# Patient Record
Sex: Female | Born: 1965 | Race: White | Hispanic: No | Smoking: Current every day smoker
Health system: Southern US, Community
[De-identification: ages and names within clinical notes are randomized; demographics above are authoritative.]

## PROBLEM LIST (undated history)

## (undated) DIAGNOSIS — T7840XA Allergy, unspecified, initial encounter: Secondary | ICD-10-CM

## (undated) DIAGNOSIS — H409 Unspecified glaucoma: Secondary | ICD-10-CM

## (undated) DIAGNOSIS — J449 Chronic obstructive pulmonary disease, unspecified: Secondary | ICD-10-CM

## (undated) DIAGNOSIS — M1990 Unspecified osteoarthritis, unspecified site: Secondary | ICD-10-CM

## (undated) DIAGNOSIS — R251 Tremor, unspecified: Secondary | ICD-10-CM

## (undated) HISTORY — DX: Unspecified glaucoma: H40.9

## (undated) HISTORY — PX: FOOT SURGERY: SHX648

## (undated) HISTORY — DX: Chronic obstructive pulmonary disease, unspecified: J44.9

## (undated) HISTORY — DX: Unspecified osteoarthritis, unspecified site: M19.90

## (undated) HISTORY — PX: ABDOMINAL HYSTERECTOMY: SHX81

## (undated) HISTORY — DX: Tremor, unspecified: R25.1

## (undated) HISTORY — PX: FOOT FUSION: SHX956

## (undated) HISTORY — PX: REFRACTIVE SURGERY: SHX103

## (undated) HISTORY — DX: Allergy, unspecified, initial encounter: T78.40XA

## (undated) MED FILL — Medication: Fill #3 | Status: CN

## (undated) MED FILL — Medication: Fill #0 | Status: CN

---

## 2004-05-06 ENCOUNTER — Ambulatory Visit: Payer: Self-pay | Admitting: Internal Medicine

## 2004-09-29 ENCOUNTER — Ambulatory Visit: Payer: Self-pay

## 2004-11-12 ENCOUNTER — Emergency Department: Payer: Self-pay | Admitting: Internal Medicine

## 2004-11-29 ENCOUNTER — Ambulatory Visit: Payer: Self-pay | Admitting: Internal Medicine

## 2006-12-27 ENCOUNTER — Emergency Department: Payer: Self-pay | Admitting: Emergency Medicine

## 2006-12-27 ENCOUNTER — Other Ambulatory Visit: Payer: Self-pay

## 2007-11-07 ENCOUNTER — Ambulatory Visit: Payer: Self-pay | Admitting: Internal Medicine

## 2014-02-03 DIAGNOSIS — R252 Cramp and spasm: Secondary | ICD-10-CM | POA: Insufficient documentation

## 2014-02-03 HISTORY — DX: Cramp and spasm: R25.2

## 2014-09-30 ENCOUNTER — Emergency Department
Admit: 2014-09-30 | Disposition: A | Discharge status: Home / Self Care | Payer: Self-pay | Admitting: Emergency Medicine

## 2014-09-30 LAB — BASIC METABOLIC PANEL WITH GFR
Anion Gap: 8
BUN: 9 mg/dL
Calcium, Total: 9.2 mg/dL
Chloride: 104 mmol/L
Co2: 26 mmol/L
Creatinine: 0.75 mg/dL
EGFR (African American): >60
EGFR (Non-African Amer.): >60
Glucose: 128 mg/dL — ABNORMAL HIGH
Potassium: 3.7 mmol/L
Sodium: 138 mmol/L

## 2014-09-30 LAB — CBC
HCT: 43.1 % (ref 35.0–47.0)
HGB: 14.8 g/dL (ref 12.0–16.0)
MCH: 32.2 pg (ref 26.0–34.0)
MCHC: 34.2 g/dL (ref 32.0–36.0)
MCV: 94 fL (ref 80–100)
PLATELETS: 328 10*3/uL (ref 150–440)
RBC: 4.59 10*6/uL (ref 3.80–5.20)
RDW: 13.4 % (ref 11.5–14.5)
WBC: 9.8 10*3/uL (ref 3.6–11.0)

## 2015-02-11 DIAGNOSIS — F17210 Nicotine dependence, cigarettes, uncomplicated: Secondary | ICD-10-CM | POA: Insufficient documentation

## 2015-02-11 DIAGNOSIS — M1710 Unilateral primary osteoarthritis, unspecified knee: Secondary | ICD-10-CM | POA: Insufficient documentation

## 2015-02-11 DIAGNOSIS — F17200 Nicotine dependence, unspecified, uncomplicated: Secondary | ICD-10-CM | POA: Insufficient documentation

## 2018-01-05 ENCOUNTER — Emergency Department: Payer: Self-pay

## 2018-01-05 ENCOUNTER — Other Ambulatory Visit: Payer: Self-pay

## 2018-01-05 ENCOUNTER — Emergency Department
Admission: EM | Admit: 2018-01-05 | Discharge: 2018-01-05 | Disposition: A | Discharge status: Home / Self Care | Payer: Self-pay | Attending: Emergency Medicine | Admitting: Emergency Medicine

## 2018-01-05 DIAGNOSIS — R630 Anorexia: Secondary | ICD-10-CM | POA: Insufficient documentation

## 2018-01-05 DIAGNOSIS — R109 Unspecified abdominal pain: Secondary | ICD-10-CM | POA: Insufficient documentation

## 2018-01-05 DIAGNOSIS — F17200 Nicotine dependence, unspecified, uncomplicated: Secondary | ICD-10-CM | POA: Insufficient documentation

## 2018-01-05 LAB — URINALYSIS, COMPLETE (UACMP) WITH MICROSCOPIC
Bacteria, UA: NONE SEEN
Glucose, UA: NEGATIVE mg/dL
Hgb urine dipstick: NEGATIVE
Ketones, ur: 5 mg/dL — AB
Nitrite: NEGATIVE
Protein, ur: 30 mg/dL — AB
SPECIFIC GRAVITY, URINE: 1.034 — AB (ref 1.005–1.030)
pH: 5 (ref 5.0–8.0)

## 2018-01-05 LAB — DIFFERENTIAL
Basophils Absolute: 0 10*3/uL (ref 0–0.1)
Basophils Relative: 0 %
Eosinophils Absolute: 0.1 10*3/uL (ref 0–0.7)
Eosinophils Relative: 1 %
LYMPHS ABS: 1.2 10*3/uL (ref 1.0–3.6)
LYMPHS PCT: 10 %
Monocytes Absolute: 0.7 10*3/uL (ref 0.2–0.9)
Monocytes Relative: 6 %
NEUTROS ABS: 10.5 10*3/uL — AB (ref 1.4–6.5)
Neutrophils Relative %: 83 %

## 2018-01-05 LAB — COMPREHENSIVE METABOLIC PANEL
ALBUMIN: 4.2 g/dL (ref 3.5–5.0)
ALT: 19 U/L (ref 0–44)
AST: 22 U/L (ref 15–41)
Alkaline Phosphatase: 68 U/L (ref 38–126)
Anion gap: 10 (ref 5–15)
BUN: 10 mg/dL (ref 6–20)
CALCIUM: 9 mg/dL (ref 8.9–10.3)
CO2: 23 mmol/L (ref 22–32)
Chloride: 107 mmol/L (ref 98–111)
Creatinine, Ser: 0.72 mg/dL (ref 0.44–1.00)
GFR calc Af Amer: >60 mL/min (ref >60)
GFR calc non Af Amer: >60 mL/min (ref >60)
GLUCOSE: 113 mg/dL — AB (ref 70–99)
POTASSIUM: 3.5 mmol/L (ref 3.5–5.1)
Sodium: 140 mmol/L (ref 135–145)
Total Bilirubin: 0.7 mg/dL (ref 0.3–1.2)
Total Protein: 7.1 g/dL (ref 6.5–8.1)

## 2018-01-05 LAB — TROPONIN I

## 2018-01-05 LAB — CBC
HCT: 43.5 % (ref 35.0–47.0)
HEMOGLOBIN: 15.2 g/dL (ref 12.0–16.0)
MCH: 33.2 pg (ref 26.0–34.0)
MCHC: 34.9 g/dL (ref 32.0–36.0)
MCV: 95 fL (ref 80.0–100.0)
Platelets: 326 10*3/uL (ref 150–440)
RBC: 4.58 MIL/uL (ref 3.80–5.20)
RDW: 13.3 % (ref 11.5–14.5)
WBC: 12.7 10*3/uL — ABNORMAL HIGH (ref 3.6–11.0)

## 2018-01-05 LAB — LIPASE, BLOOD: Lipase: 21 U/L (ref 11–51)

## 2018-01-05 MED ORDER — IOPAMIDOL (ISOVUE-300) INJECTION 61%: 30.0000 mL | Freq: Once | INTRAVENOUS | Status: DC | PRN

## 2018-01-05 NOTE — ED Notes (Signed)
FIRST NURSE NOTE:  Pt c/o dehdyration and decreased appetite.  Ambulatory in triage. No distress noted.

## 2018-01-05 NOTE — ED Provider Notes (Addendum)
[facility] Emergency Department Provider Note   ____________________________________________   First MD Initiated Contact with Patient 01/05/18 1134     (approximate)  I have reviewed the triage vital signs and the nursing notes.   HISTORY  Chief Complaint Abdominal Pain   HPI Natasha Hanson is a 52 y.o. female who saw her doctor at [facility] yesterday.  Doctor told her she had some blood in her urine was dehydrated and the patient states she was told she had a gallbladder infection.  The paperwork the patient brought with her does not seem to indicate that though.  Patient reports she is not hungry not eating losing weight feels a little nausea and her belly is diffusely tender on exam.  Tenderness is mild to moderate worse with palpation.  Is been going on for a while.   History reviewed. No pertinent past medical history.  There are no active problems to display for this patient.   Past Surgical History:  Procedure Laterality Date  . ABDOMINAL HYSTERECTOMY    . FOOT SURGERY    . REFRACTIVE SURGERY      Prior to Admission medications   Not on File    Allergies Patient has no known allergies.  History reviewed. No pertinent family history.  Social History Social History   Tobacco Use  . Smoking status: Current Every Day Smoker  Substance Use Topics  . Alcohol use: Never    Frequency: Never  . Drug use: Not on file    Review of Systems  Constitutional: No fever/chills Eyes: No visual changes. ENT: No sore throat. Cardiovascular: Denies chest pain. Respiratory: Denies shortness of breath. Gastrointestinal:  abdominal pain.   nausea, no vomiting.  No diarrhea.  No constipation. Genitourinary: Negative for dysuria. Musculoskeletal: Negative for back pain. Skin: Negative for rash. Neurological: Negative for headaches, focal weakness   ____________________________________________   PHYSICAL EXAM:  VITAL  SIGNS: ED Triage Vitals [01/05/18 1114]  Enc Vitals Group     BP 126/74     Pulse Rate 82     Resp 18     Temp 98.6 F (37 C)     Temp Source Oral     SpO2 98 %     Weight 140 lb (63.5 kg)     Height 5\' 3"  (1.6 m)     Head Circumference      Peak Flow      Pain Score 5     Pain Loc      Pain Edu?      Excl. in GC?     Constitutional: Alert and oriented. Well appearing and in no acute distress. Eyes: Conjunctivae are normal. PER. EOMI. Head: Atraumatic. Nose: No congestion/rhinnorhea. Mouth/Throat: Mucous membranes are moist.  Oropharynx non-erythematous. Neck: No stridor. Cardiovascular: Normal rate, regular rhythm. Grossly normal heart sounds.  Good peripheral circulation. Respiratory: Normal respiratory effort.  No retractions. Lungs CTAB. Gastrointestinal: Soft usually tender to palpation no distention. No abdominal bruits. No CVA tenderness. Musculoskeletal: No lower extremity tenderness nor edema.  No joint effusions. Neurologic:  Normal speech and language. No gross focal neurologic deficits are appreciated. No gait instability. Skin:  Skin is warm, dry and intact. No rash noted. Psychiatric: Mood and affect are normal. Speech and behavior are normal.  ____________________________________________   LABS (all labs ordered are listed, but only abnormal results are displayed)  Labs Reviewed  COMPREHENSIVE METABOLIC PANEL - Abnormal; Notable for the following components:  Result Value   Glucose, Bld 113 (*)    All other components within normal limits  CBC - Abnormal; Notable for the following components:   WBC 12.7 (*)    All other components within normal limits  URINALYSIS, COMPLETE (UACMP) WITH MICROSCOPIC - Abnormal; Notable for the following components:   Color, Urine AMBER (*)    APPearance CLEAR (*)    Specific Gravity, Urine 1.034 (*)    Bilirubin Urine SMALL (*)    Ketones, ur 5 (*)    Protein, ur 30 (*)    Leukocytes, UA TRACE (*)    All other  components within normal limits  DIFFERENTIAL - Abnormal; Notable for the following components:   Neutro Abs 10.5 (*)    All other components within normal limits  LIPASE, BLOOD  TROPONIN I   ____________________________________________  EKG EKG read and interpreted by me shows 5 there is some ST flattening inferiorly and in the chest leads.  Patient does not have symptoms of increasing pain or shortness of breath with exertion.  Do not have any old EKGs earlier than 2008.  EKG from 2008 is different from today's.  ____________________________________________  RADIOLOGY  ED MD interpretation: X-ray read by radiology reviewed by me shows no acute disease CT shows no obvious reason for the patient's abdominal pain.  Official radiology report(s): Ct Abdomen Pelvis Wo Contrast  Result Date: 01/05/2018 CLINICAL DATA:  52 year old female with acute abdominal and pelvic pain for several days. EXAM: CT ABDOMEN AND PELVIS WITHOUT CONTRAST TECHNIQUE: Multidetector CT imaging of the abdomen and pelvis was performed following the standard protocol without IV contrast. COMPARISON:  None. FINDINGS: Please note that parenchymal abnormalities may be missed without intravenous contrast. Lower chest: No acute abnormality. Hepatobiliary: The liver and gallbladder are unremarkable. No biliary dilatation. Pancreas: Unremarkable Spleen: Unremarkable Adrenals/Urinary Tract: The kidneys, adrenal glands and bladder are unremarkable. Stomach/Bowel: Stomach is within normal limits. Appendix appears normal. No evidence of bowel wall thickening, distention, or inflammatory changes. Vascular/Lymphatic: Aortic atherosclerosis. No enlarged abdominal or pelvic lymph nodes. Reproductive: Status post hysterectomy. No adnexal masses. Other: No ascites, focal collection or pneumoperitoneum. Musculoskeletal: No acute or significant osseous findings. IMPRESSION: 1. No acute abnormality or CT findings to suggest a cause for this  patient's abdominal pain. 2.  Aortic Atherosclerosis (ICD10-I70.0). Electronically Signed   By: Harmon PierJeffrey  Hu M.D.   On: 01/05/2018 14:20   Dg Chest 2 View  Result Date: 01/05/2018 CLINICAL DATA:  Productive cough for 2 days. EXAM: CHEST - 2 VIEW COMPARISON:  12/27/2006 radiograph FINDINGS: The cardiomediastinal silhouette is unremarkable. Mild chronic peribronchial thickening/COPD changes noted. There is no evidence of focal airspace disease, pulmonary edema, suspicious pulmonary nodule/mass, pleural effusion, or pneumothorax. No acute bony abnormalities are identified. IMPRESSION: 1. No evidence of acute cardiopulmonary disease 2. Mild chronic peribronchial thickening/COPD changes. Electronically Signed   By: Harmon PierJeffrey  Hu M.D.   On: 01/05/2018 13:27    ____________________________________________   PROCEDURES  Procedure(s) performed:   Procedures  Critical Care performed:  ____________________________________________   INITIAL IMPRESSION / ASSESSMENT AND PLAN / ED COURSE  Patient refuses further attempts at IV sticks.  She is aware that we will get a suboptimal study without the IV contrast.  She also says she cannot drink anymore contrast.  Patient has a slightly elevated white count mild diffuse abdominal pain.  After the limit of testing that the patient will allow us to do I cannot find any cause for her abdominal pain or her lack  of willingness to eat.  I will send her back to her regular doctor recommend possible GI follow-up.  Patient to return if she is worse.  I need to add that none of the patient's symptoms are worsened with exertion as it would be if they were due to any cardiac problems.  Clinical Course as of Jan 05 2033  Sat Jan 05, 2018  1448 BUN: 10 [PM]    Clinical Course User Index [PM] Arnaldo Natal, MD     ____________________________________________   FINAL CLINICAL IMPRESSION(S) / ED DIAGNOSES  Final diagnoses:  Abdominal pain, unspecified abdominal  location     ED Discharge Orders    None       Note:  This document was prepared using Dragon voice recognition software and may include unintentional dictation errors.    Arnaldo Natal, MD 01/05/18 1540    Arnaldo Natal, MD 01/05/18 2035

## 2018-01-05 NOTE — ED Notes (Signed)
Patient refused 2nd IV attempt and states she can't finish PO contrast. Patient has drank 3/4 of first bottle. CT scan tech aware.

## 2018-01-05 NOTE — ED Triage Notes (Signed)
Pt arrives to ED stating that she saw MD yesterday who stated that she has a "gallbladder infection", dehydration, and hematuria. Pt states she doesn't want to drink anything. C/o lower abd. Denies N&V&D&fever. Alert, oriented, ambulatory. Pt c/o dysuria. Started antibiotics yesterday.

## 2018-01-05 NOTE — ED Notes (Signed)
Pt given urine specimen cup and wipe. Informed to bring cup to front desk if urinates while waiting for room. Stated she did not need to urinate at this time.

## 2018-01-05 NOTE — Discharge Instructions (Addendum)
Please take all your medicines as instructed by your doctor.  Please follow-up with your doctor on Monday.  Please return here for worse pain fever vomiting sicker.  Your doctor may want you to follow-up with gastroenterology as well.

## 2018-02-06 ENCOUNTER — Encounter: Payer: Self-pay | Admitting: Emergency Medicine

## 2018-02-06 ENCOUNTER — Emergency Department
Admission: EM | Admit: 2018-02-06 | Discharge: 2018-02-06 | Disposition: A | Discharge status: Home / Self Care | Payer: Self-pay | Attending: Emergency Medicine | Admitting: Emergency Medicine

## 2018-02-06 DIAGNOSIS — F17210 Nicotine dependence, cigarettes, uncomplicated: Secondary | ICD-10-CM | POA: Insufficient documentation

## 2018-02-06 DIAGNOSIS — B070 Plantar wart: Secondary | ICD-10-CM | POA: Insufficient documentation

## 2018-02-06 MED ORDER — MELOXICAM 15 MG PO TABS: 15.0000 mg | ORAL_TABLET | Freq: Every day | ORAL | 0 refills | Status: DC

## 2018-02-06 MED ORDER — SALICYLIC ACID 27.5 % EX LIQD: 1.0000 "application " | Freq: Two times a day (BID) | CUTANEOUS | 2 refills | Status: AC

## 2018-02-06 NOTE — ED Provider Notes (Signed)
[facility] Emergency Department Provider Note  ____________________________________________  Time seen: Approximately 3:38 PM  I have reviewed the triage vital signs and the nursing notes.   HISTORY  Chief Complaint Foot Pain    HPI Natasha Hanson is a 52 y.o. female who presents the emergency department complaining of  right foot pain.  Patient reports that she has a "bone" poking out the bottom of her foot.  Patient reports that she had similar symptoms to her left foot approximately 20 years ago and had "cut it out."  Patient denies any trauma.  She denies any erythema or edema.  She reports that it is very painful when standing for long periods of time.  She works at [employer] and is concerned that she has to work at the [employer] and is afraid that standing for long periods of time would be unbearable.  No other injury or complaint.  No medications for this complaint prior to arrival.   History reviewed. No pertinent past medical history.  There are no active problems to display for this patient.   Past Surgical History:  Procedure Laterality Date  . ABDOMINAL HYSTERECTOMY    . FOOT SURGERY    . REFRACTIVE SURGERY      Prior to Admission medications   Medication Sig Start Date End Date Taking? Authorizing Provider  meloxicam (MOBIC) 15 MG tablet Take 1 tablet (15 mg total) by mouth daily. 02/06/18   Cuthriell, Delorise Royals, PA-C  Salicylic Acid 27.5 % LIQD Apply 1 application topically 2 (two) times daily. Apply for 4-6 weeks until area shows complete resolution 02/06/18 03/20/18  Cuthriell, Delorise Royals, PA-C    Allergies Patient has no known allergies.  No family history on file.  Social History Social History   Tobacco Use  . Smoking status: Current Every Day Smoker  Substance Use Topics  . Alcohol use: Never    Frequency: Never  . Drug use: Not on file     Review of Systems  Constitutional: No fever/chills Eyes: No visual  changes.  Cardiovascular: no chest pain. Respiratory: no cough. No SOB. Gastrointestinal: No abdominal pain.  No nausea, no vomiting.   Musculoskeletal: Positive for right foot pain Skin: Negative for rash, abrasions, lacerations, ecchymosis. Neurological: Negative for headaches, focal weakness or numbness. 10-point ROS otherwise negative.  ____________________________________________   PHYSICAL EXAM:  VITAL SIGNS: ED Triage Vitals  Enc Vitals Group     BP 02/06/18 1404 122/61     Pulse Rate 02/06/18 1404 72     Resp 02/06/18 1404 18     Temp 02/06/18 1404 98.7 F (37.1 C)     Temp Source 02/06/18 1404 Oral     SpO2 02/06/18 1404 99 %     Weight 02/06/18 1401 140 lb (63.5 kg)     Height 02/06/18 1401 5\' 2"  (1.575 m)     Head Circumference --      Peak Flow --      Pain Score 02/06/18 1401 9     Pain Loc --      Pain Edu? --      Excl. in GC? --      Constitutional: Alert and oriented. Well appearing and in no acute distress. Eyes: Conjunctivae are normal. PERRL. EOMI. Head: Atraumatic. Neck: No stridor.    Cardiovascular: Normal rate, regular rhythm. Normal S1 and S2.  Good peripheral circulation. Respiratory: Normal respiratory effort without tachypnea or retractions. Lungs CTAB. Good air entry to the bases  with no decreased or absent breath sounds. Musculoskeletal: Full range of motion to all extremities. No gross deformities appreciated.  Visualization of the right foot reveals no visible edema, erythema, deformity.  Visualization of the plantar aspect of the right foot reveals skin findings consistent with plantar wart.  No other visible surrounding erythema or edema concerning for infection.  Palpation reveals no osseous abnormality.  Palpation of affected area is firm consistent with plantar wart. Neurologic:  Normal speech and language. No gross focal neurologic deficits are appreciated.  Skin:  Skin is warm, dry and intact. No rash noted. Psychiatric: Mood and  affect are normal. Speech and behavior are normal. Patient exhibits appropriate insight and judgement.   ____________________________________________   LABS (all labs ordered are listed, but only abnormal results are displayed)  Labs Reviewed - No data to display ____________________________________________  EKG   ____________________________________________  RADIOLOGY   No results found.  ____________________________________________    PROCEDURES  Procedure(s) performed:    Procedures    Medications - No data to display   ____________________________________________   INITIAL IMPRESSION / ASSESSMENT AND PLAN / ED COURSE  Pertinent labs & imaging results that were available during my care of the patient were reviewed by me and considered in my medical decision making (see chart for details).  Review of the [place] CSRS was performed in accordance of the [facility] prior to dispensing any controlled drugs.      Patient's diagnosis is consistent with plantar wart.  Patient presents the emergency department with right foot pain.  Patient was concerned that she had a bone that was going to rupture through the skin.  No evidence of acute osseous abnormality.  Skin findings are consistent with plantar wart.  Patient is instructed to use moleskin for improvement while walking.. Patient will be discharged home with prescriptions for meloxicam and salicylic acid. Patient is to follow up with podiatry as needed or otherwise directed. Patient is given ED precautions to return to the ED for any worsening or new symptoms.     ____________________________________________  FINAL CLINICAL IMPRESSION(S) / ED DIAGNOSES  Final diagnoses:  Plantar wart      NEW MEDICATIONS STARTED DURING THIS VISIT:  ED Discharge Orders         Ordered    meloxicam (MOBIC) 15 MG tablet  Daily     02/06/18 1545    Salicylic Acid 27.5 % LIQD  2 times daily     02/06/18 1545               This chart was dictated using voice recognition software/Dragon. Despite best efforts to proofread, errors can occur which can change the meaning. Any change was purely unintentional.    Racheal PatchesCuthriell, Jonathan D, PA-C 02/06/18 1546    Phineas SemenGoodman, Graydon, MD 02/06/18 1600

## 2018-02-06 NOTE — ED Notes (Signed)
Pt states that her right foot has been hurting her. States that "theres a bone in my foot that is gonna puncture the skin". She says the same thing happened to her other foot about 10 years ago.

## 2018-02-06 NOTE — ED Triage Notes (Signed)
Pt reports pain to left foot for 3 days. Pt denies injuries. Pt reports did have surgery on her other foot. Pt repots has to work tomorrow and it hurts to walk on it.

## 2018-07-09 ENCOUNTER — Other Ambulatory Visit: Payer: Self-pay | Admitting: Family

## 2018-07-09 DIAGNOSIS — Z1231 Encounter for screening mammogram for malignant neoplasm of breast: Secondary | ICD-10-CM

## 2018-10-16 ENCOUNTER — Ambulatory Visit
Admission: RE | Admit: 2018-10-16 | Discharge: 2018-10-16 | Disposition: A | Discharge status: Home / Self Care | Payer: BLUE CROSS/BLUE SHIELD | Attending: Family | Admitting: Family

## 2018-10-16 ENCOUNTER — Other Ambulatory Visit: Payer: Self-pay

## 2018-10-16 ENCOUNTER — Ambulatory Visit
Admission: RE | Admit: 2018-10-16 | Discharge: 2018-10-16 | Disposition: A | Discharge status: Home / Self Care | Payer: BLUE CROSS/BLUE SHIELD | Source: Ambulatory Visit | Attending: Family | Admitting: Family

## 2018-10-16 ENCOUNTER — Other Ambulatory Visit: Payer: Self-pay | Admitting: Family

## 2018-10-16 DIAGNOSIS — J189 Pneumonia, unspecified organism: Secondary | ICD-10-CM | POA: Insufficient documentation

## 2018-10-16 DIAGNOSIS — R05 Cough: Secondary | ICD-10-CM | POA: Diagnosis present

## 2018-11-12 DIAGNOSIS — R251 Tremor, unspecified: Secondary | ICD-10-CM | POA: Insufficient documentation

## 2018-12-26 ENCOUNTER — Other Ambulatory Visit: Payer: Self-pay | Admitting: Family

## 2018-12-26 DIAGNOSIS — Z1231 Encounter for screening mammogram for malignant neoplasm of breast: Secondary | ICD-10-CM

## 2019-02-04 ENCOUNTER — Other Ambulatory Visit: Payer: Self-pay | Admitting: Neurology

## 2019-02-15 ENCOUNTER — Other Ambulatory Visit: Payer: Self-pay

## 2019-02-15 ENCOUNTER — Emergency Department
Admission: EM | Admit: 2019-02-15 | Discharge: 2019-02-15 | Disposition: A | Discharge status: Home / Self Care | Payer: BC Managed Care – PPO | Attending: Emergency Medicine | Admitting: Emergency Medicine

## 2019-02-15 DIAGNOSIS — R251 Tremor, unspecified: Secondary | ICD-10-CM

## 2019-02-15 DIAGNOSIS — F17210 Nicotine dependence, cigarettes, uncomplicated: Secondary | ICD-10-CM | POA: Insufficient documentation

## 2019-02-15 LAB — COMPREHENSIVE METABOLIC PANEL
ALT: 14 U/L (ref 0–44)
AST: 19 U/L (ref 15–41)
Albumin: 4.6 g/dL (ref 3.5–5.0)
Alkaline Phosphatase: 70 U/L (ref 38–126)
Anion gap: 10 (ref 5–15)
BUN: 14 mg/dL (ref 6–20)
CO2: 26 mmol/L (ref 22–32)
Calcium: 9.5 mg/dL (ref 8.9–10.3)
Chloride: 102 mmol/L (ref 98–111)
Creatinine, Ser: 0.71 mg/dL (ref 0.44–1.00)
GFR calc Af Amer: >60 mL/min (ref >60)
GFR calc non Af Amer: >60 mL/min (ref >60)
Glucose, Bld: 77 mg/dL (ref 70–99)
Potassium: 4.2 mmol/L (ref 3.5–5.1)
Sodium: 138 mmol/L (ref 135–145)
Total Bilirubin: 0.5 mg/dL (ref 0.3–1.2)
Total Protein: 8.1 g/dL (ref 6.5–8.1)

## 2019-02-15 LAB — CBC WITH DIFFERENTIAL/PLATELET
Abs Immature Granulocytes: 0.02 10*3/uL (ref 0.00–0.07)
Basophils Absolute: 0 10*3/uL (ref 0.0–0.1)
Basophils Relative: 0 %
Eosinophils Absolute: 0.2 10*3/uL (ref 0.0–0.5)
Eosinophils Relative: 2 %
HCT: 45.7 % (ref 36.0–46.0)
Hemoglobin: 15.5 g/dL — ABNORMAL HIGH (ref 12.0–15.0)
Immature Granulocytes: 0 %
Lymphocytes Relative: 55 %
Lymphs Abs: 5.3 10*3/uL — ABNORMAL HIGH (ref 0.7–4.0)
MCH: 31.9 pg (ref 26.0–34.0)
MCHC: 33.9 g/dL (ref 30.0–36.0)
MCV: 94 fL (ref 80.0–100.0)
Monocytes Absolute: 0.8 10*3/uL (ref 0.1–1.0)
Monocytes Relative: 8 %
Neutro Abs: 3.4 10*3/uL (ref 1.7–7.7)
Neutrophils Relative %: 35 %
Platelets: 372 10*3/uL (ref 150–400)
RBC: 4.86 MIL/uL (ref 3.87–5.11)
RDW: 13.1 % (ref 11.5–15.5)
WBC: 9.8 10*3/uL (ref 4.0–10.5)
nRBC: 0 % (ref 0.0–0.2)

## 2019-02-15 LAB — TSH: TSH: 0.967 u[IU]/mL (ref 0.350–4.500)

## 2019-02-15 NOTE — ED Provider Notes (Signed)
[facility] Emergency Department Provider Note  ____________________________________________   First MD Initiated Contact with Patient 02/15/19 1431     (approximate)  I have reviewed the triage vital signs and the nursing notes.   HISTORY  Chief Complaint Neck Pain and Shoulder Pain    HPI Natasha Hanson is a 53 y.o. female presents emergency department complaining of severe head and neck tremors.  She states is making her shake all over.  She had an appointment with [name] at [facility] he was going to order a MRI but the MRI schedule is 3 to 4 months out.  She states tremors have gotten much worse and she is unable to perform her job.  She states she has chronic neck and shoulder pain from a fall years ago.  She does take hydrocodone, gabapentin, and anti-inflammatory.  She has well water and has not had the water checked recently.  She denies any head injury.  She denies chest pain, shortness of breath, fever, chills or any other illness at this time.    No past medical history on file.  There are no active problems to display for this patient.   Past Surgical History:  Procedure Laterality Date  . ABDOMINAL HYSTERECTOMY    . FOOT SURGERY    . REFRACTIVE SURGERY      Prior to Admission medications   Medication Sig Start Date End Date Taking? Authorizing Provider  meloxicam (MOBIC) 15 MG tablet Take 1 tablet (15 mg total) by mouth daily. 02/06/18   Cuthriell, Charline Bills, PA-C    Allergies Patient has no known allergies.  No family history on file.  Social History Social History   Tobacco Use  . Smoking status: Current Every Day Smoker  Substance Use Topics  . Alcohol use: Never    Frequency: Never  . Drug use: Not on file    Review of Systems  Constitutional: No fever/chills Eyes: No visual changes. ENT: No sore throat. Respiratory: Denies cough Genitourinary: Negative for dysuria. Musculoskeletal: Negative for back  pain. Neurological: Positive for tremors Skin: Negative for rash.    ____________________________________________   PHYSICAL EXAM:  VITAL SIGNS: ED Triage Vitals  Enc Vitals Group     BP 02/15/19 1309 113/62     Pulse Rate 02/15/19 1309 73     Resp 02/15/19 1309 18     Temp 02/15/19 1309 98.1 F (36.7 C)     Temp Source 02/15/19 1309 Oral     SpO2 02/15/19 1309 98 %     Weight 02/15/19 1307 125 lb (56.7 kg)     Height 02/15/19 1307 _0  (1.6 m)     Head Circumference --      Peak Flow --      Pain Score 02/15/19 1306 6     Pain Loc --      Pain Edu? --      Excl. in [place]? --     Constitutional: Alert and oriented. Well appearing and in no acute distress. Eyes: Conjunctivae are normal.  Head: Atraumatic. Nose: No congestion/rhinnorhea. Mouth/Throat: Mucous membranes are moist.   Neck:  supple no lymphadenopathy noted Cardiovascular: Normal rate, regular rhythm. Heart sounds are normal Respiratory: Normal respiratory effort.  No retractions, lungs c t a  GU: deferred Musculoskeletal: FROM all extremities, warm and well perfused Neurologic:  Normal speech and language.  Severe tremor noted along the head neck region, grips equal bilaterally Skin:  Skin is warm, dry and intact.  No rash noted. Psychiatric: Mood and affect are normal. Speech and behavior are normal.  ____________________________________________   LABS (all labs ordered are listed, but only abnormal results are displayed)  Labs Reviewed  CBC WITH DIFFERENTIAL/PLATELET - Abnormal; Notable for the following components:      Result Value   Hemoglobin 15.5 (*)    Lymphs Abs 5.3 (*)    All other components within normal limits  COMPREHENSIVE METABOLIC PANEL  TSH  HEAVY METALS, BLOOD   ____________________________________________   ____________________________________________  RADIOLOGY  MRI of the brain with and without contrast; patient is unwilling to wait and would like to be discharged.   ____________________________________________   PROCEDURES  Procedure(s) performed: No  Procedures    ____________________________________________   INITIAL IMPRESSION / ASSESSMENT AND PLAN / ED COURSE  Pertinent labs & imaging results that were available during my care of the patient were reviewed by me and considered in my medical decision making (see chart for details).   Patient is a 53 year old female presents to the emergency department with complaints of severe and worsening tremors of the head and neck.  Patient does have well water so there could be a concern for heavy metals.  Physical exam shows patient is unable control neck and head movements.  Seems to be a severe tremor at this time.  Remainder the exam is unremarkable  CBC, met C, TSH, heavy metals, MRI of the brain with and without contrast ordered   CBC, met C, and TSH are normal.  Heavy metals test is pending.  ----------------------------------------- 5:29 PM on 02/15/2019 -----------------------------------------  Patient notified the nurse that she does not want to wait the 5 or 6 hours for her MRI and will have her doctor order 1 for her.  She was discharged in stable condition.  Natasha Hanson was evaluated in Emergency Department on 02/15/2019 for the symptoms described in the history of present illness. She was evaluated in the context of the global COVID-19 pandemic, which necessitated consideration that the patient might be at risk for infection with the SARS-CoV-2 virus that causes COVID-19. Institutional protocols and algorithms that pertain to the evaluation of patients at risk for COVID-19 are in a state of rapid change based on information released by regulatory bodies including the CDC and federal and state organizations. These policies and algorithms were followed during the patient's care in the ED.   As part of my medical decision making, I reviewed the following data within the electronic medical  record:  Nursing notes reviewed and incorporated, Labs reviewed see above, Old chart reviewed, Notes from prior ED visits and [place] Controlled Substance Database  ____________________________________________   FINAL CLINICAL IMPRESSION(S) / ED DIAGNOSES  Final diagnoses:  Tremor      NEW MEDICATIONS STARTED DURING THIS VISIT:  New Prescriptions   No medications on file     Note:  This document was prepared using Dragon voice recognition software and may include unintentional dictation errors.    Versie Starks, PA-C 02/15/19 1729    Nena Polio, MD 02/15/19 2113

## 2019-02-15 NOTE — Discharge Instructions (Addendum)
Follow-up with [name].  Please call for an appointment.  He can also look up your heavy metals test when this is resulted.  Today your thyroid is normal, your blood cell counts are normal, and your metabolic panel is normal.

## 2019-02-15 NOTE — ED Triage Notes (Addendum)
Pt arrived via POV with reports of neck and shoulder from fall years ago.  Pt states she has been seen by neurology and has MRI ordered, but states they are 3-4 months out from doing it.   Pt takes hydrocodone, last dose around 0930.  Pt states she is here to see about getting MRI done and has called out of work 2 days in a row.

## 2019-02-19 LAB — HEAVY METALS, BLOOD
Arsenic: 6 ug/L (ref 2–23)
Lead: 2 ug/dL (ref 0–4)
Mercury: <1 ug/L (ref 0.0–14.9)

## 2019-02-20 ENCOUNTER — Encounter: Payer: Self-pay | Admitting: Emergency Medicine

## 2019-02-20 ENCOUNTER — Emergency Department: Payer: BC Managed Care – PPO

## 2019-02-20 ENCOUNTER — Emergency Department
Admission: EM | Admit: 2019-02-20 | Discharge: 2019-02-20 | Disposition: A | Discharge status: Home / Self Care | Payer: BC Managed Care – PPO | Attending: Emergency Medicine | Admitting: Emergency Medicine

## 2019-02-20 ENCOUNTER — Other Ambulatory Visit: Payer: Self-pay

## 2019-02-20 DIAGNOSIS — Z79899 Other long term (current) drug therapy: Secondary | ICD-10-CM | POA: Diagnosis not present

## 2019-02-20 DIAGNOSIS — F17210 Nicotine dependence, cigarettes, uncomplicated: Secondary | ICD-10-CM | POA: Diagnosis not present

## 2019-02-20 DIAGNOSIS — R251 Tremor, unspecified: Secondary | ICD-10-CM

## 2019-02-20 DIAGNOSIS — R51 Headache: Secondary | ICD-10-CM | POA: Diagnosis present

## 2019-02-20 LAB — VITAMIN B12: Vitamin B-12: 241 pg/mL (ref 180–914)

## 2019-02-20 LAB — C-REACTIVE PROTEIN: CRP: <0.8 mg/dL (ref <1.0)

## 2019-02-20 LAB — SEDIMENTATION RATE: Sed Rate: 15 mm/hr (ref 0–30)

## 2019-02-20 MED ORDER — BACLOFEN 5 MG PO TABS: 5.0000 mg | ORAL_TABLET | Freq: Two times a day (BID) | ORAL | 1 refills | Status: DC

## 2019-02-20 NOTE — Discharge Instructions (Addendum)
Please follow-up with your doctor tomorrow as planned and with the outpatient neurologist later.  Take the baclofen 1 pill twice a day to see if that helps with the tremor.  The MRI of the brain did not show anything bad going on.  The thoracic-spine shows a herniated disc.  Please follow-up with Dr. Cari Caraway the neurosurgeon.  Please return if you have any new or worsening problems.

## 2019-02-20 NOTE — Consult Note (Signed)
Reason for Consult:Tremor Referring Physician: Cinda Quest  CC: Tremor  HPI: Natasha Hanson is an 53 y.o. female who reports that she has had a head tremor for the past year that has been progressively worsening.  She has now started to have right shoulder pain and right neck pain associated with the tremor.  Can only get the tremor to stop by lying down.  Has been followed outpatient and tried on multiple medications with no improvement.  Was recently seen in the ED and started on Propanolol.  Over the past 2 days has had the development of sharp shooting pains in her forehead on the right and in the back of her neck on the right.  These seem to be an issue when her tremor gets particularly bad.  She has had no change in vision.  No extremity numbness or weakness.   Patient had a fall with injury to her neck and back a few years ago.    History reviewed. No pertinent past medical history.  Past Surgical History:  Procedure Laterality Date  . ABDOMINAL HYSTERECTOMY    . FOOT SURGERY    . REFRACTIVE SURGERY      Family history: No known family history of tremor.  Father died of alcoholism.  Mother died of cancer.    Social History:  reports that she has been smoking. She has never used smokeless tobacco. She reports that she does not drink alcohol. No history on file for drug.  No Known Allergies  Medications: I have reviewed the patient's current medications. Prior to Admission medications   Medication Sig Start Date End Date Taking? Authorizing Provider  meloxicam (MOBIC) 15 MG tablet Take 1 tablet (15 mg total) by mouth daily. 02/06/18   Cuthriell, Charline Bills, PA-C     ROS: History obtained from the patient  General ROS: negative for - chills, fatigue, fever, night sweats, weight gain or weight loss Psychological ROS: negative for - behavioral disorder, hallucinations, memory difficulties, mood swings or suicidal ideation Ophthalmic ROS: negative for - blurry vision, double vision,  eye pain or loss of vision ENT ROS: negative for - epistaxis, nasal discharge, oral lesions, sore throat, tinnitus or vertigo Allergy and Immunology ROS: negative for - hives or itchy/watery eyes Hematological and Lymphatic ROS: negative for - bleeding problems, bruising or swollen lymph nodes Endocrine ROS: negative for - galactorrhea, hair pattern changes, polydipsia/polyuria or temperature intolerance Respiratory ROS: negative for - cough, hemoptysis, shortness of breath or wheezing Cardiovascular ROS: negative for - chest pain, dyspnea on exertion, edema or irregular heartbeat Gastrointestinal ROS: negative for - abdominal pain, diarrhea, hematemesis, nausea/vomiting or stool incontinence Genito-Urinary ROS: negative for - dysuria, hematuria, incontinence or urinary frequency/urgency Musculoskeletal ROS: as noted in HPI Neurological ROS: as noted in HPI Dermatological ROS: negative for rash and skin lesion changes  Physical Examination: Blood pressure 112/75, pulse 62, temperature 99.1 F (37.3 C), temperature source Oral, resp. rate 16, height _0  (1.6 m), weight 56.7 kg, SpO2 100 %.  HEENT-  Normocephalic, no lesions, without obvious abnormality.  Normal external eye and conjunctiva.  Normal TM's bilaterally.  Normal auditory canals and external ears. Normal external nose, mucus membranes and septum.  Normal pharynx. Cardiovascular- S1, S2 normal, pulses palpable throughout   Lungs- chest clear, no wheezing, rales, normal symmetric air entry Abdomen- soft, non-tender; bowel sounds normal; no masses,  no organomegaly Extremities- no edema Lymph-no adenopathy palpable Musculoskeletal-Pain on palpation of the right shoulder and paraspinal musculature.  Pain on palpation  of the right occipital ridge.  No temple tenderness on palpation.   Skin-warm and dry, no hyperpigmentation, vitiligo, or suspicious lesions  Neurological Examination   Mental Status: Alert, oriented, thought content  appropriate.  Speech fluent without evidence of aphasia.  Able to follow 3 step commands without difficulty. Cranial Nerves: II: Discs flat bilaterally; Visual fields grossly normal, pupils equal, round, reactive to light and accommodation III,IV, VI: ptosis not present, extra-ocular motions intact bilaterally V,VII: smile symmetric, facial light touch sensation normal bilaterally VIII: hearing normal bilaterally IX,X: gag reflex present XI: bilateral shoulder shrug XII: midline tongue extension Motor: Right : Upper extremity   5/5    Left:     Upper extremity   5/5  Lower extremity   5/5     Lower extremity   5/5 Tone and bulk:normal tone throughout; no atrophy noted Sensory: Pinprick and light touch intact throughout, bilaterally Deep Tendon Reflexes: Symmetric throughout Plantars: Right: downgoing   Left: downgoing Cerebellar: Normal finger-to-nose and normal heel-to-shin testing bilaterally.  Mixed head tremor.   Gait: normal gait and station    Laboratory Studies:   Basic Metabolic Panel: Recent Labs  Lab 02/15/19 1559  NA 138  K 4.2  CL 102  CO2 26  GLUCOSE 77  BUN 14  CREATININE 0.71  CALCIUM 9.5    Liver Function Tests: Recent Labs  Lab 02/15/19 1559  AST 19  ALT 14  ALKPHOS 70  BILITOT 0.5  PROT 8.1  ALBUMIN 4.6   No results for input(s): LIPASE, AMYLASE in the last 168 hours. No results for input(s): AMMONIA in the last 168 hours.  CBC: Recent Labs  Lab 02/15/19 1559  WBC 9.8  NEUTROABS 3.4  HGB 15.5*  HCT 45.7  MCV 94.0  PLT 372    Cardiac Enzymes: No results for input(s): CKTOTAL, CKMB, CKMBINDEX, TROPONINI in the last 168 hours.  BNP: Invalid input(s): POCBNP  CBG: No results for input(s): GLUCAP in the last 168 hours.  Microbiology: No results found for this or any previous visit.  Coagulation Studies: No results for input(s): LABPROT, INR in the last 72 hours.  Urinalysis: No results for input(s): COLORURINE, LABSPEC,  PHURINE, GLUCOSEU, HGBUR, BILIRUBINUR, KETONESUR, PROTEINUR, UROBILINOGEN, NITRITE, LEUKOCYTESUR in the last 168 hours.  Invalid input(s): APPERANCEUR  Lipid Panel:  No results found for: CHOL, TRIG, HDL, CHOLHDL, VLDL, LDLCALC  HgbA1C: No results found for: HGBA1C  Urine Drug Screen:  No results found for: LABOPIA, COCAINSCRNUR, LABBENZ, AMPHETMU, THCU, LABBARB  Alcohol Level: No results for input(s): ETH in the last 168 hours.   Imaging: Mr Brain Wo Contrast  Result Date: 02/20/2019 CLINICAL DATA:  Worsening tremor.  Headache EXAM: MRI HEAD WITHOUT CONTRAST TECHNIQUE: Multiplanar, multiecho pulse sequences of the brain and surrounding structures were obtained without intravenous contrast. COMPARISON:  None. FINDINGS: Brain: No acute infarction, hemorrhage, hydrocephalus, extra-axial collection or mass lesion. Normal white matter. Vascular: Normal arterial flow voids. Skull and upper cervical spine: Negative Sinuses/Orbits: Negative Other: None IMPRESSION: Negative MRI brain. Electronically Signed   By: Franchot Gallo M.D.   On: 02/20/2019 14:58     Assessment/Plan: 43 year olds female presenting with tremor and headache.  Has now had multiple ED visits for the same.  Has an outpatient neurology appointment scheduled.  Today reports right neck and shoulder pain as well as right sided headache related to tremor.  MRI of the brain reviewed and shows no acute changes.  With pain being so focal can not rule out  a component of dystonia with her tremor.  TSH normal.  Heavy metal screen unremarkable.    Recommendations: 1. MRI of the cervical spine 2. ESR, CRP, B12 2. If MRI shows no spinal cord etiology would start patient on Baclofen 32m BID.  Patient scheduled to see outpatient MD tomorrow.  Neurology evaluation as an outpatient is pending.     LAlexis Goodell MD Neurology [phone]/03/2019, 3:04 PM

## 2019-02-20 NOTE — ED Notes (Signed)
EDP at bedside  

## 2019-02-20 NOTE — ED Triage Notes (Signed)
Tremors x1 year, headache /lightheaded x2 days . Was to see a neurologist to have MRI , but was instructed to come here. Pt ambulatory with a slow steady gait

## 2019-02-20 NOTE — ED Provider Notes (Signed)
[facility] Emergency Department Provider Note   ____________________________________________   First MD Initiated Contact with Patient 02/20/19 1258     (approximate)  I have reviewed the triage vital signs and the nursing notes.   HISTORY  Chief Complaint Headache    HPI Natasha Hanson is a 53 y.o. female patient comes in complaining of right-sided headache starting frontal area on the right and then moved going around and over to the occiput on the right.  Feels like a start sharp stabbing pain and lasted an hour and goes away and recurs frequently.  Patient is taking gabapentin but is not helping.  Additionally patient reports tremor of the head and neck which seems to be getting worse slowly.         History reviewed. No pertinent past medical history.  There are no active problems to display for this patient.   Past Surgical History:  Procedure Laterality Date   ABDOMINAL HYSTERECTOMY     FOOT SURGERY     REFRACTIVE SURGERY      Prior to Admission medications   Medication Sig Start Date End Date Taking? Authorizing Provider  Baclofen 5 MG TABS Take 5 mg by mouth 2 (two) times daily. 02/20/19   Nena Polio, MD  meloxicam (MOBIC) 15 MG tablet Take 1 tablet (15 mg total) by mouth daily. 02/06/18   Cuthriell, Charline Bills, PA-C    Allergies Patient has no known allergies.  No family history on file.  Social History Social History   Tobacco Use   Smoking status: Current Every Day Smoker   Smokeless tobacco: Never Used  Substance Use Topics   Alcohol use: Never    Frequency: Never   Drug use: Not on file    Review of Systems  Constitutional: No fever/chills Eyes: No visual changes. ENT: No sore throat. Cardiovascular: Denies chest pain. Respiratory: Denies shortness of breath. Gastrointestinal: No abdominal pain.  No nausea, no vomiting.  No diarrhea.  No constipation. Genitourinary: Negative for  dysuria. Musculoskeletal: Negative for back pain. Skin: Negative for rash. Neurological: Negative for  focal weakness   ____________________________________________   PHYSICAL EXAM:  VITAL SIGNS: ED Triage Vitals  Enc Vitals Group     BP 02/20/19 1044 117/68     Pulse Rate 02/20/19 1044 62     Resp 02/20/19 1044 18     Temp 02/20/19 1044 99.1 F (37.3 C)     Temp Source 02/20/19 1044 Oral     SpO2 02/20/19 1044 98 %     Weight 02/20/19 1045 125 lb (56.7 kg)     Height 02/20/19 1045 5\' 3"  (1.6 m)     Head Circumference --      Peak Flow --      Pain Score 02/20/19 1044 5     Pain Loc --      Pain Edu? --      Excl. in [place]? --     Constitutional: Alert and oriented. Well appearing and in no acute distress. Eyes: Conjunctivae are normal.  Head: Atraumatic. Nose: No congestion/rhinnorhea. Mouth/Throat: Mucous membranes are moist.  Oropharynx non-erythematous. Neck: No stridor.   Cardiovascular: Normal rate, regular rhythm. Grossly normal heart sounds.  Good peripheral circulation. Respiratory: Normal respiratory effort.  No retractions. Lungs CTAB. Gastrointestinal: Soft and nontender. No distention. No abdominal bruits. No CVA tenderness. Musculoskeletal: No lower extremity tenderness nor edema.  Neurologic:  Normal speech and language. No gross focal neurologic deficits are appreciated.  Skin:  Skin is warm, dry and intact. No rash noted. .  ____________________________________________   LABS (all labs ordered are listed, but only abnormal results are displayed)  Labs Reviewed  SEDIMENTATION RATE  VITAMIN B12  C-REACTIVE PROTEIN   ____________________________________________  EKG   ____________________________________________  RADIOLOGY  ED MD interpretation:    Official radiology report(s): Mr Brain Wo Contrast  Result Date: 02/20/2019 CLINICAL DATA:  Worsening tremor.  Headache EXAM: MRI HEAD WITHOUT CONTRAST TECHNIQUE: Multiplanar, multiecho pulse  sequences of the brain and surrounding structures were obtained without intravenous contrast. COMPARISON:  None. FINDINGS: Brain: No acute infarction, hemorrhage, hydrocephalus, extra-axial collection or mass lesion. Normal white matter. Vascular: Normal arterial flow voids. Skull and upper cervical spine: Negative Sinuses/Orbits: Negative Other: None IMPRESSION: Negative MRI brain. Electronically Signed   By: Marlan Palauharles  Clark M.D.   On: 02/20/2019 14:58   Mr Cervical Spine Wo Contrast  Result Date: 02/20/2019 CLINICAL DATA:  53 year old female with headache, neck pain and lightheadedness for 2 days. EXAM: MRI CERVICAL SPINE WITHOUT CONTRAST TECHNIQUE: Multiplanar, multisequence MR imaging of the cervical spine was performed. No intravenous contrast was administered. COMPARISON:  Brain MRI today reported separately. FINDINGS: Alignment: Straightening of lower cervical lordosis. No spondylolisthesis. Vertebrae: No marrow edema or evidence of acute osseous abnormality. Visualized bone marrow signal is within normal limits. Cord: No signal abnormality in the cervical spinal cord despite some degenerative mass effect detailed below. Likewise, no signal abnormality in the visible upper thoracic cord. Posterior Fossa, vertebral arteries, paraspinal tissues: Cervicomedullary junction is within normal limits. Brain findings are reported separately today. Major vascular flow voids in the neck are preserved, the left vertebral artery appears dominant. Negative visible neck soft tissues and lung apices. Disc levels: C2-C3:  Negative. C3-C4: Mild endplate spurring and facet hypertrophy. Mild bilateral C4 foraminal stenosis. C4-C5: Mild disc bulge and endplate spurring mostly affecting the right neural foramen. Mild right C5 foraminal stenosis. C5-C6: Circumferential disc bulge with broad-based posterior component and endplate spurring. Mild to moderate facet and ligament flavum hypertrophy. Spinal stenosis with up to mild  spinal cord mass effect. Mild to moderate bilateral C6 foraminal stenosis. C6-C7: Circumferential disc bulge with broad-based posterior disc protrusion or extrusion with some cephalad migration of disc suspected (series 5, image 7). Mild to moderate facet and ligament flavum hypertrophy. Mild spinal stenosis and up to mild spinal cord mass effect. But no significant foraminal stenosis. C7-T1: Mild endplate and facet hypertrophy greater on the left. Mild to moderate left C8 foraminal stenosis. The visible upper thoracic spine is remarkable for a disc bulge with left paracentral disc herniation at T2-T3 seen on series 5, image 8. Up to mild thoracic spinal stenosis there, and multifactorial moderate to severe left T2 foraminal stenosis although related to posterior element hypertrophy to a greater degree than the disc. IMPRESSION: 1. 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 2. Generally mild degenerative cervical neural foraminal stenosis, up to moderate at the bilateral C6 and left C8 nerve levels. 3. Partially visible upper thoracic disc herniation at T2-T3, with up to mild spinal stenosis there and also multifactorial moderate to severe left T2 foraminal stenosis. Electronically Signed   By: Odessa FlemingH  Hall M.D.   On: 02/20/2019 15:08    ____________________________________________   PROCEDURES  Procedure(s) performed (including Critical Care):  Procedures   ____________________________________________   INITIAL IMPRESSION / ASSESSMENT AND PLAN / ED COURSE  Natasha Hanson was  evaluated in Emergency Department on 02/20/2019 for the symptoms described in the history of present illness. She was evaluated in the context of the global COVID-19 pandemic, which necessitated consideration that the patient might be at risk for infection with the SARS-CoV-2 virus that causes COVID-19. Institutional protocols and algorithms  that pertain to the evaluation of patients at risk for COVID-19 are in a state of rapid change based on information released by regulatory bodies including the CDC and federal and state organizations. These policies and algorithms were followed during the patient's care in the ED.     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  Dr. Marcell Barlow will follow up on the disc herniation.  Dr. Thad Ranger neurology saw the patient and is going to start her on some baclofen i since the brain MRI and cervical MRI are essentially nondiagnostic and her sed rate is negative.  Patient will follow-up with outpatient primary care and neurology and neurosurgery and will return for any worsening.         ____________________________________________   FINAL CLINICAL IMPRESSION(S) / ED DIAGNOSES  Final diagnoses:  Tremor     ED Discharge Orders         Ordered    Baclofen 5 MG TABS  2 times daily     02/20/19 1626           Note:  This document was prepared using Dragon voice recognition software and may include unintentional dictation errors.    Arnaldo Natal, MD 02/20/19 [phone]

## 2019-02-20 NOTE — ED Notes (Signed)
Lab called and stated that 2 SST tubes needed to be colelcted

## 2019-02-20 NOTE — ED Notes (Signed)
Dr. Reynolds neurologist at bedside. 

## 2019-02-20 NOTE — ED Notes (Signed)
Patient transported to MRI 

## 2019-02-20 NOTE — ED Notes (Signed)
Pt talking to MRI on the phone for screening questions.  

## 2019-02-20 NOTE — ED Notes (Signed)
Pt ambulatory to 19H. Steady gait noted.

## 2019-04-17 ENCOUNTER — Other Ambulatory Visit: Payer: Self-pay

## 2019-04-17 ENCOUNTER — Ambulatory Visit (LOCAL_COMMUNITY_HEALTH_CENTER): Payer: Self-pay

## 2019-04-17 DIAGNOSIS — Z23 Encounter for immunization: Secondary | ICD-10-CM

## 2019-06-12 ENCOUNTER — Other Ambulatory Visit: Payer: Self-pay | Admitting: Physical Medicine and Rehabilitation

## 2019-06-12 DIAGNOSIS — M5412 Radiculopathy, cervical region: Secondary | ICD-10-CM

## 2019-06-23 ENCOUNTER — Ambulatory Visit
Admission: RE | Admit: 2019-06-23 | Discharge: 2019-06-23 | Disposition: A | Discharge status: Home / Self Care | Payer: Self-pay | Source: Ambulatory Visit | Attending: Physical Medicine and Rehabilitation | Admitting: Physical Medicine and Rehabilitation

## 2019-06-23 ENCOUNTER — Other Ambulatory Visit: Payer: Self-pay

## 2019-06-23 DIAGNOSIS — M5412 Radiculopathy, cervical region: Secondary | ICD-10-CM

## 2019-06-23 MED ORDER — IOPAMIDOL (ISOVUE-M 300) INJECTION 61%
1.0000 mL | Freq: Once | INTRAMUSCULAR | Status: AC | PRN
  Administered 2019-06-23: 12:00:00 1 mL via EPIDURAL

## 2019-06-23 MED ORDER — TRIAMCINOLONE ACETONIDE 40 MG/ML IJ SUSP (RADIOLOGY)
60.0000 mg | Freq: Once | INTRAMUSCULAR | Status: AC
  Administered 2019-06-23: 12:00:00 60 mg via EPIDURAL

## 2019-09-04 NOTE — Progress Notes (Signed)
Patient: Natasha Hanson  Service Category: E/M  Provider: Gaspar Cola, MD  DOB: 1965-12-31  DOS: 09/08/2019  Location: Office  MRN: [id number]  Setting: Ambulatory outpatient  Referring Provider: Meeler, Sherren Kerns, FNP  Type: New Patient  Specialty: Interventional Pain Management  PCP: System, Pcp Not In  Location: Remote location  Delivery: TeleHealth     Virtual Encounter - Pain Management PROVIDER NOTE: Information contained herein reflects review and annotations entered in association with encounter. Interpretation of such information and data should be left to medically-trained personnel. Information provided to patient can be located elsewhere in the medical record under "Patient Instructions". Document created using STT-dictation technology, any transcriptional errors that may result from process are unintentional.    Contact & Pharmacy Preferred: [phone] Home: [phone] (home) Mobile: [phone] (mobile) E-mail: [email or web address]  [facility] [place] [facility] [place] [address] Phone: [phone] Fax: [phone]   Pre-screening note:  Our staff contacted Natasha Hanson and offered her an "in person", "face-to-face" appointment versus a telephone encounter. She indicated preferring the telephone encounter, at this time.  Primary Reason(s) for Visit: Tele-Encounter for initial evaluation of one or more chronic problems (new to examiner) potentially causing chronic pain, and posing a threat to normal musculoskeletal function. (Level of risk: High) CC: Back Pain, Neck Pain, and Knee Pain  I contacted Natasha Hanson on 09/08/2019 via telephone.      I clearly identified myself as Gaspar Cola, MD. I verified that I was speaking with the correct person using two identifiers (Name: Natasha Hanson, and date of birth: March 24, 1966).  Advanced Informed Consent I sought verbal advanced  consent from Natasha Hanson for virtual visit interactions. I informed Natasha Hanson of possible security and privacy concerns, risks, and limitations associated with providing "not-in-person" medical evaluation and management services. I also informed Natasha Hanson of the availability of "in-person" appointments. Finally, I informed her that there would be a charge for the virtual visit and that she could be  personally, fully or partially, financially responsible for it. Natasha Hanson expressed understanding and agreed to proceed.   HPI  Natasha Hanson is a 54 y.o. year old, female patient, contacted today for an initial evaluation of her chronic pain. She has Tremor; Chronic pain syndrome; Pharmacologic therapy; Disorder of skeletal system; Problems influencing health status; Cervicalgia; DDD (degenerative disc disease), cervical; Cervical facet hypertrophy; Cervical facet syndrome; Cervical central spinal stenosis; Abnormal MRI, cervical spine (02/20/2019); Cervical foraminal stenosis; Chronic low back pain (1ry area of Pain) (Bilateral) (Midline) (L>R) w/o sciatica; Chronic knee pain (2ry area of Pain) (Bilateral) (L>R); Chronic neck pain (3ry area of Pain) (Bilateral) (L>R); DDD (degenerative disc disease), lumbosacral; and Long term prescription opiate use on their problem list.   Onset and Duration: Gradual Cause of pain: Work related accident or event Severity: Getting worse, NAS-11 at its worse: 10/10, NAS-11 at its best: 8/10, NAS-11 now: 10/10 and NAS-11 on the average: 8/10 Timing: Not influenced by the time of the day Aggravating Factors: Motion and Working Alleviating Factors: Medications Associated Problems: Pain that wakes patient up and Pain that does not allow patient to sleep Quality of Pain: Aching, Constant, Sharp, Shooting and Stabbing Previous Examinations or Tests: MRI scan Previous Treatments: Narcotic medications, Physical Therapy and Trigger point injections  According to the patient her  worst pain is that of the lower back.  This pain apparently started approximately 18 years ago rather  suddenly, but the patient cannot tell us exactly what triggered it.  She denies any type of trauma associated with it.  According to the patient the primary area of pain is that of the lower back, bilaterally, with the worst pain in the midline.  She also has that the left side is worse than the right.  She denies any back surgeries, nerve blocks, recent x-rays, or physical therapy.  The patient's second worst pain is that of the knees, bilaterally, with the left being worse than the right.  She denies having seen her orthopedic surgeon for this pain.  In the case of the left knee the patient denies any surgeries, joint injections, physical therapy, or recent x-rays.  In the case of the right knee, the patient again denies any surgeries, joint injections, physical therapy, or recent x-rays.  The third worst pain is that of the neck, posteriorly, bilateral, with the left being worse than the right.  The patient does admit to some headaches associated with this neck pain which usually occur over the area of the forehead.  When she has the headaches, they are always bilateral.  The patient denies any prodrome but does admit to occasionally (rarely) seeing spots when she has the headache.  She denies any neck surgeries, but she does admit to having had 1 steroid injection done this year, 2021, by [facility].  According to the patient she was sent over there by [name].  She also admits having had physical therapy for approximately 1 to 2 months, which was stopped approximately 1 months ago.  She describes that neither they injection nor the physical therapy helped her pain.  She indicates having had some x-rays of the cervical spine done last year.  In addition, the patient indicates having occasional shoulder pains, bilaterally, with the left being worse than the right.  She also describes some  numbness into fingers, but with no pain or weakness associated with it.  She also indicates that this tends to be intermittent.  The patient specifically denies any lower extremity pain other than in the area of the knees.  She indicates being scheduled for a spine MRI around April 12 at [facility].  In terms of the patient's medications, she describes taking gabapentin 600 mg p.o. nightly for the pain as well has hydrocodone 5/325 4 to 6 tablets/day for the past 18 years.  She indicates that this was started with her low back pain.  In addition to the above the patient's record indicates that she was also on meloxicam 15 mg p.o. daily, Robaxin 500 mg p.o. 4 times daily, and baclofen 5 mg p.o. twice daily.  Apparently she has stopped the meloxicam, and the baclofen.  Today I took time to explain to the patient that our specialty is that of interventional pain management and that we do not take patients for medication management only.  This was done early in the interview given the patient the opportunity to cancel the rest of the visit should this not be what she was looking for.  However, she indicated that she wanted me to proceed with the full evaluation to see what is it that I could offer her in terms of interventional therapies.  Historic Controlled Substance Pharmacotherapy Review  Current opioid analgesics:  Hydrocodone/APAP 5/325 1 tab PO BID (10 mg/day of hydrocodone) Highest recorded MME/day: 30 mg/day MME/day: 10 mg/day   Historical Monitoring: The patient  has no history on file for drug. List of all UDS  Test(s): No results found. List of other Serum/Urine Drug Screening Test(s):  No results found. Historical Background Evaluation: [place] PMP: PDMP reviewed during this encounter. Two (2) year initial data search conducted.             PMP NARX Score Report:  Narcotic: 391 Sedative: 180 Stimulant: 000 Risk Assessment Profile: PMP NARX Overdose Risk Score: 400  Pharmacologic Plan: As per  protocol, I have not taken over any controlled substance management, pending the results of ordered tests and/or consults.            Initial impression: Pending review of available data and ordered tests.  Meds   Current Outpatient Medications:  .  albuterol (VENTOLIN HFA) 108 (90 Base) MCG/ACT inhaler, 2-4 puffs Q4 PRN SOB or tight cough, Disp: , Rfl:  .  gabapentin (NEURONTIN) 300 MG capsule, 600 mg 4 (four) times daily. , Disp: , Rfl:  .  methocarbamol (ROBAXIN) 500 MG tablet, Take 500 mg by mouth 4 (four) times daily. , Disp: , Rfl:  .  propranolol (INDERAL) 10 MG tablet, Take 20 mg by mouth 4 (four) times daily. , Disp: , Rfl:   ROS  Cardiovascular: No reported cardiovascular signs or symptoms such as High blood pressure, coronary artery disease, abnormal heart rate or rhythm, heart attack, blood thinner therapy or heart weakness and/or failure Pulmonary or Respiratory: Lung problems and Smoking Neurological: No reported neurological signs or symptoms such as seizures, abnormal skin sensations, urinary and/or fecal incontinence, being born with an abnormal open spine and/or a tethered spinal cord Psychological-Psychiatric: No reported psychological or psychiatric signs or symptoms such as difficulty sleeping, anxiety, depression, delusions or hallucinations (schizophrenial), mood swings (bipolar disorders) or suicidal ideations or attempts Gastrointestinal: No reported gastrointestinal signs or symptoms such as vomiting or evacuating blood, reflux, heartburn, alternating episodes of diarrhea and constipation, inflamed or scarred liver, or pancreas or irrregular and/or infrequent bowel movements Genitourinary: No reported renal or genitourinary signs or symptoms such as difficulty voiding or producing urine, peeing blood, non-functioning kidney, kidney stones, difficulty emptying the bladder, difficulty controlling the flow of urine, or chronic kidney disease Hematological: No reported  hematological signs or symptoms such as prolonged bleeding, low or poor functioning platelets, bruising or bleeding easily, hereditary bleeding problems, low energy levels due to low hemoglobin or being anemic Endocrine: No reported endocrine signs or symptoms such as high or low blood sugar, rapid heart rate due to high thyroid levels, obesity or weight gain due to slow thyroid or thyroid disease Rheumatologic: No reported rheumatological signs and symptoms such as fatigue, joint pain, tenderness, swelling, redness, heat, stiffness, decreased range of motion, with or without associated rash Musculoskeletal: Negative for myasthenia gravis, muscular dystrophy, multiple sclerosis or malignant hyperthermia Work History: Working full time  Allergies  Natasha Hanson has No Known Allergies.  Laboratory Chemistry Profile   Renal Lab Results  Component Value Date   BUN 14 02/15/2019   CREATININE 0.71 02/15/2019   GFRAA >60 02/15/2019   GFRNONAA >60 02/15/2019   PROTEINUR 30 (A) 01/05/2018    Electrolytes Lab Results  Component Value Date   NA 138 02/15/2019   K 4.2 02/15/2019   CL 102 02/15/2019   CALCIUM 9.5 02/15/2019    Hepatic Lab Results  Component Value Date   AST 19 02/15/2019   ALT 14 02/15/2019   ALBUMIN 4.6 02/15/2019   ALKPHOS 70 02/15/2019   LIPASE 21 01/05/2018    ID No results found.  Bone No results found.  Endocrine Lab Results  Component Value Date   GLUCOSE 77 02/15/2019   GLUCOSEU NEGATIVE 01/05/2018   TSH 0.967 02/15/2019    Neuropathy Lab Results  Component Value Date   VITAMINB12 241 02/20/2019    CNS No results found.  Inflammation (CRP: Acute  ESR: Chronic) Lab Results  Component Value Date   CRP <0.8 02/20/2019   ESRSEDRATE 15 02/20/2019    Rheumatology No results found.  Coagulation Lab Results  Component Value Date   PLT 372 02/15/2019    Cardiovascular Lab Results  Component Value Date   TROPONINI <0.03 01/05/2018   HGB 15.5 (H)  02/15/2019   HCT 45.7 02/15/2019    Screening No results found.   Cancer No results found.  Allergens No results found.     Note: Lab results reviewed.  Imaging Review  Cervical Imaging: Cervical MR wo contrast:  Results for orders placed during the hospital encounter of 02/20/19  MR Cervical Spine Wo Contrast   Narrative CLINICAL DATA:  55 year old female with headache, neck pain and lightheadedness for 2 days.  EXAM: MRI CERVICAL SPINE WITHOUT CONTRAST  TECHNIQUE: Multiplanar, multisequence MR imaging of the cervical spine was performed. No intravenous contrast was administered.  COMPARISON:  Brain MRI today reported separately.  FINDINGS: Alignment: Straightening of lower cervical lordosis. No spondylolisthesis.  Vertebrae: No marrow edema or evidence of acute osseous abnormality. Visualized bone marrow signal is within normal limits.  Cord: No signal abnormality in the cervical spinal cord despite some degenerative mass effect detailed below. Likewise, no signal abnormality in the visible upper thoracic cord.  Posterior Fossa, vertebral arteries, paraspinal tissues: Cervicomedullary junction is within normal limits. Brain findings are reported separately today. Major vascular flow voids in the neck are preserved, the left vertebral artery appears dominant. Negative visible neck soft tissues and lung apices.  Disc levels:  C2-C3:  Negative.  C3-C4: Mild endplate spurring and facet hypertrophy. Mild bilateral C4 foraminal stenosis.  C4-C5: Mild disc bulge and endplate spurring mostly affecting the right neural foramen. Mild right C5 foraminal stenosis.  C5-C6: Circumferential disc bulge with broad-based posterior component and endplate spurring. Mild to moderate facet and ligament flavum hypertrophy. Spinal stenosis with up to mild spinal cord mass effect. Mild to moderate bilateral C6 foraminal stenosis.  C6-C7: Circumferential disc bulge with  broad-based posterior disc protrusion or extrusion with some cephalad migration of disc suspected (series 5, image 7). Mild to moderate facet and ligament flavum hypertrophy. Mild spinal stenosis and up to mild spinal cord mass effect. But no significant foraminal stenosis.  C7-T1: Mild endplate and facet hypertrophy greater on the left. Mild to moderate left C8 foraminal stenosis.  The visible upper thoracic spine is remarkable for a disc bulge with left paracentral disc herniation at T2-T3 seen on series 5, image 8. Up to mild thoracic spinal stenosis there, and multifactorial moderate to severe left T2 foraminal stenosis although related to posterior element hypertrophy to a greater degree than the disc.  IMPRESSION: 1. 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 2. Generally mild degenerative cervical neural foraminal stenosis, up to moderate at the bilateral C6 and left C8 nerve levels. 3. Partially visible upper thoracic disc herniation at T2-T3, with up to mild spinal stenosis there and also multifactorial moderate to severe left T2 foraminal stenosis.   Electronically Signed   By: Genevie Ann M.D.   On: 02/20/2019 15:08  Complexity Note: Imaging results reviewed. Results shared with Natasha Hanson, using Layman's terms.                        PFSH  Drug: Natasha Hanson  has no history on file for drug. Alcohol:  reports no history of alcohol use. Tobacco:  reports that she has been smoking. She has never used smokeless tobacco. Medical:  has no past medical history on file. Family: family history is not on file.  Past Surgical History:  Procedure Laterality Date  . ABDOMINAL HYSTERECTOMY    . FOOT SURGERY    . REFRACTIVE SURGERY     Active Ambulatory Problems    Diagnosis Date Noted  . Tremor 11/12/2018  . Chronic pain syndrome 09/08/2019  . Pharmacologic therapy 09/08/2019  .  Disorder of skeletal system 09/08/2019  . Problems influencing health status 09/08/2019  . Cervicalgia 09/08/2019  . DDD (degenerative disc disease), cervical 09/08/2019  . Cervical facet hypertrophy 09/08/2019  . Cervical facet syndrome 09/08/2019  . Cervical central spinal stenosis 09/08/2019  . Abnormal MRI, cervical spine (02/20/2019) 09/08/2019  . Cervical foraminal stenosis 09/08/2019  . Chronic low back pain (1ry area of Pain) (Bilateral) (Midline) (L>R) w/o sciatica 09/08/2019  . Chronic knee pain (2ry area of Pain) (Bilateral) (L>R) 09/08/2019  . Chronic neck pain (3ry area of Pain) (Bilateral) (L>R) 09/08/2019  . DDD (degenerative disc disease), lumbosacral 09/08/2019  . Long term prescription opiate use 09/08/2019   Resolved Ambulatory Problems    Diagnosis Date Noted  . No Resolved Ambulatory Problems   No Additional Past Medical History   Assessment  Primary Diagnosis & Pertinent Problem List: The primary encounter diagnosis was Chronic low back pain (1ry area of Pain) (Bilateral) (Midline) (L>R) w/o sciatica. Diagnoses of DDD (degenerative disc disease), lumbosacral, Chronic knee pain (2ry area of Pain) (Bilateral) (L>R), Chronic neck pain (3ry area of Pain) (Bilateral) (L>R), Cervicalgia, DDD (degenerative disc disease), cervical, Cervical facet syndrome, Cervical facet hypertrophy, Cervical central spinal stenosis, Cervical foraminal stenosis, Abnormal MRI, cervical spine (02/20/2019), Chronic pain syndrome, Pharmacologic therapy, Long term prescription opiate use, Problems influencing health status, and Disorder of skeletal system were also pertinent to this visit.  Visit Diagnosis (New problems to examiner): 1. Chronic low back pain (1ry area of Pain) (Bilateral) (Midline) (L>R) w/o sciatica   2. DDD (degenerative disc disease), lumbosacral   3. Chronic knee pain (2ry area of Pain) (Bilateral) (L>R)   4. Chronic neck pain (3ry area of Pain) (Bilateral) (L>R)   5.  Cervicalgia   6. DDD (degenerative disc disease), cervical   7. Cervical facet syndrome   8. Cervical facet hypertrophy   9. Cervical central spinal stenosis   10. Cervical foraminal stenosis   11. Abnormal MRI, cervical spine (02/20/2019)   12. Chronic pain syndrome   13. Pharmacologic therapy   14. Long term prescription opiate use   15. Problems influencing health status   16. Disorder of skeletal system    Plan of Care (Initial workup plan)  Note: Natasha Hanson was reminded that as per protocol, today's visit has been an evaluation only. We have not taken over the patient's controlled substance management.  Problem-specific plan: No problem-specific Assessment & Plan notes found for this encounter.   Lab Orders     Compliance Drug Analysis, Ur     Comp. Metabolic Panel (12)     Magnesium     Vitamin B12     Sedimentation rate  25-Hydroxy vitamin D Lcms D2+D3     C-reactive protein  Imaging Orders     DG Lumbar Spine Complete W/Bend     DG Knee 1-2 Views Right     DG Knee 1-2 Views Left     DG Cervical Spine With Flex & Extend Referral Orders  No referral(s) requested today   Procedure Orders    No procedure(s) ordered today   Pharmacotherapy (current): Medications ordered:  No orders of the defined types were placed in this encounter.    Pharmacological management options:  Opioid Analgesics: The patient was informed that there is no guarantee that she would be a candidate for opioid analgesics. The decision will be made following CDC guidelines. This decision will be based on the results of diagnostic studies, as well as Natasha Hanson's risk profile.   Membrane stabilizer: To be determined at a later time  Muscle relaxant: To be determined at a later time  NSAID: To be determined at a later time  Other analgesic(s): To be determined at a later time   Interventional management options: Natasha Hanson was informed that there is no guarantee that she would be a candidate  for interventional therapies. The decision will be based on the results of diagnostic studies, as well as Natasha Hanson's risk profile.  Procedure(s) under consideration:  Diagnostic left lumbar ESI  Diagnostic bilateral lumbar facet block  Possible bilateral lumbar facet RFA  Diagnostic bilateral intra-articular knee joint injection  Possible bilateral Hyalgan knee injections  Diagnostic bilateral genicular nerve block  Possible bilateral genicular nerve RFA  Diagnostic left cervical ESI  Diagnostic bilateral cervical facet block  Possible bilateral cervical facet RFA    Provider-requested follow-up: Return for (VV), (2V), (s/p Tests).  Future Appointments  Date Time Provider [place]  09/08/2019  2:30 PM Milinda Pointer, MD [facility] None   Total duration of encounter: 35 minutes.  Primary Care Physician: System, Pcp Not In Note by: Gaspar Cola, MD Date: 09/08/2019; Time: 2:15 PM

## 2019-09-05 ENCOUNTER — Telehealth: Payer: Self-pay

## 2019-09-05 NOTE — Telephone Encounter (Signed)
LM for patient to call office for new patient questionaire.

## 2019-09-08 ENCOUNTER — Ambulatory Visit: Payer: BC Managed Care – PPO | Attending: Pain Medicine | Admitting: Pain Medicine

## 2019-09-08 ENCOUNTER — Other Ambulatory Visit: Payer: Self-pay

## 2019-09-08 ENCOUNTER — Telehealth: Payer: Self-pay | Admitting: *Deleted

## 2019-09-08 VITALS — Ht 63.0 in | Wt 130.0 lb

## 2019-09-08 DIAGNOSIS — G8929 Other chronic pain: Secondary | ICD-10-CM

## 2019-09-08 DIAGNOSIS — M5137 Other intervertebral disc degeneration, lumbosacral region: Secondary | ICD-10-CM

## 2019-09-08 DIAGNOSIS — M25561 Pain in right knee: Secondary | ICD-10-CM | POA: Diagnosis not present

## 2019-09-08 DIAGNOSIS — M545 Low back pain: Secondary | ICD-10-CM

## 2019-09-08 DIAGNOSIS — M4802 Spinal stenosis, cervical region: Secondary | ICD-10-CM

## 2019-09-08 DIAGNOSIS — M542 Cervicalgia: Secondary | ICD-10-CM | POA: Diagnosis not present

## 2019-09-08 DIAGNOSIS — M899 Disorder of bone, unspecified: Secondary | ICD-10-CM

## 2019-09-08 DIAGNOSIS — R937 Abnormal findings on diagnostic imaging of other parts of musculoskeletal system: Secondary | ICD-10-CM

## 2019-09-08 DIAGNOSIS — Z789 Other specified health status: Secondary | ICD-10-CM | POA: Insufficient documentation

## 2019-09-08 DIAGNOSIS — M47816 Spondylosis without myelopathy or radiculopathy, lumbar region: Secondary | ICD-10-CM | POA: Insufficient documentation

## 2019-09-08 DIAGNOSIS — M25562 Pain in left knee: Secondary | ICD-10-CM

## 2019-09-08 DIAGNOSIS — M47812 Spondylosis without myelopathy or radiculopathy, cervical region: Secondary | ICD-10-CM

## 2019-09-08 DIAGNOSIS — Z79899 Other long term (current) drug therapy: Secondary | ICD-10-CM | POA: Insufficient documentation

## 2019-09-08 DIAGNOSIS — M5030 Other cervical disc degeneration, unspecified cervical region: Secondary | ICD-10-CM

## 2019-09-08 DIAGNOSIS — G894 Chronic pain syndrome: Secondary | ICD-10-CM

## 2019-09-08 DIAGNOSIS — M170 Bilateral primary osteoarthritis of knee: Secondary | ICD-10-CM | POA: Insufficient documentation

## 2019-09-08 DIAGNOSIS — Z79891 Long term (current) use of opiate analgesic: Secondary | ICD-10-CM | POA: Insufficient documentation

## 2019-09-08 HISTORY — DX: Other specified health status: Z78.9

## 2019-09-08 NOTE — Patient Instructions (Signed)
____________________________________________________________________________________________  Drug Holidays (Slow)  What is a "Drug Holiday"? Drug Holiday: is the name given to the period of time during which a patient stops taking a medication(s) for the purpose of eliminating tolerance to the drug.  Benefits . Improved effectiveness of opioids. . Decreased opioid dose needed to achieve benefits. . Improved pain with lesser dose.  What is tolerance? Tolerance: is the progressive decreased in effectiveness of a drug due to its repetitive use. With repetitive use, the body gets use to the medication and as a consequence, it loses its effectiveness. This is a common problem seen with opioid pain medications. As a result, a larger dose of the drug is needed to achieve the same effect that used to be obtained with a smaller dose.  How long should a "Drug Holiday" last? You should stay off of the pain medicine for at least 14 consecutive days. (2 weeks)  Should I stop the medicine "cold turkey"? No. You should always coordinate with your Pain Specialist so that he/she can provide you with the correct medication dose to make the transition as smoothly as possible.  How do I stop the medicine? Slowly. You will be instructed to decrease the daily amount of pills that you take by one (1) pill every seven (7) days. This is called a "slow downward taper" of your dose. For example: if you normally take four (4) pills per day, you will be asked to drop this dose to three (3) pills per day for seven (7) days, then to two (2) pills per day for seven (7) days, then to one (1) per day for seven (7) days, and at the end of those last seven (7) days, this is when the "Drug Holiday" would start.   Will I have withdrawals? By doing a "slow downward taper" like this one, it is unlikely that you will experience any significant withdrawal symptoms. Typically, what triggers withdrawals is the sudden stop of a high  dose opioid therapy. Withdrawals can usually be avoided by slowly decreasing the dose over a prolonged period of time.  What are withdrawals? Withdrawals: refers to the wide range of symptoms that occur after stopping or dramatically reducing opiate drugs after heavy and prolonged use. Withdrawal symptoms do not occur to patients that use low dose opioids, or those who take the medication sporadically. Contrary to benzodiazepine (example: Valium, Xanax, etc.) or alcohol withdrawals ("Delirium Tremens"), opioid withdrawals are not lethal. Withdrawals are the physical manifestation of the body getting rid of the excess receptors.  Expected Symptoms Early symptoms of withdrawal may include: . Agitation . Anxiety . Muscle aches . Increased tearing . Insomnia . Runny nose . Sweating . Yawning  Late symptoms of withdrawal may include: . Abdominal cramping . Diarrhea . Dilated pupils . Goose bumps . Nausea . Vomiting  Will I experience withdrawals? Due to the slow nature of the taper, it is very unlikely that you will experience any.  What is a slow taper? Taper: refers to the gradual decrease in dose.  ___________________________________________________________________________________________     

## 2019-09-09 ENCOUNTER — Ambulatory Visit
Admission: RE | Admit: 2019-09-09 | Discharge: 2019-09-09 | Disposition: A | Discharge status: Home / Self Care | Payer: BC Managed Care – PPO | Source: Ambulatory Visit | Attending: Pain Medicine | Admitting: Pain Medicine

## 2019-09-09 DIAGNOSIS — M545 Low back pain, unspecified: Secondary | ICD-10-CM

## 2019-09-09 DIAGNOSIS — M25562 Pain in left knee: Secondary | ICD-10-CM | POA: Insufficient documentation

## 2019-09-09 DIAGNOSIS — M542 Cervicalgia: Secondary | ICD-10-CM | POA: Diagnosis present

## 2019-09-09 DIAGNOSIS — M25561 Pain in right knee: Secondary | ICD-10-CM | POA: Insufficient documentation

## 2019-09-09 DIAGNOSIS — M47812 Spondylosis without myelopathy or radiculopathy, cervical region: Secondary | ICD-10-CM | POA: Insufficient documentation

## 2019-09-09 DIAGNOSIS — G8929 Other chronic pain: Secondary | ICD-10-CM | POA: Insufficient documentation

## 2019-09-09 DIAGNOSIS — M5137 Other intervertebral disc degeneration, lumbosacral region: Secondary | ICD-10-CM

## 2019-09-09 DIAGNOSIS — M4802 Spinal stenosis, cervical region: Secondary | ICD-10-CM | POA: Insufficient documentation

## 2019-09-09 DIAGNOSIS — M5030 Other cervical disc degeneration, unspecified cervical region: Secondary | ICD-10-CM

## 2019-09-12 LAB — COMPLIANCE DRUG ANALYSIS, UR

## 2019-09-12 NOTE — Progress Notes (Signed)
Dr Laban Emperor, did patient intake information. She has [facility] listed as pharmacy however, if you prescribe pain medication that will need to go to [facility]. I will add that into the queu but l will leave [facility] as primary.

## 2019-09-14 NOTE — Progress Notes (Signed)
Unsuccessful attempt to contact patient for Virtual Visit (Pain Management Telehealth)   Patient provided contact information:  [phone] (home); [phone] (mobile); (Preferred) [phone] [email or web address]   Pre-screening:  Our staff was successful in contacting Ms. Helzer using the above provided information.   I unsuccessfully attempted to make contact with Andreas Newport on 09/15/2019 via telephone. I was unable to complete the virtual encounter due to call going directly to voicemail. I was able to leave a message where I clearly identify myself as Oswaldo Done, MD and I left a message to call us back to reschedule the call.  Pharmacotherapy Assessment  Analgesic: Hydrocodone/APAP 5/325 1 tab PO BID (10 mg/day of hydrocodone) Highest recorded MME/day: 30 mg/day MME/day: 10 mg/day   Follow-up plan:   Reschedule Visit.      Interventional treatment options: Planned, scheduled, and/or pending:      Under consideration:   Diagnostic left lumbar ESI  Diagnostic bilateral lumbar facet block  Possible bilateral lumbar facet RFA  Diagnostic bilateral intra-articular knee joint injection  Possible bilateral Hyalgan knee injections  Diagnostic bilateral genicular nerve block  Possible bilateral genicular nerve RFA  Diagnostic left cervical ESI  Diagnostic bilateral cervical facet block  Possible bilateral cervical facet RFA    Therapeutic/palliative (PRN):   None at this time    Recent Visits Date Type Provider Dept  09/08/19 Office Visit Delano Metz, MD [facility]  Showing recent visits within past 90 days and meeting all other requirements   Today's Visits Date Type Provider Dept  09/15/19 Telemedicine Delano Metz, MD [facility]  Showing today's visits and meeting all other requirements   Future Appointments No visits were found meeting these conditions.  Showing future appointments within next 90 days and meeting  all other requirements    Note by: Oswaldo Done, MD Date: 09/15/2019; Time: 2:26 PM

## 2019-09-15 ENCOUNTER — Other Ambulatory Visit: Payer: Self-pay

## 2019-09-15 ENCOUNTER — Ambulatory Visit: Payer: BC Managed Care – PPO | Attending: Pain Medicine | Admitting: Pain Medicine

## 2019-09-15 DIAGNOSIS — M47816 Spondylosis without myelopathy or radiculopathy, lumbar region: Secondary | ICD-10-CM | POA: Insufficient documentation

## 2019-09-15 DIAGNOSIS — G894 Chronic pain syndrome: Secondary | ICD-10-CM

## 2019-09-15 DIAGNOSIS — Z5329 Procedure and treatment not carried out because of patient's decision for other reasons: Secondary | ICD-10-CM

## 2019-09-15 DIAGNOSIS — M1712 Unilateral primary osteoarthritis, left knee: Secondary | ICD-10-CM | POA: Insufficient documentation

## 2019-09-15 DIAGNOSIS — M170 Bilateral primary osteoarthritis of knee: Secondary | ICD-10-CM | POA: Insufficient documentation

## 2019-09-15 DIAGNOSIS — M1711 Unilateral primary osteoarthritis, right knee: Secondary | ICD-10-CM | POA: Insufficient documentation

## 2019-09-16 ENCOUNTER — Telehealth: Payer: Self-pay | Admitting: *Deleted

## 2019-09-16 ENCOUNTER — Encounter: Payer: Self-pay | Admitting: Pain Medicine

## 2019-09-16 LAB — COMP. METABOLIC PANEL (12)
AST: 16 IU/L (ref 0–40)
Albumin/Globulin Ratio: 1.7 (ref 1.2–2.2)
Albumin: 4.3 g/dL (ref 3.8–4.9)
Alkaline Phosphatase: 70 IU/L (ref 39–117)
BUN/Creatinine Ratio: 12 (ref 9–23)
BUN: 9 mg/dL (ref 6–24)
Bilirubin Total: 0.2 mg/dL (ref 0.0–1.2)
Calcium: 9.5 mg/dL (ref 8.7–10.2)
Chloride: 101 mmol/L (ref 96–106)
Creatinine, Ser: 0.73 mg/dL (ref 0.57–1.00)
GFR calc Af Amer: 109 mL/min/{1.73_m2} (ref >59)
GFR calc non Af Amer: 94 mL/min/{1.73_m2} (ref >59)
Globulin, Total: 2.6 g/dL (ref 1.5–4.5)
Glucose: 125 mg/dL — ABNORMAL HIGH (ref 65–99)
Potassium: 4.7 mmol/L (ref 3.5–5.2)
Sodium: 139 mmol/L (ref 134–144)
Total Protein: 6.9 g/dL (ref 6.0–8.5)

## 2019-09-16 LAB — 25-HYDROXY VITAMIN D LCMS D2+D3
25-Hydroxy, Vitamin D-2: <1 ng/mL
25-Hydroxy, Vitamin D-3: 33 ng/mL
25-Hydroxy, Vitamin D: 33 ng/mL

## 2019-09-16 LAB — SEDIMENTATION RATE: Sed Rate: 7 mm/hr (ref 0–40)

## 2019-09-16 LAB — VITAMIN B12: Vitamin B-12: 299 pg/mL (ref 232–1245)

## 2019-09-16 LAB — MAGNESIUM: Magnesium: 1.8 mg/dL (ref 1.6–2.3)

## 2019-09-16 LAB — C-REACTIVE PROTEIN: CRP: 2 mg/L (ref 0–10)

## 2019-09-17 ENCOUNTER — Ambulatory Visit: Payer: BC Managed Care – PPO | Attending: Pain Medicine | Admitting: Pain Medicine

## 2019-09-17 ENCOUNTER — Telehealth: Payer: Self-pay | Admitting: *Deleted

## 2019-09-17 ENCOUNTER — Other Ambulatory Visit: Payer: Self-pay

## 2019-09-17 DIAGNOSIS — M47898 Other spondylosis, sacral and sacrococcygeal region: Secondary | ICD-10-CM

## 2019-09-17 DIAGNOSIS — M25562 Pain in left knee: Secondary | ICD-10-CM

## 2019-09-17 DIAGNOSIS — M25552 Pain in left hip: Secondary | ICD-10-CM

## 2019-09-17 DIAGNOSIS — M47816 Spondylosis without myelopathy or radiculopathy, lumbar region: Secondary | ICD-10-CM | POA: Diagnosis not present

## 2019-09-17 DIAGNOSIS — M545 Low back pain, unspecified: Secondary | ICD-10-CM

## 2019-09-17 DIAGNOSIS — M5137 Other intervertebral disc degeneration, lumbosacral region: Secondary | ICD-10-CM

## 2019-09-17 DIAGNOSIS — M25561 Pain in right knee: Secondary | ICD-10-CM

## 2019-09-17 DIAGNOSIS — M9904 Segmental and somatic dysfunction of sacral region: Secondary | ICD-10-CM

## 2019-09-17 DIAGNOSIS — G894 Chronic pain syndrome: Secondary | ICD-10-CM

## 2019-09-17 DIAGNOSIS — M47817 Spondylosis without myelopathy or radiculopathy, lumbosacral region: Secondary | ICD-10-CM | POA: Insufficient documentation

## 2019-09-17 DIAGNOSIS — M25551 Pain in right hip: Secondary | ICD-10-CM

## 2019-09-17 DIAGNOSIS — G8929 Other chronic pain: Secondary | ICD-10-CM | POA: Insufficient documentation

## 2019-09-17 DIAGNOSIS — M533 Sacrococcygeal disorders, not elsewhere classified: Secondary | ICD-10-CM

## 2019-09-17 DIAGNOSIS — M542 Cervicalgia: Secondary | ICD-10-CM

## 2019-09-17 NOTE — Progress Notes (Signed)
Patient: Natasha Hanson  Service Category: E/M  Provider: Gaspar Cola, MD  DOB: 11-14-1965  DOS: 09/17/2019  Location: Office  MRN: [id number]  Setting: Ambulatory outpatient  Referring Provider: No ref. provider found  Type: Established Patient  Specialty: Interventional Pain Management  PCP: System, Pcp Not In  Location: Remote location  Delivery: TeleHealth     Virtual Encounter - Pain Management PROVIDER NOTE: Information contained herein reflects review and annotations entered in association with encounter. Interpretation of such information and data should be left to medically-trained personnel. Information provided to patient can be located elsewhere in the medical record under "Patient Instructions". Document created using STT-dictation technology, any transcriptional errors that may result from process are unintentional.    Contact & Pharmacy Preferred: [phone] Home: [phone] (home) Mobile: [phone] (mobile) E-mail: [email or web address]  [facility] [address]), [place]) [place] [address] Phone: [phone] Fax: [place], [place] [address] Phone: [phone] Fax: [phone]   Pre-screening note:  Our staff contacted [name] and offered her an "in person", "face-to-face" appointment versus a telephone encounter. She indicated preferring the telephone encounter, at this time.   Primary Reason(s) for Virtual Visit: Encounter for evaluation before starting new chronic pain management plan of care (Level of risk: moderate) COVID-19*  Social distancing based on CDC ans AMA recommendations.    I contacted [name] on 09/17/2019 via telephone.      I clearly identified myself as Gaspar Cola, MD. I verified that I was speaking with the correct person using two  identifiers (Name: Natasha Hanson, and date of birth: 09/29/1965).  Advanced Informed Consent I sought verbal advanced consent from [name] for virtual visit interactions. I informed [name] of possible security and privacy concerns, risks, and limitations associated with providing "not-in-person" medical evaluation and management services. I also informed [name] of the availability of "in-person" appointments. Finally, I informed her that there would be a charge for the virtual visit and that she could be  personally, fully or partially, financially responsible for it. [name] expressed understanding and agreed to proceed.   Historic Elements   Ms. DASIAH Hanson is a 54 y.o. year old, female patient evaluated today after her last encounter by our practice on 09/16/2019. [name]  has no past medical history on file. She also  has a past surgical history that includes Refractive surgery; Abdominal hysterectomy; and Foot surgery. [name] has a current medication list which includes the following prescription(s): albuterol, gabapentin, methocarbamol, and propranolol. She  reports that she has been smoking. She has never used smokeless tobacco. She reports that she does not drink alcohol. No history on file for drug. [name] has No Known Allergies.   HPI  She is being evaluated for review of studies ordered on initial visit and to consider treatment plan options. Today I went over the results of her tests. These were explained in "Layman's terms". During today's appointment I went over my diagnostic impression, as well as the proposed treatment plan.  According to the patient her worst pain is that of the lower back.  This pain apparently started approximately 18 years ago rather suddenly, but the patient cannot tell us exactly what triggered it.  She denies any type of trauma associated with it.  According to the patient the primary  area of pain is that of the lower back, bilaterally, with  the worst pain in the midline.  She also has that the left side is worse than the right.  She denies any back surgeries, nerve blocks, recent x-rays, or physical therapy.  The patient's second worst pain is that of the knees, bilaterally, with the left being worse than the right.  She denies having seen her orthopedic surgeon for this pain.  In the case of the left knee the patient denies any surgeries, joint injections, physical therapy, or recent x-rays.  In the case of the right knee, the patient again denies any surgeries, joint injections, physical therapy, or recent x-rays.  The third worst pain is that of the neck, posteriorly, bilateral, with the left being worse than the right.  The patient does admit to some headaches associated with this neck pain which usually occur over the area of the forehead.  When she has the headaches, they are always bilateral.  The patient denies any prodrome but does admit to occasionally (rarely) seeing spots when she has the headache.  She denies any neck surgeries, but she does admit to having had 1 steroid injection done this year, 2021, by [facility] imaging.  According to the patient she was sent over there by [name].  She also admits having had physical therapy for approximately 1 to 2 months, which was stopped approximately 1 months ago.  She describes that neither they injection nor the physical therapy helped her pain.  She indicates having had some x-rays of the cervical spine done last year.  In addition, the patient indicates having occasional shoulder pains, bilaterally, with the left being worse than the right.  She also describes some numbness into fingers, but with no pain or weakness associated with it.  She also indicates that this tends to be intermittent.  The patient specifically denies any lower extremity pain other than in the area of the knees.  She indicated being scheduled for a spine MRI around April 12 at [facility].  In terms of the  patient's medications, she describes taking gabapentin 600 mg p.o. nightly for the pain as well has hydrocodone 5/325 4 to 6 tablets/day for the past 18 years.  She indicates that this was started with her low back pain.  In addition to the above the patient's record indicates that she was also on meloxicam 15 mg p.o. daily, Robaxin 500 mg p.o. 4 times daily, and baclofen 5 mg p.o. twice daily.  Apparently she has stopped the meloxicam, and the baclofen.  Today I took the time to go over the patient's lab results as well as all of her x-rays.  I explained everything to the patient in great detail using layman's terms.   Today I had the patient perform several provocative tests.  The patient was asked to hyperextend and rotate at the level of the lumbar spine.  She indicated that this exactly reproduced her low back pain.  In addition, I had the patient hyperextend and rotate her lumbar spine trying to keep her pelvis and hips in a neutral position.  Towards the right side, it reproduces her right-sided low back pain.  When she reproduce the maneuver towards the left side it also reproduced her pain on the left side.  This confirms that the patient has involvement of bilateral lumbar facet arthropathy.  Following this, we had the patient lay flat on her back and we had her perform a Saralyn Pilar maneuver which was also  positive for bilateral sacroiliac joint pain and bilateral hip arthralgia.  According to the patient's diagnostic x-rays of the lumbar spine with flexion and extension, she does not have any instability but does have worsening of her lower lumbar facet arthropathy when compared to the 2006 x-rays.  At this point, we went over the possible course of action and we have recommended that she come in for a diagnostic bilateral lumbar facet block under fluoroscopic guidance and IV sedation.  I have reminded the patient to remain n.p.o. x8 hours.  I also informed the patient that she will need a driver and  I told her that this type of procedure we normally do with under very mild IV sedation.  She was reminded to take her heart medicine with a sip of water on the morning of the procedure.  In considering the treatment plan options, Ms. Lunsford was reminded that I no longer take patients for medication management only. I asked her to let me know if she had no intention of taking advantage of the interventional therapies, so that we could make arrangements to provide this space to someone interested. I also made it clear that undergoing interventional therapies for the purpose of getting pain medications is very inappropriate on the part of a patient, and it will not be tolerated in this practice. This type of behavior would suggest true addiction and therefore it requires referral to an addiction specialist.   I discussed the assessment and treatment plan with the patient. The patient was provided an opportunity to ask questions and all were answered. The patient agreed with the plan and demonstrated an understanding of the instructions.  Patient advised to call back or seek an in-person evaluation if the symptoms or condition worsens.  Controlled Substance Pharmacotherapy Assessment REMS (Risk Evaluation and Mitigation Strategy)  Analgesic: Hydrocodone/APAP 5/325 1 tab PO BID (10 mg/day of hydrocodone) (last filled on 09/14/2019) Highest recorded MME/day: 30 mg/day MME/day: 10 mg/day   Monitoring: [place] PMP: PDMP not reviewed this encounter.       Not applicable at this point since we have not taken over the patient's medication management yet. List of other Serum/Urine Drug Screening Test(s):  No results found.  List of all UDS test(s) done:  Lab Results  Component Value Date   SUMMARY Note 09/09/2019   Last UDS on record: Summary  Date Value Ref Range Status  09/09/2019 Note  Final    Comment:    ==================================================================== Compliance Drug Analysis,  Ur ==================================================================== Test                             Result       Flag       Units Drug Present   Gabapentin                     PRESENT   Methocarbamol                  PRESENT   Naproxen                       PRESENT   Propranolol                    PRESENT ==================================================================== Test                      Result    Flag  Units      Ref Range   Creatinine              104              mg/dL      >=20 ==================================================================== Declared Medications:  Medication list was not provided. ==================================================================== For clinical consultation, please call [phone]. ====================================================================    UDS interpretation: No unexpected findings.          Medication Assessment Form: Patient introduced to form today Treatment compliance: Treatment may start today if patient agrees with proposed plan. Evaluation of compliance is not applicable at this point Risk Assessment Profile: Aberrant behavior: See initial evaluations. None observed or detected today Comorbid factors increasing risk of overdose: See initial evaluation. No additional risks detected today Opioid risk tool (ORT):  Opioid Risk  09/08/2019  Alcohol 0  Illegal Drugs 0  Rx Drugs 0  Alcohol 0  Illegal Drugs 0  Rx Drugs 0  Age between 16-45 years  0  History of Preadolescent Sexual Abuse 0  Psychological Disease 0  Depression 0  Opioid Risk Tool Scoring 0  Opioid Risk Interpretation Low Risk    ORT Scoring interpretation table:  Score <3 = Low Risk for SUD  Score between 4-7 = Moderate Risk for SUD  Score >8 = High Risk for Opioid Abuse   Risk of substance use disorder (SUD): Low  Risk Mitigation Strategies:  Patient opioid safety counseling: Completed today. Counseling provided to patient as per  "Patient Counseling Document". Document signed by patient, attesting to counseling and understanding Patient-Prescriber Agreement (PPA): Obtained today.  Controlled substance notification to other providers: Written and sent today.  Pharmacologic Plan: Today we may be taking over the patient's pharmacological regimen. See below.             Meds   Current Outpatient Medications:  .  albuterol (VENTOLIN HFA) 108 (90 Base) MCG/ACT inhaler, 2-4 puffs Q4 PRN SOB or tight cough, Disp: , Rfl:  .  gabapentin (NEURONTIN) 300 MG capsule, 600 mg 4 (four) times daily. , Disp: , Rfl:  .  methocarbamol (ROBAXIN) 500 MG tablet, Take 500 mg by mouth 4 (four) times daily. , Disp: , Rfl:  .  propranolol (INDERAL) 10 MG tablet, Take 20 mg by mouth 4 (four) times daily. , Disp: , Rfl:   Laboratory Chemistry Profile   Renal Lab Results  Component Value Date   BUN 9 09/09/2019   CREATININE 0.73 09/09/2019   BCR 12 09/09/2019   GFRAA 109 09/09/2019   GFRNONAA 94 09/09/2019   PROTEINUR 30 (A) 01/05/2018     Electrolytes Lab Results  Component Value Date   NA 139 09/09/2019   K 4.7 09/09/2019   CL 101 09/09/2019   CALCIUM 9.5 09/09/2019   MG 1.8 09/09/2019     Hepatic Lab Results  Component Value Date   AST 16 09/09/2019   ALT 14 02/15/2019   ALBUMIN 4.3 09/09/2019   ALKPHOS 70 09/09/2019   LIPASE 21 01/05/2018     ID No results found.   Bone Lab Results  Component Value Date   25OHVITD1 33 09/09/2019   25OHVITD2 <1.0 09/09/2019   25OHVITD3 33 09/09/2019     Endocrine Lab Results  Component Value Date   GLUCOSE 125 (H) 09/09/2019   GLUCOSEU NEGATIVE 01/05/2018   TSH 0.967 02/15/2019     Neuropathy Lab Results  Component Value Date   VITAMINB12 299 09/09/2019     CNS  No results found.   Inflammation (CRP: Acute  ESR: Chronic) Lab Results  Component Value Date   CRP 2 09/09/2019   ESRSEDRATE 7 09/09/2019     Rheumatology No results found.   Coagulation Lab  Results  Component Value Date   PLT 372 02/15/2019     Cardiovascular Lab Results  Component Value Date   TROPONINI <0.03 01/05/2018   HGB 15.5 (H) 02/15/2019   HCT 45.7 02/15/2019     Screening No results found.   Cancer No results found.   Allergens No results found.     Note: Lab results reviewed.  Recent Diagnostic Imaging Review  Cervical Imaging: Cervical MR wo contrast:  Results for orders placed during the hospital encounter of 02/20/19  MR Cervical Spine Wo Contrast   Narrative CLINICAL DATA:  54 year old female with headache, neck pain and lightheadedness for 2 days.  EXAM: MRI CERVICAL SPINE WITHOUT CONTRAST  TECHNIQUE: Multiplanar, multisequence MR imaging of the cervical spine was performed. No intravenous contrast was administered.  COMPARISON:  Brain MRI today reported separately.  FINDINGS: Alignment: Straightening of lower cervical lordosis. No spondylolisthesis.  Vertebrae: No marrow edema or evidence of acute osseous abnormality. Visualized bone marrow signal is within normal limits.  Cord: No signal abnormality in the cervical spinal cord despite some degenerative mass effect detailed below. Likewise, no signal abnormality in the visible upper thoracic cord.  Posterior Fossa, vertebral arteries, paraspinal tissues: Cervicomedullary junction is within normal limits. Brain findings are reported separately today. Major vascular flow voids in the neck are preserved, the left vertebral artery appears dominant. Negative visible neck soft tissues and lung apices.  Disc levels:  C2-C3:  Negative.  C3-C4: Mild endplate spurring and facet hypertrophy. Mild bilateral C4 foraminal stenosis.  C4-C5: Mild disc bulge and endplate spurring mostly affecting the right neural foramen. Mild right C5 foraminal stenosis.  C5-C6: Circumferential disc bulge with broad-based posterior component and endplate spurring. Mild to moderate facet and  ligament flavum hypertrophy. Spinal stenosis with up to mild spinal cord mass effect. Mild to moderate bilateral C6 foraminal stenosis.  C6-C7: Circumferential disc bulge with broad-based posterior disc protrusion or extrusion with some cephalad migration of disc suspected (series 5, image 7). Mild to moderate facet and ligament flavum hypertrophy. Mild spinal stenosis and up to mild spinal cord mass effect. But no significant foraminal stenosis.  C7-T1: Mild endplate and facet hypertrophy greater on the left. Mild to moderate left C8 foraminal stenosis.  The visible upper thoracic spine is remarkable for a disc bulge with left paracentral disc herniation at T2-T3 seen on series 5, image 8. Up to mild thoracic spinal stenosis there, and multifactorial moderate to severe left T2 foraminal stenosis although related to posterior element hypertrophy to a greater degree than the disc.  IMPRESSION: 1. 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 2. Generally mild degenerative cervical neural foraminal stenosis, up to moderate at the bilateral C6 and left C8 nerve levels. 3. Partially visible upper thoracic disc herniation at T2-T3, with up to mild spinal stenosis there and also multifactorial moderate to severe left T2 foraminal stenosis.   Electronically Signed   By: Genevie Ann M.D.   On: 02/20/2019 15:08    Cervical DG Bending/F/E views:  Results for orders placed during the hospital encounter of 09/09/19  DG Cervical Spine With Flex & Extend   Narrative CLINICAL DATA:  Neck pain. The patient  reports history of a cervical spine fracture in 2020.  EXAM: CERVICAL SPINE COMPLETE WITH FLEXION AND EXTENSION VIEWS  COMPARISON:  MRI cervical spine 02/20/2019.  FINDINGS: Vertebral body height and alignment are normal. Mild anterior endplate spurring is seen at C5-6 and C6-7. There is some  facet degenerative disease at C6-7 and C7-T1. No pathologic motion with flexion or extension. Prevertebral soft tissues appear normal. Lung apices are clear.  IMPRESSION: Mild appearing cervical degenerative disease.  Otherwise negative.   Electronically Signed   By: Inge Rise M.D.   On: 09/09/2019 16:36    Lumbosacral Imaging: Lumbar DG Bending views:  Results for orders placed during the hospital encounter of 09/09/19  DG Lumbar Spine Complete W/Bend   Narrative CLINICAL DATA:  Chronic low back and bilateral knee pain.  EXAM: LUMBAR SPINE - COMPLETE WITH BENDING VIEWS  COMPARISON:  Plain films lumbar spine 03/01/2005.  FINDINGS: Vertebral body height and alignment are maintained. Intervertebral disc space height is normal. Facet degenerative disease at L4-5 and L5-S1 has progressed since the prior exam. Paraspinous structures are unremarkable.  IMPRESSION: Facet arthropathy at L4-5 and L5-S1 shows some progression since 2006. The exam is otherwise negative.   Electronically Signed   By: Inge Rise M.D.   On: 09/09/2019 16:28          Knee Imaging: Knee-R DG 1-2 views:  Results for orders placed during the hospital encounter of 09/09/19  DG Knee 1-2 Views Right   Narrative CLINICAL DATA:  Chronic bilateral knee pain.  No known injury.  EXAM: RIGHT KNEE - 1-2 VIEW; LEFT KNEE - 1-2 VIEW  COMPARISON:  None.  FINDINGS: There is no acute bony or joint abnormality. Joint spaces are preserved. Mild spurring is seen off the superior poles of the right and left patella at the quadriceps tendon insertions. There is minimal peaking of the tibial spines bilaterally. No chondrocalcinosis or joint effusion.  IMPRESSION: No acute abnormality.  Very mild degenerative disease.   Electronically Signed   By: Inge Rise M.D.   On: 09/09/2019 16:31    Knee-L DG 1-2 views:  Results for orders placed during the hospital encounter of 09/09/19  DG  Knee 1-2 Views Left   Narrative CLINICAL DATA:  Chronic bilateral knee pain.  No known injury.  EXAM: RIGHT KNEE - 1-2 VIEW; LEFT KNEE - 1-2 VIEW  COMPARISON:  None.  FINDINGS: There is no acute bony or joint abnormality. Joint spaces are preserved. Mild spurring is seen off the superior poles of the right and left patella at the quadriceps tendon insertions. There is minimal peaking of the tibial spines bilaterally. No chondrocalcinosis or joint effusion.  IMPRESSION: No acute abnormality.  Very mild degenerative disease.   Electronically Signed   By: Inge Rise M.D.   On: 09/09/2019 16:31    Complexity Note: Imaging results reviewed. Results shared with Ms. Kerwin, using Layman's terms.                        Assessment  The primary encounter diagnosis was Chronic low back pain (1ry area of Pain) (Bilateral) (Midline) (L>R) w/o sciatica. Diagnoses of Lumbar facet syndrome (Bilateral), Lumbar facet hypertrophy (Multilevel) (Bilateral), DDD (degenerative disc disease), lumbosacral, Chronic knee pain (2ry area of Pain) (Bilateral) (L>R), Chronic neck pain (3ry area of Pain) (Bilateral) (L>R), Chronic pain syndrome, Chronic sacroiliac joint pain (Bilateral), Somatic dysfunction of sacroiliac joints (Bilateral), Other spondylosis, sacral and sacrococcygeal region, Spondylosis without myelopathy or  radiculopathy, lumbosacral region, and Chronic hip pain (Bilateral) were also pertinent to this visit.  Plan of Care  I am having [name] maintain her albuterol, gabapentin, propranolol, and methocarbamol. Pharmacotherapy (Medications Ordered): No orders of the defined types were placed in this encounter.   Procedure Orders     LUMBAR FACET(MEDIAL BRANCH NERVE BLOCK) MBNB Lab Orders  No laboratory test(s) ordered today    Imaging Orders     DG HIP UNILAT W OR W/O PELVIS 2-3 VIEWS RIGHT     DG HIP UNILAT W OR W/O PELVIS 2-3 VIEWS LEFT     DG Si Joints Referral Orders   No referral(s) requested today   Orders:  Orders Placed This Encounter  Procedures  . LUMBAR FACET(MEDIAL BRANCH NERVE BLOCK) MBNB    Standing Status:   Future    Standing Expiration Date:   10/17/2019    Scheduling Instructions:     Procedure: Lumbar facet block (AKA.: Lumbosacral medial branch nerve block)     Side: Bilateral     Level: L3-4, L4-5, & L5-S1 Facets (L2, L3, L4, L5, & S1 Medial Branch Nerves)     Sedation: Patient's choice.     Timeframe: ASAA    Order Specific Question:   Where will this procedure be performed?    Answer:   [facility]  . DG HIP UNILAT W OR W/O PELVIS 2-3 VIEWS RIGHT    Standing Status:   Future    Standing Expiration Date:   09/16/2020    Scheduling Instructions:     Please describe any evidence of DJD, such as joint narrowing, asymmetry, cysts, or any anomalies in bone density, production, or erosion.    Order Specific Question:   Reason for Exam (SYMPTOM  OR DIAGNOSIS REQUIRED)    Answer:   Right hip pain/arthralgia    Order Specific Question:   Is the patient pregnant?    Answer:   No    Order Specific Question:   Preferred imaging location?    Answer:   [facility]    Order Specific Question:   Call Results- Best Contact Number?    Answer:   [phone] (Pain Clinic facility) (Dr. Dossie Arbour)  . DG HIP UNILAT W OR W/O PELVIS 2-3 VIEWS LEFT    Standing Status:   Future    Standing Expiration Date:   09/16/2020    Scheduling Instructions:     Please describe any evidence of DJD, such as joint narrowing, asymmetry, cysts, or any anomalies in bone density, production, or erosion.    Order Specific Question:   Reason for Exam (SYMPTOM  OR DIAGNOSIS REQUIRED)    Answer:   Right hip pain/arthralgia    Order Specific Question:   Is the patient pregnant?    Answer:   No    Order Specific Question:   Preferred imaging location?    Answer:   [facility]    Order Specific Question:   Call Results- Best Contact Number?    Answer:    [phone] (Pain Clinic facility) (Dr. Dossie Arbour)  . DG Si Joints    Standing Status:   Future    Standing Expiration Date:   12/17/2019    Order Specific Question:   Reason for Exam (SYMPTOM  OR DIAGNOSIS REQUIRED)    Answer:   Right hip pain/arthralgia    Order Specific Question:   Is the patient pregnant?    Answer:   No    Order Specific Question:  Preferred imaging location?    Answer:   [facility]    Order Specific Question:   Call Results- Best Contact Number?    Answer:   [phone] [facility])   Pharmacological management options:  Opioid Analgesics: We'll take over management today. See above orders Membrane stabilizer: Options discussed, including a trial. Muscle relaxant: We have discussed the possibility of a trial NSAID: Trial discussed. Other analgesic(s): To be determined at a later time    Interventional management options: Planned, scheduled, and/or pending:    Diagnostic bilateral lumbar facet block #1    Considering:   Diagnostic left lumbar ESI  Diagnostic bilateral lumbar facet block  Possible bilateral lumbar facet RFA  Diagnostic bilateral intra-articular knee joint injection  Possible bilateral Hyalgan knee injections  Diagnostic bilateral genicular nerve block  Possible bilateral genicular nerve RFA  Diagnostic left cervical ESI  Diagnostic bilateral cervical facet block  Possible bilateral cervical facet RFA    PRN Procedures:   None at this time    Total duration of non-face-to-face encounter: 25 minutes.  Follow-up plan:   Return for Procedure (w/ sedation): (B) L-FCT Blk #1.    Recent Visits Date Type Provider Dept  09/15/19 Telemedicine Milinda Pointer, MD [facility]  09/08/19 Office Visit Milinda Pointer, MD [facility]  Showing recent visits within past 90 days and meeting all other requirements   Today's Visits Date Type Provider Dept  09/17/19 Telemedicine Milinda Pointer, MD  [facility]  Showing today's visits and meeting all other requirements   Future Appointments No visits were found meeting these conditions.  Showing future appointments within next 90 days and meeting all other requirements   Primary Care Physician: System, Pcp Not In Location: Telephone Virtual Visit Note by: Gaspar Cola, MD Date: 09/17/2019; Time: 10:17 AM  Note: This dictation was prepared with Dragon dictation. Any transcriptional errors that may result from this process are unintentional.  Disclaimer:  * Given the special circumstances of the COVID-19 pandemic, the federal government has announced that the Office for Civil Rights (OCR) will exercise its enforcement discretion and will not impose penalties on physicians using telehealth in the event of noncompliance with regulatory requirements under the [place] (HIPAA) in connection with the good faith provision of telehealth during the JEHUD-14 national public health emergency. ([place])

## 2019-09-17 NOTE — Patient Instructions (Signed)
____________________________________________________________________________________________  Preparing for Procedure with Sedation  Procedure appointments are limited to planned procedures: . No Prescription Refills. . No disability issues will be discussed. . No medication changes will be discussed.  Instructions: . Oral Intake: Do not eat or drink anything for at least 3 hours prior to your procedure. (Exception: Blood Pressure Medication. See below.) . Transportation: Unless otherwise stated by your physician, you may drive yourself after the procedure. . Blood Pressure Medicine: Do not forget to take your blood pressure medicine with a sip of water the morning of the procedure. If your Diastolic (lower reading)is above 100 mmHg, elective cases will be cancelled/rescheduled. . Blood thinners: These will need to be stopped for procedures. Notify our staff if you are taking any blood thinners. Depending on which one you take, there will be specific instructions on how and when to stop it. . Diabetics on insulin: Notify the staff so that you can be scheduled 1st case in the morning. If your diabetes requires high dose insulin, take only  of your normal insulin dose the morning of the procedure and notify the staff that you have done so. . Preventing infections: Shower with an antibacterial soap the morning of your procedure. . Build-up your immune system: Take 1000 mg of Vitamin C with every meal (3 times a day) the day prior to your procedure. . Antibiotics: Inform the staff if you have a condition or reason that requires you to take antibiotics before dental procedures. . Pregnancy: If you are pregnant, call and cancel the procedure. . Sickness: If you have a cold, fever, or any active infections, call and cancel the procedure. . Arrival: You must be in the facility at least 30 minutes prior to your scheduled procedure. . Children: Do not bring children with you. . Dress appropriately:  Bring dark clothing that you would not mind if they get stained. . Valuables: Do not bring any jewelry or valuables.  Reasons to call and reschedule or cancel your procedure: (Following these recommendations will minimize the risk of a serious complication.) . Surgeries: Avoid having procedures within 2 weeks of any surgery. (Avoid for 2 weeks before or after any surgery). . Flu Shots: Avoid having procedures within 2 weeks of a flu shots or . (Avoid for 2 weeks before or after immunizations). . Barium: Avoid having a procedure within 7-10 days after having had a radiological study involving the use of radiological contrast. (Myelograms, Barium swallow or enema study). . Heart attacks: Avoid any elective procedures or surgeries for the initial 6 months after a "Myocardial Infarction" (Heart Attack). . Blood thinners: It is imperative that you stop these medications before procedures. Let us know if you if you take any blood thinner.  . Infection: Avoid procedures during or within two weeks of an infection (including chest colds or gastrointestinal problems). Symptoms associated with infections include: Localized redness, fever, chills, night sweats or profuse sweating, burning sensation when voiding, cough, congestion, stuffiness, runny nose, sore throat, diarrhea, nausea, vomiting, cold or Flu symptoms, recent or current infections. It is specially important if the infection is over the area that we intend to treat. . Heart and lung problems: Symptoms that may suggest an active cardiopulmonary problem include: cough, chest pain, breathing difficulties or shortness of breath, dizziness, ankle swelling, uncontrolled high or unusually low blood pressure, and/or palpitations. If you are experiencing any of these symptoms, cancel your procedure and contact your primary care physician for an evaluation.  Remember:  Regular Business hours are:    Monday to Thursday 8:00 AM to 4:00 PM  Provider's  Schedule: Leslie Langille, MD:  Procedure days: Tuesday and Thursday 7:30 AM to 4:00 PM  Bilal Lateef, MD:  Procedure days: Monday and Wednesday 7:30 AM to 4:00 PM ____________________________________________________________________________________________   ____________________________________________________________________________________________  General Risks and Possible Complications  Patient Responsibilities: It is important that you read this as it is part of your informed consent. It is our duty to inform you of the risks and possible complications associated with treatments offered to you. It is your responsibility as a patient to read this and to ask questions about anything that is not clear or that you believe was not covered in this document.  Patient's Rights: You have the right to refuse treatment. You also have the right to change your mind, even after initially having agreed to have the treatment done. However, under this last option, if you wait until the last second to change your mind, you may be charged for the materials used up to that point.  Introduction: Medicine is not an exact science. Everything in Medicine, including the lack of treatment(s), carries the potential for danger, harm, or loss (which is by definition: Risk). In Medicine, a complication is a secondary problem, condition, or disease that can aggravate an already existing one. All treatments carry the risk of possible complications. The fact that a side effects or complications occurs, does not imply that the treatment was conducted incorrectly. It must be clearly understood that these can happen even when everything is done following the highest safety standards.  No treatment: You can choose not to proceed with the proposed treatment alternative. The "PRO(s)" would include: avoiding the risk of complications associated with the therapy. The "CON(s)" would include: not getting any of the treatment  benefits. These benefits fall under one of three categories: diagnostic; therapeutic; and/or palliative. Diagnostic benefits include: getting information which can ultimately lead to improvement of the disease or symptom(s). Therapeutic benefits are those associated with the successful treatment of the disease. Finally, palliative benefits are those related to the decrease of the primary symptoms, without necessarily curing the condition (example: decreasing the pain from a flare-up of a chronic condition, such as incurable terminal cancer).  General Risks and Complications: These are associated to most interventional treatments. They can occur alone, or in combination. They fall under one of the following six (6) categories: no benefit or worsening of symptoms; bleeding; infection; nerve damage; allergic reactions; and/or death. 1. No benefits or worsening of symptoms: In Medicine there are no guarantees, only probabilities. No healthcare provider can ever guarantee that a medical treatment will work, they can only state the probability that it may. Furthermore, there is always the possibility that the condition may worsen, either directly, or indirectly, as a consequence of the treatment. 2. Bleeding: This is more common if the patient is taking a blood thinner, either prescription or over the counter (example: Goody Powders, Fish oil, Aspirin, Garlic, etc.), or if suffering a condition associated with impaired coagulation (example: Hemophilia, cirrhosis of the liver, low platelet counts, etc.). However, even if you do not have one on these, it can still happen. If you have any of these conditions, or take one of these drugs, make sure to notify your treating physician. 3. Infection: This is more common in patients with a compromised immune system, either due to disease (example: diabetes, cancer, human immunodeficiency virus [HIV], etc.), or due to medications or treatments (example: therapies used to treat  cancer and   rheumatological diseases). However, even if you do not have one on these, it can still happen. If you have any of these conditions, or take one of these drugs, make sure to notify your treating physician. 4. Nerve Damage: This is more common when the treatment is an invasive one, but it can also happen with the use of medications, such as those used in the treatment of cancer. The damage can occur to small secondary nerves, or to large primary ones, such as those in the spinal cord and brain. This damage may be temporary or permanent and it may lead to impairments that can range from temporary numbness to permanent paralysis and/or brain death. 5. Allergic Reactions: Any time a substance or material comes in contact with our body, there is the possibility of an allergic reaction. These can range from a mild skin rash (contact dermatitis) to a severe systemic reaction (anaphylactic reaction), which can result in death. 6. Death: In general, any medical intervention can result in death, most of the time due to an unforeseen complication. ____________________________________________________________________________________________   

## 2019-09-19 ENCOUNTER — Ambulatory Visit
Admission: RE | Admit: 2019-09-19 | Discharge: 2019-09-19 | Disposition: A | Discharge status: Home / Self Care | Payer: BC Managed Care – PPO | Attending: Pain Medicine | Admitting: Pain Medicine

## 2019-09-19 ENCOUNTER — Ambulatory Visit
Admission: RE | Admit: 2019-09-19 | Discharge: 2019-09-19 | Disposition: A | Discharge status: Home / Self Care | Payer: BC Managed Care – PPO | Source: Ambulatory Visit | Attending: Pain Medicine | Admitting: Pain Medicine

## 2019-09-19 DIAGNOSIS — G8929 Other chronic pain: Secondary | ICD-10-CM | POA: Diagnosis present

## 2019-09-19 DIAGNOSIS — M9904 Segmental and somatic dysfunction of sacral region: Secondary | ICD-10-CM | POA: Insufficient documentation

## 2019-09-19 DIAGNOSIS — M533 Sacrococcygeal disorders, not elsewhere classified: Secondary | ICD-10-CM | POA: Diagnosis present

## 2019-09-19 DIAGNOSIS — M25552 Pain in left hip: Secondary | ICD-10-CM

## 2019-09-19 DIAGNOSIS — M545 Low back pain, unspecified: Secondary | ICD-10-CM

## 2019-09-19 DIAGNOSIS — M25551 Pain in right hip: Secondary | ICD-10-CM | POA: Insufficient documentation

## 2019-09-30 ENCOUNTER — Telehealth: Payer: Self-pay | Admitting: *Deleted

## 2019-09-30 NOTE — Telephone Encounter (Signed)
Pharmacy called to check on last refill. Last filled for #[id number] to be taken BID on 04/04. Should have enough to last until 05/04. Told patient that she will need an appointment to discuss refill with Dr. Laban Emperor. NO PHONE REFILLS was explained.

## 2019-10-06 ENCOUNTER — Other Ambulatory Visit: Payer: Self-pay | Admitting: Pain Medicine

## 2019-10-06 ENCOUNTER — Encounter: Payer: Self-pay | Admitting: Pain Medicine

## 2019-10-06 ENCOUNTER — Telehealth: Payer: Self-pay | Admitting: *Deleted

## 2019-10-06 NOTE — Progress Notes (Signed)
Patient: Natasha Hanson  Service Category: E/M  Provider: Gaspar Cola, MD  DOB: 1966/02/20  DOS: 10/07/2019  Location: Office  MRN: [id number]  Setting: Ambulatory outpatient  Referring Provider: No ref. provider found  Type: Established Patient  Specialty: Interventional Pain Management  PCP: System, Pcp Not In  Location: Remote location  Delivery: TeleHealth     Virtual Encounter - Pain Management PROVIDER NOTE: Information contained herein reflects review and annotations entered in association with encounter. Interpretation of such information and data should be left to medically-trained personnel. Information provided to patient can be located elsewhere in the medical record under "Patient Instructions". Document created using STT-dictation technology, any transcriptional errors that may result from process are unintentional.    Contact & Pharmacy Preferred: [phone] Home: [phone] (home) Mobile: [phone] (mobile) E-mail: [email or web address], [place] [address] Phone: [phone] Fax: [phone]   Pre-screening  Natasha Hanson offered "in-person" vs "virtual" encounter. She indicated preferring virtual for this encounter.   Reason COVID-19*  Social distancing based on CDC and AMA recommendations.   I contacted Natasha Hanson on 10/07/2019 via telephone.      I clearly identified myself as Gaspar Cola, MD. I verified that I was speaking with the correct person using two identifiers (Name: Natasha Hanson, and date of birth: 12-Sep-1965).  Consent I sought verbal advanced consent from Natasha Hanson for virtual visit interactions. I informed Natasha Hanson of possible security and privacy concerns, risks, and limitations associated with providing "not-in-person" medical evaluation and management services. I also informed Natasha Hanson of the availability of "in-person"  appointments. Finally, I informed her that there would be a charge for the virtual visit and that she could be  personally, fully or partially, financially responsible for it. Ms. Carie expressed understanding and agreed to proceed.   Historic Elements   Natasha Hanson is a 54 y.o. year old, female patient evaluated today after her last contact with our practice on 10/06/2019. Natasha Hanson  has no past medical history on file. She also  has a past surgical history that includes Refractive surgery; Abdominal hysterectomy; and Foot surgery. Natasha Hanson has a current medication list which includes the following prescription(s): albuterol, gabapentin, methocarbamol, oxycodone, propranolol, gabapentin, magnesium oxide, meloxicam, and turmeric. She  reports that she has been smoking. She has never used smokeless tobacco. She reports that she does not drink alcohol. No history on file for drug. Natasha Hanson has No Known Allergies.   HPI  Today, she is being contacted for follow-up evaluation.  This appointment was arranged as a request from the patient since she indicates having ran out of medication.  In reviewing her PMP, she should have had some of her hydrocodone/APAP 5/325 left since the instructions on the prescription set take 1 tablet p.o. twice daily.  She now tells me that the reason why she ran out is because the way that she takes it is 1 tablet p.o. 4 times daily and occasionally she will take as many as 6/day.  I took this opportunity to give the patient a warning about our prescriptions and how she is supposed to take them.  She was told that she is not allowed to take more medication than prescribed, under no circumstances.  She was also informed that doing so could mean that we will no longer prescribe the medicine.  Today we will make some changes  on her medication management based on her condition.  The patient's pain is a mixture of somatic pain, noted allopathic/neurogenic pain, as well as arthralgia  secondary to chronic inflammatory processes.  Because of this, we will not attempt to control this pain with one single type of medication since this would make no sense and be very ineffective.  For the myofascial component of her pain we will be managing it with the Robaxin that we know she can tolerate.  However, I will be changing her dose from the 500 mg p.o. 4 times daily to 750 mg p.o. 3 times daily.  In addition, I will be adding over-the-counter magnesium 500 mg to be taken 1 tablet p.o. at bedtime and 1 daily in a.m. to assist with the myofascial component of her pain.  This medication in addition should help along with calcium and vitamin D to strengthen her bones and prevent osteoporosis.  For the neurogenic/neuropathic component to her pain we will keep the patient on the gabapentin 600 mg, which we will be taking over today.  Later on we may consider making some changes to the dose, regimen, or the medication depending on its effectiveness.  For the arthropathic component of her pain we will be adding an NSAID in the form of meloxicam 15 mg p.o. daily and we will supplement this with over-the-counter turmeric 500 mg p.o. daily.  As to the somatic component of the pain, we will manage this with oxycodone IR instead of hydrocodone/APAP, so asked to eliminate the acetaminophen and then mixture.  Today I have provided the patient with the appropriate information regarding our medication rules and regulations through "MY CHART" system and the patient information section.  The patient is scheduled to return on 10/21/2019 for a diagnostic bilateral lumbar facet block under fluoroscopic guidance and IV sedation.  Based on the patient's imaging studies, and the patient's history and symptoms of her pain, it is very likely that this is secondary to a facet syndrome.  If the diagnostic blocks were to confirm this, then we will consider the patient for possible radiofrequency ablation.  Once we begin to  decrease her sources of pain, then we will modify her medications accordingly so as to minimize her pharmacotherapy.  Today I had the opportunity to review the results of her hip and SI joint x-rays, all of which were negative, again reinforcing the possibility that the pain that she is experiencing in those areas is secondary to the facet joints.  Pharmacotherapy Assessment  Analgesic: Oxycodone IR 5 mg every 8 hours (15 mg/day of oxycodone) Highest recorded MME/day: 30 mg/day MME/day: 22.5 mg/day   Monitoring: [place] PMP: PDMP reviewed during this encounter.       Pharmacotherapy: No side-effects or adverse reactions reported. Compliance: No problems identified. Effectiveness: Clinically acceptable. Plan: Refer to "POC".  UDS:  Summary  Date Value Ref Range Status  09/09/2019 Note  Final    Comment:    ==================================================================== Compliance Drug Analysis, Ur ==================================================================== Test                             Result       Flag       Units Drug Present   Gabapentin                     PRESENT   Methocarbamol  PRESENT   Naproxen                       PRESENT   Propranolol                    PRESENT ==================================================================== Test                      Result    Flag   Units      Ref Range   Creatinine              104              mg/dL      >=20 ==================================================================== Declared Medications:  Medication list was not provided. ==================================================================== For clinical consultation, please call [phone]. ====================================================================    Laboratory Chemistry Profile   Renal Lab Results  Component Value Date   BUN 9 09/09/2019   CREATININE 0.73 09/09/2019   BCR 12 09/09/2019   GFRAA 109 09/09/2019    GFRNONAA 94 09/09/2019     Hepatic Lab Results  Component Value Date   AST 16 09/09/2019   ALT 14 02/15/2019   ALBUMIN 4.3 09/09/2019   ALKPHOS 70 09/09/2019   LIPASE 21 01/05/2018     Electrolytes Lab Results  Component Value Date   NA 139 09/09/2019   K 4.7 09/09/2019   CL 101 09/09/2019   CALCIUM 9.5 09/09/2019   MG 1.8 09/09/2019     Bone Lab Results  Component Value Date   25OHVITD1 33 09/09/2019   25OHVITD2 <1.0 09/09/2019   25OHVITD3 33 09/09/2019     Inflammation (CRP: Acute Phase) (ESR: Chronic Phase) Lab Results  Component Value Date   CRP 2 09/09/2019   ESRSEDRATE 7 09/09/2019       Note: Above Lab results reviewed.  Imaging  DG Si Joints CLINICAL DATA:  Chronic bilateral hip pain.  No known injury.  EXAM: BILATERAL SACROILIAC JOINTS - 3+ VIEW  COMPARISON:  None.  FINDINGS: The sacroiliac joint spaces are maintained and there is no evidence of arthropathy. No other bone abnormalities are seen.  IMPRESSION: Negative.  Electronically Signed   By: Dorise Bullion III M.D   On: 09/20/2019 17:52 DG HIP UNILAT W OR W/O PELVIS 2-3 VIEWS LEFT CLINICAL DATA:  Pain.  No known injury.  EXAM: DG HIP (WITH OR WITHOUT PELVIS) 2-3V LEFT  COMPARISON:  None.  FINDINGS: There is no evidence of hip fracture or dislocation. There is no evidence of arthropathy or other focal bone abnormality.  IMPRESSION: Negative.  Electronically Signed   By: Dorise Bullion III M.D   On: 09/20/2019 17:49 DG HIP UNILAT W OR W/O PELVIS 2-3 VIEWS RIGHT CLINICAL DATA:  Right hip pain.  No injury.  EXAM: DG HIP (WITH OR WITHOUT PELVIS) 2-3V RIGHT  COMPARISON:  None.  FINDINGS: There is no evidence of hip fracture or dislocation. There is no evidence of arthropathy or other focal bone abnormality.  IMPRESSION: Negative.  Electronically Signed   By: Dorise Bullion III M.D   On: 09/20/2019 17:48  Assessment  The primary encounter diagnosis was Chronic  pain syndrome. Diagnoses of Chronic low back pain (1ry area of Pain) (Bilateral) (Midline) (L>R) w/o sciatica, Chronic knee pain (2ry area of Pain) (Bilateral) (L>R), Chronic neck pain (3ry area of Pain) (Bilateral) (L>R), Long term prescription opiate use, Neurogenic pain, Chronic musculoskeletal pain, and Osteoarthritis of the knees (Bilateral) were also pertinent  to this visit.  Plan of Care  Problem-specific:  No problem-specific Assessment & Plan notes found for this encounter.  Natasha Hanson has a current medication list which includes the following long-term medication(s): albuterol, gabapentin, methocarbamol, oxycodone, propranolol, gabapentin, and meloxicam.  Pharmacotherapy (Medications Ordered): Meds ordered this encounter  Medications  . oxyCODONE (OXY IR/ROXICODONE) 5 MG immediate release tablet    Sig: Take 1 tablet (5 mg total) by mouth every 8 (eight) hours as needed for severe pain. Must last 30 days.    Dispense:  90 tablet    Refill:  0    Chronic Pain. (STOP Act - Not applicable). Fill one day early if closed on scheduled refill date. Do not fill until: 10/07/2019. To last until: 11/06/2019.  . methocarbamol (ROBAXIN) 750 MG tablet    Sig: Take 1 tablet (750 mg total) by mouth 3 (three) times daily.    Dispense:  90 tablet    Refill:  0    Fill one day early if pharmacy is closed on scheduled refill date. May substitute for generic, or similar, if available.  . gabapentin (NEURONTIN) 600 MG tablet    Sig: Take 1 tablet (600 mg total) by mouth in the morning, at noon, in the evening, and at bedtime.    Dispense:  120 tablet    Refill:  0    Fill one day early if pharmacy is closed on scheduled refill date. May substitute for generic if available.  . meloxicam (MOBIC) 15 MG tablet    Sig: Take 1 tablet (15 mg total) by mouth daily.    Dispense:  30 tablet    Refill:  0    Do not add this medication to the electronic "Automatic Refill" notification system. Patient  may have prescription filled one day early if pharmacy is closed on scheduled refill date.  . Turmeric 500 MG CAPS    Sig: Take 500 mg by mouth daily.    Dispense:  90 capsule    Refill:  0    OTC Recommendation. Please provide information on: where to find; how to take; side-effects; adverse reactions; drug-to-drug interactions; and contraindications. If unavailable, recommend similar substitute.  . Magnesium Oxide 500 MG CAPS    Sig: Take 1 capsule (500 mg total) by mouth 2 (two) times daily at 8 am and 10 pm.    Dispense:  180 capsule    Refill:  0    Fill one day early if pharmacy is closed on scheduled refill date. May substitute for generic if available.   Orders:  Orders Placed This Encounter  Procedures  . Nursing Instructions:    1. Medication Agreement: Please go over agreement with the patient. Have the patient read and sign the agreement. Provide patient with a copy of the signed agreement. 2. Make sure that the patient has completed the ORT (Opioid Risk Tool). 3. Provide the patient with a copy of our "Medicatiion Policy", "Medication Recommendations and Reminders", and "CBD information". 4. Remind the patient to always bring their medications and medication bottles (even if empty) to all appointments except for procedure appointments.    Scheduling Instructions:     Sign "Medication Agreement", complete "Opioid Risk Tool", inform patient of our practice "Medication Policies" (Pill counts, always bring bottles, except on procedure days).  . Nursing communication    Scheduling Instructions:     Complete/update the opioid risk tool (ORT) questionnaire.   Follow-up plan:   Return in about 1 month (around 11/06/2019)  for (F2F), (MM), in addition, Procedure (w/ sedation): (B) L-FCT Blk #1.      Interventional management options: Planned, scheduled, and/or pending:    Diagnostic bilateral lumbar facet block #1    Considering:   Diagnostic left lumbar ESI  Diagnostic bilateral  lumbar facet block  Possible bilateral lumbar facet RFA  Diagnostic bilateral intra-articular knee joint injection  Possible bilateral Hyalgan knee injections  Diagnostic bilateral genicular nerve block  Possible bilateral genicular nerve RFA  Diagnostic left cervical ESI  Diagnostic bilateral cervical facet block  Possible bilateral cervical facet RFA    PRN Procedures:   None at this time    Recent Visits Date Type Provider Dept  09/17/19 Telemedicine Milinda Pointer, MD [facility]  09/15/19 Telemedicine Milinda Pointer, MD [facility]  09/08/19 Office Visit Milinda Pointer, MD [facility]  Showing recent visits within past 90 days and meeting all other requirements   Today's Visits Date Type Provider Dept  10/07/19 Telemedicine Milinda Pointer, MD [facility]  Showing today's visits and meeting all other requirements   Future Appointments Date Type Provider Dept  10/21/19 Appointment Milinda Pointer, MD [facility]  Showing future appointments within next 90 days and meeting all other requirements   I discussed the assessment and treatment plan with the patient. The patient was provided an opportunity to ask questions and all were answered. The patient agreed with the plan and demonstrated an understanding of the instructions.  Patient advised to call back or seek an in-person evaluation if the symptoms or condition worsens.  Duration of encounter: 18 minutes.  Note by: Gaspar Cola, MD Date: 10/07/2019; Time: 5:23 PM

## 2019-10-06 NOTE — Telephone Encounter (Signed)
Called patient back to let her know what Dr Laban Emperor said.  Let her know that he had found on the PMP that she had been written hydrocodone on 09/14/19 to last for 30 days 1 tab every 12 hours prn pain.  She stated that she told the Dr at [facility] center.  She states that she has to take the medicine at least 4 - 6 times per day.  Explained to the patient that she must take her medication as prescribed or she will run out just like has happened.  I did tell her that Dr Laban Emperor had approved to add her on to the schedule tomorrow, 10/07/19 at the very end of the day and she could discuss this with him.

## 2019-10-06 NOTE — Telephone Encounter (Signed)
Returned patient's call.  She states she is out of medication and that Dr Laban Emperor said he would prescribe for her.  I do see where he states he will take over medications from appt on 09/17/19,  Patient does not have an appt until 10/20/19 for VV.  States Dr Alphonzo Lemmings will no longer prescribed.  I told patient I would send Dr Laban Emperor a message and let her know his response.

## 2019-10-06 NOTE — Telephone Encounter (Signed)
Please return the pts call and explain to her about the refills she's requesting. Pt stated that the returned call she received on 09/30/19 she did not understand.                                                        Thanks

## 2019-10-07 ENCOUNTER — Ambulatory Visit: Payer: Self-pay | Attending: Pain Medicine | Admitting: Pain Medicine

## 2019-10-07 ENCOUNTER — Other Ambulatory Visit: Payer: Self-pay

## 2019-10-07 DIAGNOSIS — M25562 Pain in left knee: Secondary | ICD-10-CM

## 2019-10-07 DIAGNOSIS — M170 Bilateral primary osteoarthritis of knee: Secondary | ICD-10-CM

## 2019-10-07 DIAGNOSIS — M25561 Pain in right knee: Secondary | ICD-10-CM

## 2019-10-07 DIAGNOSIS — M792 Neuralgia and neuritis, unspecified: Secondary | ICD-10-CM | POA: Insufficient documentation

## 2019-10-07 DIAGNOSIS — Z79891 Long term (current) use of opiate analgesic: Secondary | ICD-10-CM

## 2019-10-07 DIAGNOSIS — G894 Chronic pain syndrome: Secondary | ICD-10-CM

## 2019-10-07 DIAGNOSIS — M542 Cervicalgia: Secondary | ICD-10-CM

## 2019-10-07 DIAGNOSIS — M545 Low back pain: Secondary | ICD-10-CM

## 2019-10-07 DIAGNOSIS — G8929 Other chronic pain: Secondary | ICD-10-CM

## 2019-10-07 DIAGNOSIS — M7918 Myalgia, other site: Secondary | ICD-10-CM | POA: Insufficient documentation

## 2019-10-07 MED ORDER — TURMERIC 500 MG PO CAPS: 500.0000 mg | ORAL_CAPSULE | Freq: Every day | ORAL | 0 refills | Status: DC

## 2019-10-07 MED ORDER — OXYCODONE HCL 5 MG PO TABS: 5.0000 mg | ORAL_TABLET | Freq: Three times a day (TID) | ORAL | 0 refills | Status: DC | PRN

## 2019-10-07 MED ORDER — GABAPENTIN 600 MG PO TABS: 600.0000 mg | ORAL_TABLET | Freq: Four times a day (QID) | ORAL | 0 refills | Status: DC

## 2019-10-07 MED ORDER — MELOXICAM 15 MG PO TABS: 15.0000 mg | ORAL_TABLET | Freq: Every day | ORAL | 0 refills | Status: DC

## 2019-10-07 MED ORDER — MAGNESIUM OXIDE -MG SUPPLEMENT 500 MG PO CAPS: 1.0000 | ORAL_CAPSULE | Freq: Two times a day (BID) | ORAL | 0 refills | Status: DC

## 2019-10-07 MED ORDER — METHOCARBAMOL 750 MG PO TABS: 750.0000 mg | ORAL_TABLET | Freq: Three times a day (TID) | ORAL | 0 refills | Status: DC

## 2019-10-07 NOTE — Patient Instructions (Addendum)
____________________________________________________________________________________________  Preparing for Procedure with Sedation  Procedure appointments are limited to planned procedures: . No Prescription Refills. . No disability issues will be discussed. . No medication changes will be discussed.  Instructions: . Oral Intake: Do not eat or drink anything for at least 3 hours prior to your procedure. (Exception: Blood Pressure Medication. See below.) . Transportation: Unless otherwise stated by your physician, you may drive yourself after the procedure. . Blood Pressure Medicine: Do not forget to take your blood pressure medicine with a sip of water the morning of the procedure. If your Diastolic (lower reading)is above 100 mmHg, elective cases will be cancelled/rescheduled. . Blood thinners: These will need to be stopped for procedures. Notify our staff if you are taking any blood thinners. Depending on which one you take, there will be specific instructions on how and when to stop it. . Diabetics on insulin: Notify the staff so that you can be scheduled 1st case in the morning. If your diabetes requires high dose insulin, take only  of your normal insulin dose the morning of the procedure and notify the staff that you have done so. . Preventing infections: Shower with an antibacterial soap the morning of your procedure. . Build-up your immune system: Take 1000 mg of Vitamin C with every meal (3 times a day) the day prior to your procedure. . Antibiotics: Inform the staff if you have a condition or reason that requires you to take antibiotics before dental procedures. . Pregnancy: If you are pregnant, call and cancel the procedure. . Sickness: If you have a cold, fever, or any active infections, call and cancel the procedure. . Arrival: You must be in the facility at least 30 minutes prior to your scheduled procedure. . Children: Do not bring children with you. . Dress appropriately:  Bring dark clothing that you would not mind if they get stained. . Valuables: Do not bring any jewelry or valuables.  Reasons to call and reschedule or cancel your procedure: (Following these recommendations will minimize the risk of a serious complication.) . Surgeries: Avoid having procedures within 2 weeks of any surgery. (Avoid for 2 weeks before or after any surgery). . Flu Shots: Avoid having procedures within 2 weeks of a flu shots or . (Avoid for 2 weeks before or after immunizations). . Barium: Avoid having a procedure within 7-10 days after having had a radiological study involving the use of radiological contrast. (Myelograms, Barium swallow or enema study). . Heart attacks: Avoid any elective procedures or surgeries for the initial 6 months after a "Myocardial Infarction" (Heart Attack). . Blood thinners: It is imperative that you stop these medications before procedures. Let us know if you if you take any blood thinner.  . Infection: Avoid procedures during or within two weeks of an infection (including chest colds or gastrointestinal problems). Symptoms associated with infections include: Localized redness, fever, chills, night sweats or profuse sweating, burning sensation when voiding, cough, congestion, stuffiness, runny nose, sore throat, diarrhea, nausea, vomiting, cold or Flu symptoms, recent or current infections. It is specially important if the infection is over the area that we intend to treat. . Heart and lung problems: Symptoms that may suggest an active cardiopulmonary problem include: cough, chest pain, breathing difficulties or shortness of breath, dizziness, ankle swelling, uncontrolled high or unusually low blood pressure, and/or palpitations. If you are experiencing any of these symptoms, cancel your procedure and contact your primary care physician for an evaluation.  Remember:  Regular Business hours are:    Monday to Thursday 8:00 AM to 4:00 PM  Provider's  Schedule: Delano MetzFrancisco Azaela Caracci, MD:  Procedure days: Tuesday and Thursday 7:30 AM to 4:00 PM  Edward JollyBilal Lateef, MD:  Procedure days: Monday and Wednesday 7:30 AM to 4:00 PM ____________________________________________________________________________________________   ____________________________________________________________________________________________  Medication Rules  Purpose: To inform patients, and their family members, of our rules and regulations.  Applies to: All patients receiving prescriptions (written or electronic).  Pharmacy of record: Pharmacy where electronic prescriptions will be sent. If written prescriptions are taken to a different pharmacy, please inform the nursing staff. The pharmacy listed in the electronic medical record should be the one where you would like electronic prescriptions to be sent.  Electronic prescriptions: In compliance with the [facility] Strengthen Opioid Misuse Prevention (STOP) Act of 2017 (Session [place] ElliotLaw [phone]), effective June 12, 2018, all controlled substances must be electronically prescribed. Calling prescriptions to the pharmacy will cease to exist.  Prescription refills: Only during scheduled appointments. Applies to all prescriptions.  NOTE: The following applies primarily to controlled substances (Opioid* Pain Medications).   Patient's responsibilities: 1. Pain Pills: Bring all pain pills to every appointment (except for procedure appointments). 2. Pill Bottles: Bring pills in original pharmacy bottle. Always bring the newest bottle. Bring bottle, even if empty. 3. Medication refills: You are responsible for knowing and keeping track of what medications you take and those you need refilled. The day before your appointment: write a list of all prescriptions that need to be refilled. The day of the appointment: give the list to the admitting nurse. Prescriptions will be written only during appointments. No prescriptions will be  written on procedure days. If you forget a medication: it will not be "Called in", "Faxed", or "electronically sent". You will need to get another appointment to get these prescribed. No early refills. Do not call asking to have your prescription filled early. 4. Prescription Accuracy: You are responsible for carefully inspecting your prescriptions before leaving our office. Have the discharge nurse carefully go over each prescription with you, before taking them home. Make sure that your name is accurately spelled, that your address is correct. Check the name and dose of your medication to make sure it is accurate. Check the number of pills, and the written instructions to make sure they are clear and accurate. Make sure that you are given enough medication to last until your next medication refill appointment. 5. Taking Medication: Take medication as prescribed. When it comes to controlled substances, taking less pills or less frequently than prescribed is permitted and encouraged. Never take more pills than instructed. Never take medication more frequently than prescribed.  6. Inform other Doctors: Always inform, all of your healthcare providers, of all the medications you take. 7. Pain Medication from other Providers: You are not allowed to accept any additional pain medication from any other Doctor or Healthcare provider. There are two exceptions to this rule. (see below) In the event that you require additional pain medication, you are responsible for notifying us, as stated below. 8. Medication Agreement: You are responsible for carefully reading and following our Medication Agreement. This must be signed before receiving any prescriptions from our practice. Safely store a copy of your signed Agreement. Violations to the Agreement will result in no further prescriptions. (Additional copies of our Medication Agreement are available upon request.) 9. Laws, Rules, & Regulations: All patients are  expected to follow all [address] and [place] Disney[place] Laws, [employer]utes, Rules, [employer] Regulations. Ignorance of the Laws does not constitute a  valid excuse.  10. Illegal drugs and Controlled Substances: The use of illegal substances (including, but not limited to marijuana and its derivatives) and/or the illegal use of any controlled substances is strictly prohibited. Violation of this rule may result in the immediate and permanent discontinuation of any and all prescriptions being written by our practice. The use of any illegal substances is prohibited. 11. Adopted CDC guidelines & recommendations: Target dosing levels will be at or below 60 MME/day. Use of benzodiazepines** is not recommended.  Exceptions: There are only two exceptions to the rule of not receiving pain medications from other Healthcare Providers. 1. Exception #1 (Emergencies): In the event of an emergency (i.e.: accident requiring emergency care), you are allowed to receive additional pain medication. However, you are responsible for: As soon as you are able, call our office [phone], at any time of the day or night, and leave a message stating your name, the date and nature of the emergency, and the name and dose of the medication prescribed. In the event that your call is answered by a member of our staff, make sure to document and save the date, time, and the name of the person that took your information.  2. Exception #2 (Planned Surgery): In the event that you are scheduled by another doctor or dentist to have any type of surgery or procedure, you are allowed (for a period no longer than 30 days), to receive additional pain medication, for the acute post-op pain. However, in this case, you are responsible for picking up a copy of our "Post-op Pain Management for Surgeons" handout, and giving it to your surgeon or dentist. This document is available at our office, and does not require an appointment to obtain it. Simply go to our office during  business hours (Monday-Thursday from 8:00 AM to 4:00 PM) (Friday 8:00 AM to 12:00 Noon) or if you have a scheduled appointment with [place], prior to your surgery, and ask for it by name. In addition, you will need to provide [place] with your name, name of your surgeon, type of surgery, and date of procedure or surgery.  *Opioid medications include: morphine, codeine, oxycodone, oxymorphone, hydrocodone, hydromorphone, meperidine, tramadol, tapentadol, buprenorphine, fentanyl, methadone. **Benzodiazepine medications include: diazepam (Valium), alprazolam (Xanax), clonazepam (Klonopine), lorazepam (Ativan), clorazepate (Tranxene), chlordiazepoxide (Librium), estazolam (Prosom), oxazepam (Serax), temazepam (Restoril), triazolam (Halcion) (Last updated: 08/09/2017) ____________________________________________________________________________________________   ____________________________________________________________________________________________  Medication Recommendations and Reminders  Applies to: All patients receiving prescriptions (written and/or electronic).  Medication Rules & Regulations: These rules and regulations exist for your safety and that of others. They are not flexible and neither are we. Dismissing or ignoring them will be considered "non-compliance" with medication therapy, resulting in complete and irreversible termination of such therapy. (See document titled "Medication Rules" for more details.) In all conscience, because of safety reasons, we cannot continue providing a therapy where the patient does not follow instructions.  Pharmacy of record:   Definition: This is the pharmacy where your electronic prescriptions will be sent.   We do not endorse any particular pharmacy.  You are not restricted in your choice of pharmacy.  The pharmacy listed in the electronic medical record should be the one where you want electronic prescriptions to be sent.  If you choose to change  pharmacy, simply notify our nursing staff of your choice of new pharmacy.  Recommendations:  Keep all of your pain medications in a safe place, under lock and key, even if you live alone.   After  you fill your prescription, take 1 week's worth of pills and put them away in a safe place. You should keep a separate, properly labeled bottle for this purpose. The remainder should be kept in the original bottle. Use this as your primary supply, until it runs out. Once it's gone, then you know that you have 1 week's worth of medicine, and it is time to come in for a prescription refill. If you do this correctly, it is unlikely that you will ever run out of medicine.  To make sure that the above recommendation works, it is very important that you make sure your medication refill appointments are scheduled at least 1 week before you run out of medicine. To do this in an effective manner, make sure that you do not leave the office without scheduling your next medication management appointment. Always ask the nursing staff to show you in your prescription , when your medication will be running out. Then arrange for the receptionist to get you a return appointment, at least 7 days before you run out of medicine. Do not wait until you have 1 or 2 pills left, to come in. This is very poor planning and does not take into consideration that we may need to cancel appointments due to bad weather, sickness, or emergencies affecting our staff.  "Partial Fill": If for any reason your pharmacy does not have enough pills/tablets to completely fill or refill your prescription, do not allow for a "partial fill". You will need a separate prescription to fill the remaining amount, which we will not provide. If the reason for the partial fill is your insurance, you will need to talk to the pharmacist about payment alternatives for the remaining tablets, but again, do not accept a partial fill.  Prescription refills and/or changes  in medication(s):   Prescription refills, and/or changes in dose or medication, will be conducted only during scheduled medication management appointments. (Applies to both, written and electronic prescriptions.)  No refills on procedure days. No medication will be changed or started on procedure days. No changes, adjustments, and/or refills will be conducted on a procedure day. Doing so will interfere with the diagnostic portion of the procedure.  No phone refills. No medications will be "called into the pharmacy".  No Fax refills.  No weekend refills.  No Holliday refills.  No after hours refills.  Remember:  Business hours are:  Monday to Thursday 8:00 AM to 4:00 PM Provider's Schedule: Delano Metz, MD - Appointments are:  Medication management: Monday and Wednesday 8:00 AM to 4:00 PM Procedure day: Tuesday and Thursday 7:30 AM to 4:00 PM Edward Jolly, MD - Appointments are:  Medication management: Tuesday and Thursday 8:00 AM to 4:00 PM Procedure day: Monday and Wednesday 7:30 AM to 4:00 PM (Last update: 08/09/2017) ____________________________________________________________________________________________   ____________________________________________________________________________________________  CANNABIDIOL (AKA: CBD Oil or Pills)  Applies to: All patients receiving prescriptions of controlled substances (written and/or electronic).  General Information: Cannabidiol (CBD) was discovered in 28. It is one of some 113 identified cannabinoids in cannabis (Marijuana) plants, accounting for up to 40% of the plant's extract. As of 2018, preliminary clinical research on cannabidiol included studies of anxiety, cognition, movement disorders, and pain.  Cannabidiol is consummed in multiple ways, including inhalation of cannabis smoke or vapor, as an aerosol spray into the cheek, and by mouth. It may be supplied as CBD oil containing CBD as the active ingredient (no added  tetrahydrocannabinol (THC) or terpenes), a full-plant CBD-dominant hemp extract  oil, capsules, dried cannabis, or as a liquid solution. CBD is thought not have the same psychoactivity as THC, and may affect the actions of THC. Studies suggest that CBD may interact with different biological targets, including cannabinoid receptors and other neurotransmitter receptors. As of 2018 the mechanism of action for its biological effects has not been determined.  In the [place], cannabidiol has a limited approval by the Food and Drug Administration (FDA) for treatment of only two types of epilepsy disorders. The side effects of long-term use of the drug include somnolence, decreased appetite, diarrhea, fatigue, malaise, weakness, sleeping problems, and others.  CBD remains a Schedule I drug prohibited for any use.  Legality: Some manufacturers ship CBD products nationally, an illegal action which the FDA has not enforced in 2018, with CBD remaining the subject of an FDA investigational new drug evaluation, and is not considered legal as a dietary supplement or food ingredient as of December 2018. Federal illegality has made it difficult historically to conduct research on CBD. CBD is openly sold in head shops and health food stores in some states where such sales have not been explicitly legalized.  Warning: Because it is not FDA approved for general use or treatment of pain, it is not required to undergo the same manufacturing controls as prescription drugs.  This means that the available cannabidiol (CBD) may be contaminated with THC.  If this is the case, it will trigger a positive urine drug screen (UDS) test for cannabinoids (Marijuana).  Because a positive UDS for illicit substances is a violation of our medication agreement, your opioid analgesics (pain medicine) may be permanently discontinued. (Last update:  08/30/2017) ____________________________________________________________________________________________   ____________________________________________________________________________________________  Drug Holidays (Slow)  What is a "Drug Holiday"? Drug Holiday: is the name given to the period of time during which a patient stops taking a medication(s) for the purpose of eliminating tolerance to the drug.  Benefits . Improved effectiveness of opioids. . Decreased opioid dose needed to achieve benefits. . Improved pain with lesser dose.  What is tolerance? Tolerance: is the progressive decreased in effectiveness of a drug due to its repetitive use. With repetitive use, the body gets use to the medication and as a consequence, it loses its effectiveness. This is a common problem seen with opioid pain medications. As a result, a larger dose of the drug is needed to achieve the same effect that used to be obtained with a smaller dose.  How long should a "Drug Holiday" last? You should stay off of the pain medicine for at least 14 consecutive days. (2 weeks)  Should I stop the medicine "cold [place]"? No. You should always coordinate with your Pain Specialist so that he/she can provide you with the correct medication dose to make the transition as smoothly as possible.  How do I stop the medicine? Slowly. You will be instructed to decrease the daily amount of pills that you take by one (1) pill every seven (7) days. This is called a "slow downward taper" of your dose. For example: if you normally take four (4) pills per day, you will be asked to drop this dose to three (3) pills per day for seven (7) days, then to two (2) pills per day for seven (7) days, then to one (1) per day for seven (7) days, and at the end of those last seven (7) days, this is when the "Drug Holiday" would start.   Will I have withdrawals? By doing a "slow downward taper"  like this one, it is unlikely that you will  experience any significant withdrawal symptoms. Typically, what triggers withdrawals is the sudden stop of a high dose opioid therapy. Withdrawals can usually be avoided by slowly decreasing the dose over a prolonged period of time.  What are withdrawals? Withdrawals: refers to the wide range of symptoms that occur after stopping or dramatically reducing opiate drugs after heavy and prolonged use. Withdrawal symptoms do not occur to patients that use low dose opioids, or those who take the medication sporadically. Contrary to benzodiazepine (example: Valium, Xanax, etc.) or alcohol withdrawals ("Delirium Tremens"), opioid withdrawals are not lethal. Withdrawals are the physical manifestation of the body getting rid of the excess receptors.  Expected Symptoms Early symptoms of withdrawal may include: . Agitation . Anxiety . Muscle aches . Increased tearing . Insomnia . Runny nose . Sweating . Yawning  Late symptoms of withdrawal may include: . Abdominal cramping . Diarrhea . Dilated pupils . Goose bumps . Nausea . Vomiting  Will I experience withdrawals? Due to the slow nature of the taper, it is very unlikely that you will experience any.  What is a slow taper? Taper: refers to the gradual decrease in dose.  ___________________________________________________________________________________________

## 2019-10-08 DIAGNOSIS — R200 Anesthesia of skin: Secondary | ICD-10-CM

## 2019-10-08 HISTORY — DX: Anesthesia of skin: R20.0

## 2019-10-20 ENCOUNTER — Telehealth: Payer: BC Managed Care – PPO | Admitting: Pain Medicine

## 2019-10-21 ENCOUNTER — Ambulatory Visit: Payer: BC Managed Care – PPO | Admitting: Pain Medicine

## 2019-10-28 ENCOUNTER — Other Ambulatory Visit: Payer: Self-pay

## 2019-10-28 ENCOUNTER — Ambulatory Visit
Admission: RE | Admit: 2019-10-28 | Discharge: 2019-10-28 | Disposition: A | Discharge status: Home / Self Care | Payer: Self-pay | Source: Ambulatory Visit | Attending: Pain Medicine | Admitting: Pain Medicine

## 2019-10-28 ENCOUNTER — Encounter: Payer: Self-pay | Admitting: Pain Medicine

## 2019-10-28 ENCOUNTER — Ambulatory Visit (HOSPITAL_BASED_OUTPATIENT_CLINIC_OR_DEPARTMENT_OTHER): Payer: Self-pay | Admitting: Pain Medicine

## 2019-10-28 VITALS — BP 98/62 | HR 50 | Temp 98.0°F | Resp 16 | Ht 63.0 in | Wt 135.0 lb

## 2019-10-28 DIAGNOSIS — M545 Low back pain, unspecified: Secondary | ICD-10-CM

## 2019-10-28 DIAGNOSIS — M47817 Spondylosis without myelopathy or radiculopathy, lumbosacral region: Secondary | ICD-10-CM | POA: Insufficient documentation

## 2019-10-28 DIAGNOSIS — M47816 Spondylosis without myelopathy or radiculopathy, lumbar region: Secondary | ICD-10-CM | POA: Insufficient documentation

## 2019-10-28 DIAGNOSIS — G8929 Other chronic pain: Secondary | ICD-10-CM | POA: Insufficient documentation

## 2019-10-28 DIAGNOSIS — M5137 Other intervertebral disc degeneration, lumbosacral region: Secondary | ICD-10-CM

## 2019-10-28 MED ORDER — ROPIVACAINE HCL 2 MG/ML IJ SOLN
18.0000 mL | Freq: Once | INTRAMUSCULAR | Status: AC
  Administered 2019-10-28: 18 mL via PERINEURAL
  Filled 2019-10-28: qty 20

## 2019-10-28 MED ORDER — MIDAZOLAM HCL 5 MG/5ML IJ SOLN
1.0000 mg | INTRAMUSCULAR | Status: DC | PRN
  Administered 2019-10-28: 1 mg via INTRAVENOUS
  Filled 2019-10-28: qty 5

## 2019-10-28 MED ORDER — LIDOCAINE HCL 2 % IJ SOLN
20.0000 mL | Freq: Once | INTRAMUSCULAR | Status: AC
  Administered 2019-10-28: 400 mg
  Filled 2019-10-28: qty 40

## 2019-10-28 MED ORDER — FENTANYL CITRATE (PF) 100 MCG/2ML IJ SOLN
25.0000 ug | INTRAMUSCULAR | Status: DC | PRN
  Administered 2019-10-28: 50 ug via INTRAVENOUS
  Filled 2019-10-28: qty 2

## 2019-10-28 MED ORDER — TRIAMCINOLONE ACETONIDE 40 MG/ML IJ SUSP
80.0000 mg | Freq: Once | INTRAMUSCULAR | Status: AC
  Administered 2019-10-28: 80 mg
  Filled 2019-10-28: qty 2

## 2019-10-28 MED ORDER — LACTATED RINGERS IV SOLN
1000.0000 mL | Freq: Once | INTRAVENOUS | Status: AC
  Administered 2019-10-28: 1000 mL via INTRAVENOUS

## 2019-10-28 NOTE — Patient Instructions (Signed)

## 2019-10-28 NOTE — Progress Notes (Signed)
PROVIDER NOTE: Information contained herein reflects review and annotations entered in association with encounter. Interpretation of such information and data should be left to medically-trained personnel. Information provided to patient can be located elsewhere in the medical record under "Patient Instructions". Document created using STT-dictation technology, any transcriptional errors that may result from process are unintentional.    Patient: Natasha Hanson  Service Category: Procedure  Provider: Oswaldo Done, MD  DOB: 06/21/1965  DOS: 10/28/2019  Location: [facility]  MRN: [id number]  Setting: Ambulatory - outpatient  Referring Provider: No ref. provider found  Type: Established Patient  Specialty: Interventional Pain Management  PCP: System, Pcp Not In   Primary Reason for Visit: Interventional Pain Management Treatment. CC: Back Pain  Procedure:          Anesthesia, Analgesia, Anxiolysis:  Type: Lumbar Facet, Medial Branch Block(s) #1  Primary Purpose: Diagnostic Region: Posterolateral Lumbosacral Spine Level: L2, L3, L4, L5, & S1 Medial Branch Level(s). Injecting these levels blocks the L3-4, L4-5, and L5-S1 lumbar facet joints. Laterality: Bilateral  Type: Moderate (Conscious) Sedation combined with Local Anesthesia Indication(s): Analgesia and Anxiety Route: Intravenous (IV) IV Access: Secured Sedation: Meaningful verbal contact was maintained at all times during the procedure  Local Anesthetic: Lidocaine 1-2%  Position: Prone   Indications: 1. Lumbar facet syndrome (Bilateral)   2. Spondylosis without myelopathy or radiculopathy, lumbosacral region   3. Lumbar facet hypertrophy (Multilevel) (Bilateral)   4. DDD (degenerative disc disease), lumbosacral   5. Chronic low back pain (1ry area of Pain) (Bilateral) (Midline) (L>R) w/o sciatica    Pain Score: Pre-procedure: 8 /10 Post-procedure: 0-No pain/10   Pre-op Assessment:  [name] is a 54 y.o.  (year old), female patient, seen today for interventional treatment. She  has a past surgical history that includes Refractive surgery; Abdominal hysterectomy; Foot surgery; and Foot Fusion. [name] has a current medication list which includes the following prescription(s): albuterol, gabapentin, magnesium oxide, meloxicam, methocarbamol, oxycodone, propranolol, turmeric, and gabapentin, and the following Facility-Administered Medications: fentanyl and midazolam. Her primarily concern today is the Back Pain  Initial Vital Signs:  Pulse/HCG Rate: (!) 50ECG Heart Rate: (!) 52 Temp: (!) 97.3 F (36.3 C) Resp: 18 BP: 104/61 SpO2: 100 %  BMI: Estimated body mass index is 23.91 kg/m as calculated from the following:   Height as of this encounter: 5\' 3"  (1.6 m).   Weight as of this encounter: 135 lb (61.2 kg).  Risk Assessment: Allergies: Reviewed. She has No Known Allergies.  Allergy Precautions: None required Coagulopathies: Reviewed. None identified.  Blood-thinner therapy: None at this time Active Infection(s): Reviewed. None identified. [name] is afebrile  Site Confirmation: [name] was asked to confirm the procedure and laterality before marking the site Procedure checklist: Completed Consent: Before the procedure and under the influence of no sedative(s), amnesic(s), or anxiolytics, the patient was informed of the treatment options, risks and possible complications. To fulfill our ethical and legal obligations, as recommended by the American Medical Association's Code of Ethics, I have informed the patient of my clinical impression; the nature and purpose of the treatment or procedure; the risks, benefits, and possible complications of the intervention; the alternatives, including doing nothing; the risk(s) and benefit(s) of the alternative treatment(s) or procedure(s); and the risk(s) and benefit(s) of doing nothing. The patient was provided information about the general risks and  possible complications associated with the procedure. These may include, but are not limited to: failure to achieve desired goals,  infection, bleeding, organ or nerve damage, allergic reactions, paralysis, and death. In addition, the patient was informed of those risks and complications associated to Spine-related procedures, such as failure to decrease pain; infection (i.e.: Meningitis, epidural or intraspinal abscess); bleeding (i.e.: epidural hematoma, subarachnoid hemorrhage, or any other type of intraspinal or peri-dural bleeding); organ or nerve damage (i.e.: Any type of peripheral nerve, nerve root, or spinal cord injury) with subsequent damage to sensory, motor, and/or autonomic systems, resulting in permanent pain, numbness, and/or weakness of one or several areas of the body; allergic reactions; (i.e.: anaphylactic reaction); and/or death. Furthermore, the patient was informed of those risks and complications associated with the medications. These include, but are not limited to: allergic reactions (i.e.: anaphylactic or anaphylactoid reaction(s)); adrenal axis suppression; blood sugar elevation that in diabetics may result in ketoacidosis or comma; water retention that in patients with history of congestive heart failure may result in shortness of breath, pulmonary edema, and decompensation with resultant heart failure; weight gain; swelling or edema; medication-induced neural toxicity; particulate matter embolism and blood vessel occlusion with resultant organ, and/or nervous system infarction; and/or aseptic necrosis of one or more joints. Finally, the patient was informed that Medicine is not an exact science; therefore, there is also the possibility of unforeseen or unpredictable risks and/or possible complications that may result in a catastrophic outcome. The patient indicated having understood very clearly. We have given the patient no guarantees and we have made no promises. Enough time was  given to the patient to ask questions, all of which were answered to the patient's satisfaction. Ms. Killam has indicated that she wanted to continue with the procedure. Attestation: I, the ordering provider, attest that I have discussed with the patient the benefits, risks, side-effects, alternatives, likelihood of achieving goals, and potential problems during recovery for the procedure that I have provided informed consent. Date  Time: 10/28/2019 11:19 AM  Pre-Procedure Preparation:  Monitoring: As per clinic protocol. Respiration, ETCO2, SpO2, BP, heart rate and rhythm monitor placed and checked for adequate function Safety Precautions: Patient was assessed for positional comfort and pressure points before starting the procedure. Time-out: I initiated and conducted the "Time-out" before starting the procedure, as per protocol. The patient was asked to participate by confirming the accuracy of the "Time Out" information. Verification of the correct person, site, and procedure were performed and confirmed by me, the nursing staff, and the patient. "Time-out" conducted as per Joint Commission's Universal Protocol (UP.01.01.01). Time: 1157  Description of Procedure:          Laterality: Bilateral. The procedure was performed in identical fashion on both sides. Levels:  L2, L3, L4, L5, & S1 Medial Branch Level(s) Area Prepped: Posterior Lumbosacral Region DuraPrep (Iodine Povacrylex [0.7% available iodine] and Isopropyl Alcohol, 74% w/w) Safety Precautions: Aspiration looking for blood return was conducted prior to all injections. At no point did we inject any substances, as a needle was being advanced. Before injecting, the patient was told to immediately notify me if she was experiencing any new onset of "ringing in the ears, or metallic taste in the mouth". No attempts were made at seeking any paresthesias. Safe injection practices and needle disposal techniques used. Medications properly checked for  expiration dates. SDV (single dose vial) medications used. After the completion of the procedure, all disposable equipment used was discarded in the proper designated medical waste containers. Local Anesthesia: Protocol guidelines were followed. The patient was positioned over the fluoroscopy table. The area was prepped in the  usual manner. The time-out was completed. The target area was identified using fluoroscopy. A 12-in long, straight, sterile hemostat was used with fluoroscopic guidance to locate the targets for each level blocked. Once located, the skin was marked with an approved surgical skin marker. Once all sites were marked, the skin (epidermis, dermis, and hypodermis), as well as deeper tissues (fat, connective tissue and muscle) were infiltrated with a small amount of a short-acting local anesthetic, loaded on a 10cc syringe with a 25G, 1.5-in  Needle. An appropriate amount of time was allowed for local anesthetics to take effect before proceeding to the next step. Local Anesthetic: Lidocaine 2.0% The unused portion of the local anesthetic was discarded in the proper designated containers. Technical explanation of process:  L2 Medial Branch Nerve Block (MBB): The target area for the L2 medial branch is at the junction of the postero-lateral aspect of the superior articular process and the superior, posterior, and medial edge of the transverse process of L3. Under fluoroscopic guidance, a Quincke needle was inserted until contact was made with os over the superior postero-lateral aspect of the pedicular shadow (target area). After negative aspiration for blood, 0.5 mL of the nerve block solution was injected without difficulty or complication. The needle was removed intact. L3 Medial Branch Nerve Block (MBB): The target area for the L3 medial branch is at the junction of the postero-lateral aspect of the superior articular process and the superior, posterior, and medial edge of the transverse  process of L4. Under fluoroscopic guidance, a Quincke needle was inserted until contact was made with os over the superior postero-lateral aspect of the pedicular shadow (target area). After negative aspiration for blood, 0.5 mL of the nerve block solution was injected without difficulty or complication. The needle was removed intact. L4 Medial Branch Nerve Block (MBB): The target area for the L4 medial branch is at the junction of the postero-lateral aspect of the superior articular process and the superior, posterior, and medial edge of the transverse process of L5. Under fluoroscopic guidance, a Quincke needle was inserted until contact was made with os over the superior postero-lateral aspect of the pedicular shadow (target area). After negative aspiration for blood, 0.5 mL of the nerve block solution was injected without difficulty or complication. The needle was removed intact. L5 Medial Branch Nerve Block (MBB): The target area for the L5 medial branch is at the junction of the postero-lateral aspect of the superior articular process and the superior, posterior, and medial edge of the sacral ala. Under fluoroscopic guidance, a Quincke needle was inserted until contact was made with os over the superior postero-lateral aspect of the pedicular shadow (target area). After negative aspiration for blood, 0.5 mL of the nerve block solution was injected without difficulty or complication. The needle was removed intact. S1 Medial Branch Nerve Block (MBB): The target area for the S1 medial branch is at the posterior and inferior 6 o'clock position of the L5-S1 facet joint. Under fluoroscopic guidance, the Quincke needle inserted for the L5 MBB was redirected until contact was made with os over the inferior and postero aspect of the sacrum, at the 6 o' clock position under the L5-S1 facet joint (Target area). After negative aspiration for blood, 0.5 mL of the nerve block solution was injected without difficulty or  complication. The needle was removed intact.  Nerve block solution: 0.2% PF-Ropivacaine + Triamcinolone (40 mg/mL) diluted to a final concentration of 4 mg of Triamcinolone/mL of Ropivacaine The unused portion of the  solution was discarded in the proper designated containers. Procedural Needles: 22-gauge, 3.5-inch, Quincke needles used for all levels.  Once the entire procedure was completed, the treated area was cleaned, making sure to leave some of the prepping solution back to take advantage of its long term bactericidal properties.   Illustration of the posterior view of the lumbar spine and the posterior neural structures. Laminae of L2 through S1 are labeled. DPRL5, dorsal primary ramus of L5; DPRS1, dorsal primary ramus of S1; DPR3, dorsal primary ramus of L3; FJ, facet (zygapophyseal) joint L3-L4; I, inferior articular process of L4; LB1, lateral branch of dorsal primary ramus of L1; IAB, inferior articular branches from L3 medial branch (supplies L4-L5 facet joint); IBP, intermediate branch plexus; MB3, medial branch of dorsal primary ramus of L3; NR3, third lumbar nerve root; S, superior articular process of L5; SAB, superior articular branches from L4 (supplies L4-5 facet joint also); TP3, transverse process of L3.  Vitals:   10/28/19 1207 10/28/19 1215 10/28/19 1225 10/28/19 1236  BP: 110/67 112/70 (!) 105/59 98/62  Pulse:      Resp: 17 17 14 16   Temp:  98 F (36.7 C)    SpO2: 97% 100% 100% 100%  Weight:      Height:         Start Time: 1157 hrs. End Time: 1207 hrs.  Imaging Guidance (Spinal):          Type of Imaging Technique: Fluoroscopy Guidance (Spinal) Indication(s): Assistance in needle guidance and placement for procedures requiring needle placement in or near specific anatomical locations not easily accessible without such assistance. Exposure Time: Please see nurses notes. Contrast: None used. Fluoroscopic Guidance: I was personally present during the use of  fluoroscopy. "Tunnel Vision Technique" used to obtain the best possible view of the target area. Parallax error corrected before commencing the procedure. "Direction-depth-direction" technique used to introduce the needle under continuous pulsed fluoroscopy. Once target was reached, antero-posterior, oblique, and lateral fluoroscopic projection used confirm needle placement in all planes. Images permanently stored in EMR. Interpretation: No contrast injected. I personally interpreted the imaging intraoperatively. Adequate needle placement confirmed in multiple planes. Permanent images saved into the patient's record.  Antibiotic Prophylaxis:   Anti-infectives (From admission, onward)   None     Indication(s): None identified  Post-operative Assessment:  Post-procedure Vital Signs:  Pulse/HCG Rate: (!) 50(!) 48 Temp: 98 F (36.7 C) Resp: 16 BP: 98/62 SpO2: 100 %  EBL: None  Complications: No immediate post-treatment complications observed by team, or reported by patient.  Note: The patient tolerated the entire procedure well. A repeat set of vitals were taken after the procedure and the patient was kept under observation following institutional policy, for this type of procedure. Post-procedural neurological assessment was performed, showing return to baseline, prior to discharge. The patient was provided with post-procedure discharge instructions, including a section on how to identify potential problems. Should any problems arise concerning this procedure, the patient was given instructions to immediately contact us, at any time, without hesitation. In any case, we plan to contact the patient by telephone for a follow-up status report regarding this interventional procedure.  Comments:  No additional relevant information.  Plan of Care  Orders:  Orders Placed This Encounter  Procedures  . LUMBAR FACET(MEDIAL BRANCH NERVE BLOCK) MBNB    Scheduling Instructions:     Procedure: Lumbar  facet block (AKA.: Lumbosacral medial branch nerve block)     Side: Bilateral     Level: L3-4, L4-5, &  L5-S1 Facets (L2, L3, L4, L5, & S1 Medial Branch Nerves)     Sedation: Patient's choice.     Timeframe: Today    Order Specific Question:   Where will this procedure be performed?    Answer:   [facility]  . [facility] C-ARM 1-60 MIN NO REPORT    Intraoperative interpretation by procedural physician at [facility].    Standing Status:   Standing    Number of Occurrences:   1    Order Specific Question:   Reason for exam:    Answer:   Assistance in needle guidance and placement for procedures requiring needle placement in or near specific anatomical locations not easily accessible without such assistance.  . Informed Consent Details: Physician/Practitioner Attestation; Transcribe to consent form and obtain patient signature    Nursing Order: Transcribe to consent form and obtain patient signature. Note: Always confirm laterality of pain with Ms. Baze, before procedure. Procedure: Lumbar Facet Block  under fluoroscopic guidance Indication/Reason: Low Back Pain, with our without leg pain, due to Facet Joint Arthralgia (Joint Pain) known as Lumbar Facet Syndrome, secondary to Lumbar, and/or Lumbosacral Spondylosis (Arthritis of the Spine), without myelopathy or radiculopathy (Nerve Damage). Provider Attestation: I, Shaniqua Guillot A. Laban Emperor, MD, (Pain Management Specialist), the physician/practitioner, attest that I have discussed with the patient the benefits, risks, side effects, alternatives, likelihood of achieving goals and potential problems during recovery for the procedure that I have provided informed consent.  . Provide equipment / supplies at bedside    Equipment required: Single use, disposable, "Block Tray"    Standing Status:   Standing    Number of Occurrences:   1    Order Specific Question:   Specify    Answer:   Block Tray   Chronic Opioid Analgesic:   Oxycodone IR 5 mg every 8 hours (15 mg/day of oxycodone) Highest recorded MME/day: 30 mg/day MME/day: 22.5 mg/day   Medications ordered for procedure: Meds ordered this encounter  Medications  . lidocaine (XYLOCAINE) 2 % (with pres) injection 400 mg  . lactated ringers infusion 1,000 mL  . midazolam (VERSED) 5 MG/5ML injection 1-2 mg    Make sure Flumazenil is available in the pyxis when using this medication. If oversedation occurs, administer 0.2 mg IV over 15 sec. If after 45 sec no response, administer 0.2 mg again over 1 min; may repeat at 1 min intervals; not to exceed 4 doses (1 mg)  . fentaNYL (SUBLIMAZE) injection 25-50 mcg    Make sure Narcan is available in the pyxis when using this medication. In the event of respiratory depression (RR< 8/min): Titrate NARCAN (naloxone) in increments of 0.1 to 0.2 mg IV at 2-3 minute intervals, until desired degree of reversal.  . ropivacaine (PF) 2 mg/mL (0.2%) (NAROPIN) injection 18 mL  . triamcinolone acetonide (KENALOG-40) injection 80 mg   Medications administered: We administered lidocaine, lactated ringers, midazolam, fentaNYL, ropivacaine (PF) 2 mg/mL (0.2%), and triamcinolone acetonide.  See the medical record for exact dosing, route, and time of administration.  Follow-up plan:   Return in about 2 weeks (around 11/11/2019) for (VV), (PP).       Interventional management options: Planned, scheduled, and/or pending:    Diagnostic bilateral lumbar facet block #1    Considering:   Diagnostic left lumbar ESI  Diagnostic bilateral lumbar facet block  Possible bilateral lumbar facet RFA  Diagnostic bilateral intra-articular knee joint injection  Possible bilateral Hyalgan knee injections  Diagnostic bilateral genicular nerve  block  Possible bilateral genicular nerve RFA  Diagnostic left cervical ESI  Diagnostic bilateral cervical facet block  Possible bilateral cervical facet RFA    PRN Procedures:   None at this time       Recent Visits Date Type Provider Dept  10/07/19 Telemedicine Delano MetzNaveira, Katlyne Nishida, MD [facility]  09/17/19 Telemedicine Delano MetzNaveira, Wm Fruchter, MD [facility]  09/15/19 Telemedicine Delano MetzNaveira, Saabir Blyth, MD [facility]  09/08/19 Office Visit Delano MetzNaveira, Jezel Basto, MD [facility]  Showing recent visits within past 90 days and meeting all other requirements   Today's Visits Date Type Provider Dept  10/28/19 Procedure visit Delano MetzNaveira, Adajah Cocking, MD [facility]  Showing today's visits and meeting all other requirements   Future Appointments Date Type Provider Dept  11/06/19 Appointment Delano MetzNaveira, Maurice Ramseur, MD [facility]  Showing future appointments within next 90 days and meeting all other requirements   Disposition: Discharge home  Discharge (Date  Time): 10/28/2019; 1237 hrs.   Primary Care Physician: System, Pcp Not In [facility] Note by: Oswaldo DoneFrancisco A Ren Aspinall, MD Date: 10/28/2019; Time: 12:43 PM  Disclaimer:  Medicine is not an Visual merchandiserexact science. The only guarantee in medicine is that nothing is guaranteed. It is important to note that the decision to proceed with this intervention was based on the information collected from the patient. The Data and conclusions were drawn from the patient's questionnaire, the interview, and the physical examination. Because the information was provided in large part by the patient, it cannot be guaranteed that it has not been purposely or unconsciously manipulated. Every effort has been made to obtain as much relevant data as possible for this evaluation. It is important to note that the conclusions that lead to this procedure are derived in large part from the available data. Always take into account that the treatment will also be dependent on availability of resources and existing treatment guidelines, considered by other Pain Management Practitioners as being common  knowledge and practice, at the time of the intervention. For Medico-Legal purposes, it is also important to point out that variation in procedural techniques and pharmacological choices are the acceptable norm. The indications, contraindications, technique, and results of the above procedure should only be interpreted and judged by a Board-Certified Interventional Pain Specialist with extensive familiarity and expertise in the same exact procedure and technique.

## 2019-10-29 ENCOUNTER — Telehealth: Payer: Self-pay

## 2019-10-29 NOTE — Telephone Encounter (Signed)
Post procedure phone call.  LM 

## 2019-11-06 ENCOUNTER — Other Ambulatory Visit: Payer: Self-pay

## 2019-11-06 ENCOUNTER — Encounter: Payer: Self-pay | Admitting: Pain Medicine

## 2019-11-06 ENCOUNTER — Ambulatory Visit: Payer: Self-pay | Attending: Pain Medicine | Admitting: Pain Medicine

## 2019-11-06 ENCOUNTER — Other Ambulatory Visit: Payer: Self-pay | Admitting: Pain Medicine

## 2019-11-06 VITALS — BP 135/69 | HR 69 | Temp 97.6°F | Ht 63.0 in | Wt 135.0 lb

## 2019-11-06 DIAGNOSIS — M545 Low back pain: Secondary | ICD-10-CM

## 2019-11-06 DIAGNOSIS — M25562 Pain in left knee: Secondary | ICD-10-CM

## 2019-11-06 DIAGNOSIS — M47816 Spondylosis without myelopathy or radiculopathy, lumbar region: Secondary | ICD-10-CM

## 2019-11-06 DIAGNOSIS — M792 Neuralgia and neuritis, unspecified: Secondary | ICD-10-CM

## 2019-11-06 DIAGNOSIS — G8929 Other chronic pain: Secondary | ICD-10-CM

## 2019-11-06 DIAGNOSIS — M25561 Pain in right knee: Secondary | ICD-10-CM

## 2019-11-06 DIAGNOSIS — M170 Bilateral primary osteoarthritis of knee: Secondary | ICD-10-CM

## 2019-11-06 DIAGNOSIS — M7918 Myalgia, other site: Secondary | ICD-10-CM

## 2019-11-06 DIAGNOSIS — Z79891 Long term (current) use of opiate analgesic: Secondary | ICD-10-CM

## 2019-11-06 DIAGNOSIS — M542 Cervicalgia: Secondary | ICD-10-CM

## 2019-11-06 DIAGNOSIS — Z79899 Other long term (current) drug therapy: Secondary | ICD-10-CM

## 2019-11-06 DIAGNOSIS — G894 Chronic pain syndrome: Secondary | ICD-10-CM

## 2019-11-06 MED ORDER — MAGNESIUM OXIDE -MG SUPPLEMENT 500 MG PO CAPS: 1.0000 | ORAL_CAPSULE | Freq: Two times a day (BID) | ORAL | 3 refills | Status: DC

## 2019-11-06 MED ORDER — TURMERIC 500 MG PO CAPS: 500.0000 mg | ORAL_CAPSULE | Freq: Every day | ORAL | 3 refills | Status: DC

## 2019-11-06 MED ORDER — OXYCODONE HCL 5 MG PO TABS: 5.0000 mg | ORAL_TABLET | Freq: Three times a day (TID) | ORAL | 0 refills | Status: DC | PRN

## 2019-11-06 MED ORDER — GABAPENTIN 600 MG PO TABS: 600.0000 mg | ORAL_TABLET | Freq: Four times a day (QID) | ORAL | 5 refills | Status: DC

## 2019-11-06 MED ORDER — MELOXICAM 15 MG PO TABS: 15.0000 mg | ORAL_TABLET | Freq: Every day | ORAL | 1 refills | Status: DC

## 2019-11-06 MED ORDER — METHOCARBAMOL 750 MG PO TABS: 750.0000 mg | ORAL_TABLET | Freq: Three times a day (TID) | ORAL | 5 refills | Status: DC

## 2019-11-06 NOTE — Progress Notes (Signed)
PROVIDER NOTE: Information contained herein reflects review and annotations entered in association with encounter. Interpretation of such information and data should be left to medically-trained personnel. Information provided to patient can be located elsewhere in the medical record under "Patient Instructions". Document created using STT-dictation technology, any transcriptional errors that may result from process are unintentional.    Patient: Natasha Hanson  Service Category: E/M  Provider: Gaspar Cola, MD  DOB: 1966/05/09  DOS: 11/06/2019  Referring Provider: No ref. provider found  MRN: [id number]  Setting: Ambulatory outpatient  PCP: System, Pcp Not In  Type: Established Patient  Specialty: Interventional Pain Management    Location: Office  Delivery: Face-to-face     Primary Reason(s) for Visit: Encounter for prescription drug management. (Level of risk: moderate)  CC: Back Pain  HPI  Ms. Pla is a 54 y.o. year old, female patient, who comes today for a medication management evaluation. She has Tremor; Chronic pain syndrome; Pharmacologic therapy; Disorder of skeletal system; Problems influencing health status; Cervicalgia; DDD (degenerative disc disease), cervical; Cervical facet hypertrophy; Cervical facet syndrome; Cervical central spinal stenosis; Abnormal MRI, cervical spine (02/20/2019); Cervical foraminal stenosis; Chronic low back pain (1ry area of Pain) (Bilateral) (Midline) (L>R) w/o sciatica; Chronic knee pain (2ry area of Pain) (Bilateral) (L>R); Chronic neck pain (3ry area of Pain) (Bilateral) (L>R); DDD (degenerative disc disease), lumbosacral; Long term prescription opiate use; Lumbar facet hypertrophy (Multilevel) (Bilateral); Lumbar facet syndrome (Bilateral); Osteoarthritis of knee (Left); Osteoarthritis of knee (Right); Osteoarthritis of the knees (Bilateral); Chronic sacroiliac joint pain (Bilateral); Somatic dysfunction of sacroiliac joints (Bilateral); Other  spondylosis, sacral and sacrococcygeal region; Spondylosis without myelopathy or radiculopathy, lumbosacral region; Chronic hip pain (Bilateral); Neurogenic pain; Chronic musculoskeletal pain; and Arm numbness on their problem list. Her primarily concern today is the Back Pain  Pain Assessment: Location: Lower Back Radiating: Denies Onset: More than a month ago Duration: Chronic pain Quality: Aching, Burning Severity: 3 /10 (subjective, self-reported pain score)  Note: Reported level is compatible with observation.                         When using our objective Pain Scale, levels between 6 and 10/10 are said to belong in an emergency room, as it progressively worsens from a 6/10, described as severely limiting, requiring emergency care not usually available at an outpatient pain management facility. At a 6/10 level, communication becomes difficult and requires great effort. Assistance to reach the emergency department may be required. Facial flushing and profuse sweating along with potentially dangerous increases in heart rate and blood pressure will be evident. Effect on ADL: limits my daily activities Timing: Constant Modifying factors: Meds and laying down at times BP: 135/69  HR: 69  Ms. Bensen was last scheduled for an appointment on 10/28/2019 for medication management. During today's appointment we reviewed Ms. Hilbert's chronic pain status, as well as her outpatient medication regimen.  Today the computer system went down and this required that we go over all of the data again, after the patient had left.  Because we did not have certain data available when she was being interviewed, we will need to get in review some of the results that she indicated she had.  For example the results of her diagnostic lumbar facet block did not quite make a whole lot of sense.  This is a topic that we informed the patient we would have to go over it again once we had the rest  of the date available.  The  patient  has no history on file for drug. Her body mass index is 23.91 kg/m.  Further details on both, my assessment(s), as well as the proposed treatment plan, please see below.  Post-Procedure Evaluation  Procedure: Diagnostic bilateral lumbar facet block #1 under fluoroscopic guidance and IV sedation Pre-procedure pain level: 8/10 Post-procedure: 0/10 (100% relief)  Sedation: Sedation provided.  Effectiveness during initial hour after procedure(Ultra-Short Term Relief): 100 % .  Local anesthetic used: Long-acting (4-6 hours) Effectiveness: Defined as any analgesic benefit obtained secondary to the administration of local anesthetics. This carries significant diagnostic value as to the etiological location, or anatomical origin, of the pain. Duration of benefit is expected to coincide with the duration of the local anesthetic used.  Effectiveness during initial 4-6 hours after procedure(Short-Term Relief): 0 % .  Long-term benefit: Defined as any relief past the pharmacologic duration of the local anesthetics.  Effectiveness past the initial 6 hours after procedure(Long-Term Relief): 0 % .  Current benefits: Defined as benefit that persist at this time.   Analgesia:  Back to baseline Function: Back to baseline ROM: Back to baseline  Controlled Substance Pharmacotherapy Assessment REMS (Risk Evaluation and Mitigation Strategy)  Analgesic: Oxycodone IR 5 mg every 8 hours (15 mg/day of oxycodone) Highest recorded MME/day: 30 mg/day MME/day: 22.5 mg/day   No notes on file Pharmacokinetics: Liberation and absorption (onset of action): WNL Distribution (time to peak effect): WNL Metabolism and excretion (duration of action): WNL         Pharmacodynamics: Desired effects: Analgesia: Ms. Tartaglia reports >50% benefit. Functional ability: Patient reports that medication allows her to accomplish basic ADLs Clinically meaningful improvement in function (CMIF): Sustained CMIF goals  met Perceived effectiveness: Described as relatively effective, allowing for increase in activities of daily living (ADL) Undesirable effects: Side-effects or Adverse reactions: None reported Monitoring: [place] PMP: PDMP reviewed during this encounter. Online review of the past 7-monthperiod conducted. Compliant with practice rules and regulations Last UDS on record: Summary  Date Value Ref Range Status  09/09/2019 Note  Final    Comment:    ==================================================================== Compliance Drug Analysis, Ur ==================================================================== Test                             Result       Flag       Units Drug Present   Gabapentin                     PRESENT   Methocarbamol                  PRESENT   Naproxen                       PRESENT   Propranolol                    PRESENT ==================================================================== Test                      Result    Flag   Units      Ref Range   Creatinine              104              mg/dL      >=20 ==================================================================== Declared Medications:  Medication list was not provided. ==================================================================== For clinical consultation,  please call [phone]. ====================================================================    UDS interpretation: Compliant          Medication Assessment Form: Reviewed. Patient indicates being compliant with therapy Treatment compliance: Compliant Risk Assessment Profile: Aberrant behavior: See initial evaluations. None observed or detected today Comorbid factors increasing risk of overdose: See initial evaluation. No additional risks detected today Opioid risk tool (ORT):  Opioid Risk  09/08/2019  Alcohol 0  Illegal Drugs 0  Rx Drugs 0  Alcohol 0  Illegal Drugs 0  Rx Drugs 0  Age between 16-45 years  0  History of  Preadolescent Sexual Abuse 0  Psychological Disease 0  Depression 0  Opioid Risk Tool Scoring 0  Opioid Risk Interpretation Low Risk    ORT Scoring interpretation table:  Score <3 = Low Risk for SUD  Score between 4-7 = Moderate Risk for SUD  Score >8 = High Risk for Opioid Abuse   Risk of substance use disorder (SUD): Low  Risk Mitigation Strategies:  Patient Counseling: Covered Patient-Prescriber Agreement (PPA): Present and active  Notification to other healthcare providers: Done  Pharmacologic Plan: No change in therapy, at this time.             Laboratory Chemistry Profile   Renal Lab Results  Component Value Date   BUN 9 09/09/2019   CREATININE 0.73 09/09/2019   BCR 12 09/09/2019   GFRAA 109 09/09/2019   GFRNONAA 94 09/09/2019   PROTEINUR 30 (A) 01/05/2018     Electrolytes Lab Results  Component Value Date   NA 139 09/09/2019   K 4.7 09/09/2019   CL 101 09/09/2019   CALCIUM 9.5 09/09/2019   MG 1.8 09/09/2019     Hepatic Lab Results  Component Value Date   AST 16 09/09/2019   ALT 14 02/15/2019   ALBUMIN 4.3 09/09/2019   ALKPHOS 70 09/09/2019   LIPASE 21 01/05/2018     ID No results found for: LYMEIGGIGMAB, HIV, SARSCOV2NAA, STAPHAUREUS, MRSAPCR, HCVAB, PREGTESTUR, RMSFIGG, QFVRPH1IGG, QFVRPH2IGG, [facility]   Bone Lab Results  Component Value Date   25OHVITD1 33 09/09/2019   25OHVITD2 <1.0 09/09/2019   25OHVITD3 33 09/09/2019     Endocrine Lab Results  Component Value Date   GLUCOSE 125 (H) 09/09/2019   GLUCOSEU NEGATIVE 01/05/2018   TSH 0.967 02/15/2019     Neuropathy Lab Results  Component Value Date   VITAMINB12 299 09/09/2019     CNS No results found for: COLORCSF, APPEARCSF, RBCCOUNTCSF, WBCCSF, POLYSCSF, LYMPHSCSF, EOSCSF, PROTEINCSF, GLUCCSF, JCVIRUS, CSFOLI, IGGCSF, LABACHR, ACETBL, LABACHR, ACETBL   Inflammation (CRP: Acute  ESR: Chronic) Lab Results  Component Value Date   CRP 2 09/09/2019   ESRSEDRATE 7 09/09/2019      Rheumatology No results found for: RF, ANA, LABURIC, URICUR, LYMEIGGIGMAB, LYMEABIGMQN, HLAB27   Coagulation Lab Results  Component Value Date   PLT 372 02/15/2019     Cardiovascular Lab Results  Component Value Date   TROPONINI <0.03 01/05/2018   HGB 15.5 (H) 02/15/2019   HCT 45.7 02/15/2019     Screening No results found for: SARSCOV2NAA, COVIDSOURCE, STAPHAUREUS, MRSAPCR, HCVAB, HIV, PREGTESTUR   Cancer No results found for: CEA, CA125, LABCA2   Allergens No results found for: ALMOND, APPLE, ASPARAGUS, AVOCADO, BANANA, BARLEY, BASIL, BAYLEAF, GREENBEAN, LIMABEAN, WHITEBEAN, BEEFIGE, REDBEET, BLUEBERRY, BROCCOLI, CABBAGE, MELON, CARROT, CASEIN, CASHEWNUT, CAULIFLOWER, CELERY     Note: Lab results reviewed.   Recent Diagnostic Imaging Results  DG PAIN CLINIC C-ARM 1-60 MIN NO REPORT Fluoro was used,  but no Radiologist interpretation will be provided.  Please refer to "NOTES" tab for provider progress note.  Complexity Note: Imaging results reviewed. Results shared with Ms. Russell, using Layman's terms.                              Meds   Current Outpatient Medications:  .  albuterol (VENTOLIN HFA) 108 (90 Base) MCG/ACT inhaler, 2-4 puffs Q4 PRN SOB or tight cough, Disp: , Rfl:  .  Magnesium Oxide 500 MG CAPS, Take 1 capsule (500 mg total) by mouth 2 (two) times daily at 8 am and 10 pm., Disp: 180 capsule, Rfl: 0 .  [START ON 01/05/2020] Magnesium Oxide 500 MG CAPS, Take 1 capsule (500 mg total) by mouth 2 (two) times daily at 8 am and 10 pm., Disp: 180 capsule, Rfl: 3 .  meloxicam (MOBIC) 15 MG tablet, Take 1 tablet (15 mg total) by mouth daily., Disp: 90 tablet, Rfl: 1 .  methocarbamol (ROBAXIN) 750 MG tablet, Take 1 tablet (750 mg total) by mouth 3 (three) times daily., Disp: 90 tablet, Rfl: 5 .  oxyCODONE (OXY IR/ROXICODONE) 5 MG immediate release tablet, Take 1 tablet (5 mg total) by mouth every 8 (eight) hours as needed for severe pain. Must last 30 days., Disp: 90  tablet, Rfl: 0 .  propranolol (INDERAL) 10 MG tablet, Take 20 mg by mouth 4 (four) times daily. , Disp: , Rfl:  .  Turmeric 500 MG CAPS, Take 500 mg by mouth daily., Disp: 90 capsule, Rfl: 0 .  [START ON 01/05/2020] Turmeric 500 MG CAPS, Take 500 mg by mouth daily., Disp: 90 capsule, Rfl: 3 .  gabapentin (NEURONTIN) 600 MG tablet, Take 1 tablet (600 mg total) by mouth in the morning, at noon, in the evening, and at bedtime., Disp: 120 tablet, Rfl: 5  ROS  Constitutional: Denies any fever or chills Gastrointestinal: No reported hemesis, hematochezia, vomiting, or acute GI distress Musculoskeletal: Denies any acute onset joint swelling, redness, loss of ROM, or weakness Neurological: No reported episodes of acute onset apraxia, aphasia, dysarthria, agnosia, amnesia, paralysis, loss of coordination, or loss of consciousness  Allergies  Ms. Fusilier has No Known Allergies.  PFSH  Drug: Ms. Kavan  has no history on file for drug. Alcohol:  reports no history of alcohol use. Tobacco:  reports that she has been smoking. She has never used smokeless tobacco. Medical:  has a past medical history of Allergy, Arthritis, COPD (chronic obstructive pulmonary disease) ([place]), and Glaucoma. Surgical: Ms. Acocella  has a past surgical history that includes Refractive surgery; Abdominal hysterectomy; Foot surgery; and Foot Fusion. Family: family history is not on file.  Constitutional Exam  General appearance: Well nourished, well developed, and well hydrated. In no apparent acute distress Vitals:   11/06/19 1356  BP: 135/69  Pulse: 69  Temp: 97.6 F (36.4 C)  SpO2: 100%  Weight: 135 lb (61.2 kg)  Height: 5' 3"  (1.6 m)   BMI Assessment: Estimated body mass index is 23.91 kg/m as calculated from the following:   Height as of this encounter: 5' 3"  (1.6 m).   Weight as of this encounter: 135 lb (61.2 kg).  BMI interpretation table: BMI level Category Range association with higher incidence of chronic  pain  <18 kg/m2 Underweight   18.5-24.9 kg/m2 Ideal body weight   25-29.9 kg/m2 Overweight Increased incidence by 20%  30-34.9 kg/m2 Obese (Class I) Increased incidence by 68%  35-39.9 kg/m2 Severe obesity (Class II) Increased incidence by 136%  >40 kg/m2 Extreme obesity (Class III) Increased incidence by 254%   Patient's current BMI Ideal Body weight  Body mass index is 23.91 kg/m. Ideal body weight: 52.4 kg (115 lb 8.3 oz) Adjusted ideal body weight: 55.9 kg (123 lb 5 oz)   BMI Readings from Last 4 Encounters:  11/06/19 23.91 kg/m  10/28/19 23.91 kg/m  09/08/19 23.03 kg/m  02/20/19 22.14 kg/m   Wt Readings from Last 4 Encounters:  11/06/19 135 lb (61.2 kg)  10/28/19 135 lb (61.2 kg)  09/08/19 130 lb (59 kg)  02/20/19 125 lb (56.7 kg)    Psych/Mental status: Alert, oriented x 3 (person, place, & time)       Eyes: PERLA Respiratory: No evidence of acute respiratory distress  Assessment   Status Diagnosis  Persistent Persistent Persistent 1. Chronic low back pain (1ry area of Pain) (Bilateral) (Midline) (L>R) w/o sciatica   2. Lumbar facet syndrome (Bilateral)   3. Chronic knee pain (2ry area of Pain) (Bilateral) (L>R)   4. Chronic neck pain (3ry area of Pain) (Bilateral) (L>R)   5. Chronic pain syndrome   6. Long term prescription opiate use   7. Pharmacologic therapy   8. Chronic musculoskeletal pain   9. Neurogenic pain   10. Osteoarthritis of the knees (Bilateral)      Updated Problems: No problems updated.  Plan of Care  Pharmacotherapy (Medications Ordered): Meds ordered this encounter  Medications  . methocarbamol (ROBAXIN) 750 MG tablet    Sig: Take 1 tablet (750 mg total) by mouth 3 (three) times daily.    Dispense:  90 tablet    Refill:  5    Fill one day early if pharmacy is closed on scheduled refill date. May substitute for generic, or similar, if available.  . gabapentin (NEURONTIN) 600 MG tablet    Sig: Take 1 tablet (600 mg total) by  mouth in the morning, at noon, in the evening, and at bedtime.    Dispense:  120 tablet    Refill:  5    Fill one day early if pharmacy is closed on scheduled refill date. May substitute for generic if available.  Marland Kitchen oxyCODONE (OXY IR/ROXICODONE) 5 MG immediate release tablet    Sig: Take 1 tablet (5 mg total) by mouth every 8 (eight) hours as needed for severe pain. Must last 30 days.    Dispense:  90 tablet    Refill:  0    Chronic Pain: STOP Act (Not applicable) Fill 1 day early if closed on refill date. Do not fill until: 11/06/2019. To last until: 12/06/2019. Avoid benzodiazepines within 8 hours of opioids  . meloxicam (MOBIC) 15 MG tablet    Sig: Take 1 tablet (15 mg total) by mouth daily.    Dispense:  90 tablet    Refill:  1    Do not add this medication to the electronic "Automatic Refill" notification system. Patient may have prescription filled one day early if pharmacy is closed on scheduled refill date.  . Turmeric 500 MG CAPS    Sig: Take 500 mg by mouth daily.    Dispense:  90 capsule    Refill:  3    OTC Recommendation. Please provide information on: where to find; how to take; side-effects; adverse reactions; drug-to-drug interactions; and contraindications. If unavailable, recommend similar substitute.  . Magnesium Oxide 500 MG CAPS    Sig: Take 1 capsule (500 mg total) by  mouth 2 (two) times daily at 8 am and 10 pm.    Dispense:  180 capsule    Refill:  3    Fill one day early if pharmacy is closed on scheduled refill date. May substitute for generic if available.   Medications administered today: Natasha Hanson had no medications administered during this visit.  Orders:  Orders Placed This Encounter  Procedures  . ToxASSURE Select 13 (MW), Urine    Volume: 30 ml(s). Minimum 3 ml of urine is needed. Document temperature of fresh sample. Indications: Long term (current) use of opiate analgesic (T62.263)    Order Specific Question:   Release to patient    Answer:    Immediate  . Nursing communication    Scheduling Instructions:     Complete/update the opioid risk tool (ORT) questionnaire.  . Nursing Instructions:    1. Medication Agreement: Please go over agreement with the patient. Have the patient read and sign the agreement. Provide patient with a copy of the signed agreement. 2. Make sure that the patient has completed the ORT (Opioid Risk Tool). 3. Provide the patient with a copy of our "Medicatiion Policy", "Medication Recommendations and Reminders", and "CBD information". 4. Remind the patient to always bring their medications and medication bottles (even if empty) to all appointments except for procedure appointments.    Scheduling Instructions:     Sign "Medication Agreement", complete "Opioid Risk Tool", inform patient of our practice "Medication Policies" (Pill counts, always bring bottles, except on procedure days).  . Nursing Instructions:    1). STAT: UDS required today. 2). Make sure to document all opioids and benzodiazepines taken, including time of last intake. 3). If order is entered on a procedure day, make sure sample is obtained before any medications are administered.    Lab Orders     ToxASSURE Select 13 (MW), Urine Imaging Orders  No imaging studies ordered today   Referral Orders  No referral(s) requested today   Planned follow-up:   Return in about 27 days (around 12/03/2019) for (F2F), (MM).      Interventional management options: Planned, scheduled, and/or pending:    Diagnostic bilateral lumbar facet block #1    Considering:   Diagnostic left lumbar ESI  Diagnostic bilateral lumbar facet block  Possible bilateral lumbar facet RFA  Diagnostic bilateral intra-articular knee joint injection  Possible bilateral Hyalgan knee injections  Diagnostic bilateral genicular nerve block  Possible bilateral genicular nerve RFA  Diagnostic left cervical ESI  Diagnostic bilateral cervical facet block  Possible bilateral  cervical facet RFA    PRN Procedures:   None at this time    Recent Visits Date Type Provider Dept  10/28/19 Procedure visit Milinda Pointer, MD [facility]  10/07/19 Telemedicine Milinda Pointer, MD [facility]  09/17/19 Telemedicine Milinda Pointer, MD [facility]  09/15/19 Telemedicine Milinda Pointer, MD [facility]  09/08/19 Office Visit Milinda Pointer, MD [facility]  Showing recent visits within past 90 days and meeting all other requirements   Today's Visits Date Type Provider Dept  11/06/19 Office Visit Milinda Pointer, MD [facility]  Showing today's visits and meeting all other requirements   Future Appointments No visits were found meeting these conditions.  Showing future appointments within next 90 days and meeting all other requirements   Total encounter time: 30 minutes  Primary Care Physician: System, Pcp Not In Location: [facility] Note by: Gaspar Cola, MD Date: 11/06/2019;  Time: 3:22 PM  Note: This dictation was prepared with Dragon dictation. Any transcriptional errors that may result from this process are unintentional.

## 2019-11-06 NOTE — Patient Instructions (Addendum)
____________________________________________________________________________________________  Medication Rules  Purpose: To inform patients, and their family members, of our rules and regulations.  Applies to: All patients receiving prescriptions (written or electronic).  Pharmacy of record: Pharmacy where electronic prescriptions will be sent. If written prescriptions are taken to a different pharmacy, please inform the nursing staff. The pharmacy listed in the electronic medical record should be the one where you would like electronic prescriptions to be sent.  Electronic prescriptions: In compliance with the [place] Strengthen Opioid Misuse Prevention (STOP) Act of 2017 (Session Law 2017-74/H243), effective June 12, 2018, all controlled substances must be electronically prescribed. Calling prescriptions to the pharmacy will cease to exist.  Prescription refills: Only during scheduled appointments. Applies to all prescriptions.  NOTE: The following applies primarily to controlled substances (Opioid* Pain Medications).   Patient's responsibilities: 1. Pain Pills: Bring all pain pills to every appointment (except for procedure appointments). 2. Pill Bottles: Bring pills in original pharmacy bottle. Always bring the newest bottle. Bring bottle, even if empty. 3. Medication refills: You are responsible for knowing and keeping track of what medications you take and those you need refilled. The day before your appointment: write a list of all prescriptions that need to be refilled. The day of the appointment: give the list to the admitting nurse. Prescriptions will be written only during appointments. No prescriptions will be written on procedure days. If you forget a medication: it will not be "Called in", "Faxed", or "electronically sent". You will need to get another appointment to get these prescribed. No early refills. Do not call asking to have your prescription filled  early. 4. Prescription Accuracy: You are responsible for carefully inspecting your prescriptions before leaving our office. Have the discharge nurse carefully go over each prescription with you, before taking them home. Make sure that your name is accurately spelled, that your address is correct. Check the name and dose of your medication to make sure it is accurate. Check the number of pills, and the written instructions to make sure they are clear and accurate. Make sure that you are given enough medication to last until your next medication refill appointment. 5. Taking Medication: Take medication as prescribed. When it comes to controlled substances, taking less pills or less frequently than prescribed is permitted and encouraged. Never take more pills than instructed. Never take medication more frequently than prescribed.  6. Inform other Doctors: Always inform, all of your healthcare providers, of all the medications you take. 7. Pain Medication from other Providers: You are not allowed to accept any additional pain medication from any other Doctor or Healthcare provider. There are two exceptions to this rule. (see below) In the event that you require additional pain medication, you are responsible for notifying us, as stated below. 8. Medication Agreement: You are responsible for carefully reading and following our Medication Agreement. This must be signed before receiving any prescriptions from our practice. Safely store a copy of your signed Agreement. Violations to the Agreement will result in no further prescriptions. (Additional copies of our Medication Agreement are available upon request.) 9. Laws, Rules, & Regulations: All patients are expected to follow all Federal and State Laws, Statutes, Rules, & Regulations. Ignorance of the Laws does not constitute a valid excuse.  10. Illegal drugs and Controlled Substances: The use of illegal substances (including, but not limited to marijuana and its  derivatives) and/or the illegal use of any controlled substances is strictly prohibited. Violation of this rule may result in the immediate and   permanent discontinuation of any and all prescriptions being written by our practice. The use of any illegal substances is prohibited. 11. Adopted CDC guidelines & recommendations: Target dosing levels will be at or below 60 MME/day. Use of benzodiazepines** is not recommended.  Exceptions: There are only two exceptions to the rule of not receiving pain medications from other Healthcare Providers. 1. Exception #1 (Emergencies): In the event of an emergency (i.e.: accident requiring emergency care), you are allowed to receive additional pain medication. However, you are responsible for: As soon as you are able, call our office [phone], at any time of the day or night, and leave a message stating your name, the date and nature of the emergency, and the name and dose of the medication prescribed. In the event that your call is answered by a member of our staff, make sure to document and save the date, time, and the name of the person that took your information.  2. Exception #2 (Planned Surgery): In the event that you are scheduled by another doctor or dentist to have any type of surgery or procedure, you are allowed (for a period no longer than 30 days), to receive additional pain medication, for the acute post-op pain. However, in this case, you are responsible for picking up a copy of our "Post-op Pain Management for Surgeons" handout, and giving it to your surgeon or dentist. This document is available at our office, and does not require an appointment to obtain it. Simply go to our office during business hours (Monday-Thursday from 8:00 AM to 4:00 PM) (Friday 8:00 AM to 12:00 Noon) or if you have a scheduled appointment with us, prior to your surgery, and ask for it by name. In addition, you will need to provide us with your name, name of your surgeon, type of  surgery, and date of procedure or surgery.  *Opioid medications include: morphine, codeine, oxycodone, oxymorphone, hydrocodone, hydromorphone, meperidine, tramadol, tapentadol, buprenorphine, fentanyl, methadone. **Benzodiazepine medications include: diazepam (Valium), alprazolam (Xanax), clonazepam (Klonopine), lorazepam (Ativan), clorazepate (Tranxene), chlordiazepoxide (Librium), estazolam (Prosom), oxazepam (Serax), temazepam (Restoril), triazolam (Halcion) (Last updated: 08/09/2017) ____________________________________________________________________________________________   ____________________________________________________________________________________________  Medication Recommendations and Reminders  Applies to: All patients receiving prescriptions (written and/or electronic).  Medication Rules & Regulations: These rules and regulations exist for your safety and that of others. They are not flexible and neither are we. Dismissing or ignoring them will be considered "non-compliance" with medication therapy, resulting in complete and irreversible termination of such therapy. (See document titled "Medication Rules" for more details.) In all conscience, because of safety reasons, we cannot continue providing a therapy where the patient does not follow instructions.  Pharmacy of record:   Definition: This is the pharmacy where your electronic prescriptions will be sent.   We do not endorse any particular pharmacy.  You are not restricted in your choice of pharmacy.  The pharmacy listed in the electronic medical record should be the one where you want electronic prescriptions to be sent.  If you choose to change pharmacy, simply notify our nursing staff of your choice of new pharmacy.  Recommendations:  Keep all of your pain medications in a safe place, under lock and key, even if you live alone.   After you fill your prescription, take 1 week's worth of pills and put them  away in a safe place. You should keep a separate, properly labeled bottle for this purpose. The remainder should be kept in the original bottle. Use this as your primary supply,   until it runs out. Once it's gone, then you know that you have 1 week's worth of medicine, and it is time to come in for a prescription refill. If you do this correctly, it is unlikely that you will ever run out of medicine.  To make sure that the above recommendation works, it is very important that you make sure your medication refill appointments are scheduled at least 1 week before you run out of medicine. To do this in an effective manner, make sure that you do not leave the office without scheduling your next medication management appointment. Always ask the nursing staff to show you in your prescription , when your medication will be running out. Then arrange for the receptionist to get you a return appointment, at least 7 days before you run out of medicine. Do not wait until you have 1 or 2 pills left, to come in. This is very poor planning and does not take into consideration that we may need to cancel appointments due to bad weather, sickness, or emergencies affecting our staff.  "Partial Fill": If for any reason your pharmacy does not have enough pills/tablets to completely fill or refill your prescription, do not allow for a "partial fill". You will need a separate prescription to fill the remaining amount, which we will not provide. If the reason for the partial fill is your insurance, you will need to talk to the pharmacist about payment alternatives for the remaining tablets, but again, do not accept a partial fill.  Prescription refills and/or changes in medication(s):   Prescription refills, and/or changes in dose or medication, will be conducted only during scheduled medication management appointments. (Applies to both, written and electronic prescriptions.)  No refills on procedure days. No medication will be  changed or started on procedure days. No changes, adjustments, and/or refills will be conducted on a procedure day. Doing so will interfere with the diagnostic portion of the procedure.  No phone refills. No medications will be "called into the pharmacy".  No Fax refills.  No weekend refills.  No Holliday refills.  No after hours refills.  Remember:  Business hours are:  Monday to Thursday 8:00 AM to 4:00 PM Provider's Schedule: Kenyona Rena, MD - Appointments are:  Medication management: Monday and Wednesday 8:00 AM to 4:00 PM Procedure day: Tuesday and Thursday 7:30 AM to 4:00 PM Bilal Lateef, MD - Appointments are:  Medication management: Tuesday and Thursday 8:00 AM to 4:00 PM Procedure day: Monday and Wednesday 7:30 AM to 4:00 PM (Last update: 08/09/2017) ____________________________________________________________________________________________   ____________________________________________________________________________________________  CANNABIDIOL (AKA: CBD Oil or Pills)  Applies to: All patients receiving prescriptions of controlled substances (written and/or electronic).  General Information: Cannabidiol (CBD) was discovered in 1940. It is one of some 113 identified cannabinoids in cannabis (Marijuana) plants, accounting for up to 40% of the plant's extract. As of 2018, preliminary clinical research on cannabidiol included studies of anxiety, cognition, movement disorders, and pain.  Cannabidiol is consummed in multiple ways, including inhalation of cannabis smoke or vapor, as an aerosol spray into the cheek, and by mouth. It may be supplied as CBD oil containing CBD as the active ingredient (no added tetrahydrocannabinol (THC) or terpenes), a full-plant CBD-dominant hemp extract oil, capsules, dried cannabis, or as a liquid solution. CBD is thought not have the same psychoactivity as THC, and may affect the actions of THC. Studies suggest that CBD may interact with  different biological targets, including cannabinoid receptors and other neurotransmitter receptors. As   of 2018 the mechanism of action for its biological effects has not been determined.  In the [place], cannabidiol has a limited approval by the Food and Drug Administration (FDA) for treatment of only two types of epilepsy disorders. The side effects of long-term use of the drug include somnolence, decreased appetite, diarrhea, fatigue, malaise, weakness, sleeping problems, and others.  CBD remains a Schedule I drug prohibited for any use.  Legality: Some manufacturers ship CBD products nationally, an illegal action which the FDA has not enforced in 2018, with CBD remaining the subject of an FDA investigational new drug evaluation, and is not considered legal as a dietary supplement or food ingredient as of December 2018. Federal illegality has made it difficult historically to conduct research on CBD. CBD is openly sold in head shops and health food stores in some states where such sales have not been explicitly legalized.  Warning: Because it is not FDA approved for general use or treatment of pain, it is not required to undergo the same manufacturing controls as prescription drugs.  This means that the available cannabidiol (CBD) may be contaminated with THC.  If this is the case, it will trigger a positive urine drug screen (UDS) test for cannabinoids (Marijuana).  Because a positive UDS for illicit substances is a violation of our medication agreement, your opioid analgesics (pain medicine) may be permanently discontinued. (Last update: 08/30/2017) ____________________________________________________________________________________________   ____________________________________________________________________________________________  Drug Holidays (Slow)  What is a "Drug Holiday"? Drug Holiday: is the name given to the period of time during which a patient stops taking a medication(s)  for the purpose of eliminating tolerance to the drug.  Benefits . Improved effectiveness of opioids. . Decreased opioid dose needed to achieve benefits. . Improved pain with lesser dose.  What is tolerance? Tolerance: is the progressive decreased in effectiveness of a drug due to its repetitive use. With repetitive use, the body gets use to the medication and as a consequence, it loses its effectiveness. This is a common problem seen with opioid pain medications. As a result, a larger dose of the drug is needed to achieve the same effect that used to be obtained with a smaller dose.  How long should a "Drug Holiday" last? You should stay off of the pain medicine for at least 14 consecutive days. (2 weeks)  Should I stop the medicine "cold turkey"? No. You should always coordinate with your Pain Specialist so that he/she can provide you with the correct medication dose to make the transition as smoothly as possible.  How do I stop the medicine? Slowly. You will be instructed to decrease the daily amount of pills that you take by one (1) pill every seven (7) days. This is called a "slow downward taper" of your dose. For example: if you normally take four (4) pills per day, you will be asked to drop this dose to three (3) pills per day for seven (7) days, then to two (2) pills per day for seven (7) days, then to one (1) per day for seven (7) days, and at the end of those last seven (7) days, this is when the "Drug Holiday" would start.   Will I have withdrawals? By doing a "slow downward taper" like this one, it is unlikely that you will experience any significant withdrawal symptoms. Typically, what triggers withdrawals is the sudden stop of a high dose opioid therapy. Withdrawals can usually be avoided by slowly decreasing the dose over a prolonged period of time.    What are withdrawals? Withdrawals: refers to the wide range of symptoms that occur after stopping or dramatically reducing opiate  drugs after heavy and prolonged use. Withdrawal symptoms do not occur to patients that use low dose opioids, or those who take the medication sporadically. Contrary to benzodiazepine (example: Valium, Xanax, etc.) or alcohol withdrawals ("Delirium Tremens"), opioid withdrawals are not lethal. Withdrawals are the physical manifestation of the body getting rid of the excess receptors.  Expected Symptoms Early symptoms of withdrawal may include: . Agitation . Anxiety . Muscle aches . Increased tearing . Insomnia . Runny nose . Sweating . Yawning  Late symptoms of withdrawal may include: . Abdominal cramping . Diarrhea . Dilated pupils . Goose bumps . Nausea . Vomiting  Will I experience withdrawals? Due to the slow nature of the taper, it is very unlikely that you will experience any.  What is a slow taper? Taper: refers to the gradual decrease in dose.  ___________________________________________________________________________________________     

## 2019-11-10 LAB — TOXASSURE SELECT 13 (MW), URINE

## 2019-11-30 NOTE — Progress Notes (Deleted)
No show

## 2019-12-01 ENCOUNTER — Ambulatory Visit: Payer: Self-pay | Admitting: Pain Medicine

## 2019-12-01 DIAGNOSIS — M5136 Other intervertebral disc degeneration, lumbar region: Secondary | ICD-10-CM | POA: Insufficient documentation

## 2019-12-10 NOTE — Progress Notes (Signed)
PROVIDER NOTE: Information contained herein reflects review and annotations entered in association with encounter. Interpretation of such information and data should be left to medically-trained personnel. Information provided to patient can be located elsewhere in the medical record under "Patient Instructions". Document created using STT-dictation technology, any transcriptional errors that may result from process are unintentional.    Patient: Natasha Hanson  Service Category: E/M  Provider: Gaspar Cola, MD  DOB: Dec 31, 1965  DOS: 12/11/2019  Specialty: Interventional Pain Management  MRN: [id number]  Setting: Ambulatory outpatient  PCP: System, Pcp Not In  Type: Established Patient    Referring Provider: No ref. provider found  Location: Office  Delivery: Face-to-face     HPI  Reason for encounter: Natasha Hanson, a 54 y.o. year old female, is here today for evaluation and management of her Chronic pain syndrome [G89.4]. Natasha Hanson primary complain today is Knee Pain Last encounter: Practice (11/06/2019). My last encounter with her was on 11/06/2019. Pertinent problems: Natasha Hanson has Chronic pain syndrome; Cervicalgia; DDD (degenerative disc disease), cervical; Cervical facet hypertrophy; Cervical facet syndrome; Cervical central spinal stenosis; Abnormal MRI, cervical spine (02/20/2019); Cervical foraminal stenosis; Chronic low back pain (1ry area of Pain) (Bilateral) (Midline) (L>R) w/o sciatica; Chronic knee pain (2ry area of Pain) (Bilateral) (L>R); Chronic neck pain (3ry area of Pain) (Bilateral) (L>R); DDD (degenerative disc disease), lumbosacral; Lumbar facet hypertrophy (Multilevel) (Bilateral); Lumbar facet syndrome (Bilateral); Osteoarthritis of knee (Left); Osteoarthritis of knee (Right); Osteoarthritis of the knees (Bilateral); Chronic sacroiliac joint pain (Bilateral); Somatic dysfunction of sacroiliac joints (Bilateral); Other spondylosis, sacral and sacrococcygeal region;  Spondylosis without myelopathy or radiculopathy, lumbosacral region; Chronic hip pain (Bilateral); Neurogenic pain; Chronic musculoskeletal pain; Arm numbness; and Other intervertebral disc degeneration, lumbar region on their pertinent problem list. Pain Assessment: Severity of Chronic pain is reported as a 10-Worst pain ever/10. Location: Knee Left, Right/Denies. Onset: More than a month ago. Quality: Aching, Burning, Throbbing, Nagging, Shooting, Discomfort, Constant. Timing: Constant. Modifying factor(s): meds at times. Vitals:  height is 5' 3"  (1.6 m) and weight is 134 lb (60.8 kg). Her temperature is 97.6 F (36.4 C). Her blood pressure is 96/57 (abnormal) and her pulse is 60. Her oxygen saturation is 100%.    The patient indicates doing well with the current medication regimen.  However, she has requested that I switch her back to the hydrocodone/APAP indicating that she feels that that works better for her.  No adverse reactions or side effects reported to the medications.  I informed the patient that I would prefer she be taking medications that did not contain acetaminophen associated with it.  However, I also informed her that if she really wanted me to go back to the hydrocodone/APAP I had no problems with it since the equivalent strength of it is actually lower than that of the oxycodone.  Once she her that, she suddenly decided that she wanted to stay on the oxycodone.  Again, I reminded her that I would have absolutely no problem switching her to the hydrocodone/APAP if she wanted to, but I simply did not want to be switching medications around and therefore if she decided to make that change, she would need to stay on it for as long as she remains a patient of mine.  Again, she thought about it for a couple of seconds and told me that she rather stay on the oxycodone.  Pharmacotherapy Assessment   Analgesic: Oxycodone IR 5 mg every 8 hours (15 mg/day of oxycodone)  Highest recorded MME/day:  30 mg/day MME/day: 22.5 mg/day   Monitoring:  PMP: PDMP reviewed during this encounter.       Pharmacotherapy: No side-effects or adverse reactions reported. Compliance: No problems identified. Effectiveness: Clinically acceptable.  Chauncey Fischer, RN  12/11/2019  1:22 PM  Sign when Signing Visit Nursing Pain Medication Assessment:  Safety precautions to be maintained throughout the outpatient stay will include: orient to surroundings, keep bed in low position, maintain call bell within reach at all times, provide assistance with transfer out of bed and ambulation.  Medication Inspection Compliance: Pill count conducted under aseptic conditions, in front of the patient. Neither the pills nor the bottle was removed from the patient's sight at any time. Once count was completed pills were immediately returned to the patient in their original bottle.  Medication: Oxycodone IR Pill/Patch Count: 0 of 90 pills remain Pill/Patch Appearance: Markings consistent with prescribed medication Bottle Appearance: Standard pharmacy container. Clearly labeled. Filled Date: 5 / 27 / 2021 Last Medication intake:  YesterdaySafety precautions to be maintained throughout the outpatient stay will include: orient to surroundings, keep bed in low position, maintain call bell within reach at all times, provide assistance with transfer out of bed and ambulation.     UDS:  Summary  Date Value Ref Range Status  11/06/2019 Note  Final    Comment:    ==================================================================== [employer] (MW) ==================================================================== Test                             Result       Flag       Units Drug Present and Declared for Prescription Verification   Oxycodone                      942          EXPECTED   ng/mg creat   Oxymorphone                    1903         EXPECTED   ng/mg creat   Noroxycodone                   3335          EXPECTED   ng/mg creat   Noroxymorphone                 1689         EXPECTED   ng/mg creat    Sources of oxycodone are scheduled prescription medications.    Oxymorphone, noroxycodone, and noroxymorphone are expected    metabolites of oxycodone. Oxymorphone is also available as a    scheduled prescription medication. ==================================================================== Test                      Result    Flag   Units      Ref Range   Creatinine              117              mg/dL      >=20 ==================================================================== Declared Medications:  The flagging and interpretation on this report are based on the  following declared medications.  Unexpected results may arise from  inaccuracies in the declared medications.  **Note: The testing scope of this panel includes these medications:  Oxycodone  **Note: The testing scope of  this panel does not include the  following reported medications:  Albuterol (Ventolin HFA)  Gabapentin (Neurontin)  Magnesium  Meloxicam (Mobic)  Methocarbamol (Robaxin)  Propranolol (Inderal)  Turmeric ==================================================================== For clinical consultation, please call [phone]. ====================================================================      ROS  Constitutional: Denies any fever or chills Gastrointestinal: No reported hemesis, hematochezia, vomiting, or acute GI distress Musculoskeletal: Denies any acute onset joint swelling, redness, loss of ROM, or weakness Neurological: No reported episodes of acute onset apraxia, aphasia, dysarthria, agnosia, amnesia, paralysis, loss of coordination, or loss of consciousness  Medication Review  Magnesium Oxide, Turmeric, albuterol, gabapentin, meloxicam, methocarbamol, oxyCODONE, and propranolol  History Review  Allergy: Natasha Hanson has No Known Allergies. Drug: Natasha Hanson  has no history on file for drug  use. Alcohol:  reports no history of alcohol use. Tobacco:  reports that she has been smoking. She has never used smokeless tobacco. Social: Natasha Hanson  reports that she has been smoking. She has never used smokeless tobacco. She reports that she does not drink alcohol. Medical:  has a past medical history of Allergy, Arthritis, COPD (chronic obstructive pulmonary disease) ([place]), and Glaucoma. Surgical: Natasha Hanson  has a past surgical history that includes Refractive surgery; Abdominal hysterectomy; Foot surgery; and Foot Fusion. Family: family history is not on file.  Laboratory Chemistry Profile   Renal Lab Results  Component Value Date   BUN 9 09/09/2019   CREATININE 0.73 09/09/2019   BCR 12 09/09/2019   GFRAA 109 09/09/2019   GFRNONAA 94 09/09/2019     Hepatic Lab Results  Component Value Date   AST 16 09/09/2019   ALT 14 02/15/2019   ALBUMIN 4.3 09/09/2019   ALKPHOS 70 09/09/2019   LIPASE 21 01/05/2018     Electrolytes Lab Results  Component Value Date   NA 139 09/09/2019   K 4.7 09/09/2019   CL 101 09/09/2019   CALCIUM 9.5 09/09/2019   MG 1.8 09/09/2019     Bone Lab Results  Component Value Date   25OHVITD1 33 09/09/2019   25OHVITD2 <1.0 09/09/2019   25OHVITD3 33 09/09/2019     Inflammation (CRP: Acute Phase) (ESR: Chronic Phase) Lab Results  Component Value Date   CRP 2 09/09/2019   ESRSEDRATE 7 09/09/2019       Note: Above Lab results reviewed.  Recent Imaging Review  DG PAIN CLINIC C-ARM 1-60 MIN NO REPORT Fluoro was used, but no Radiologist interpretation will be provided.  Please refer to "NOTES" tab for provider progress note. Note: Reviewed        Physical Exam  General appearance: Well nourished, well developed, and well hydrated. In no apparent acute distress Mental status: Alert, oriented x 3 (person, place, & time)       Respiratory: No evidence of acute respiratory distress Eyes: PERLA Vitals: BP (!) 96/57   Pulse 60   Temp 97.6 F  (36.4 C)   Ht 5' 3"  (1.6 m)   Wt 134 lb (60.8 kg)   SpO2 100%   BMI 23.74 kg/m  BMI: Estimated body mass index is 23.74 kg/m as calculated from the following:   Height as of this encounter: 5' 3"  (1.6 m).   Weight as of this encounter: 134 lb (60.8 kg). Ideal: Ideal body weight: 52.4 kg (115 lb 8.3 oz) Adjusted ideal body weight: 55.8 kg (122 lb 14.6 oz)  Assessment   Status Diagnosis  Controlled Controlled Controlled 1. Chronic pain syndrome   2. Chronic low back pain (1ry area  of Pain) (Bilateral) (Midline) (L>R) w/o sciatica   3. Chronic knee pain (2ry area of Pain) (Bilateral) (L>R)   4. Pharmacologic therapy   5. Chronic musculoskeletal pain      Updated Problems: No problems updated.  Plan of Care  Problem-specific:  No problem-specific Assessment & Plan notes found for this encounter.  Natasha Hanson has a current medication list which includes the following long-term medication(s): albuterol, gabapentin, [START ON 01/05/2020] magnesium oxide, meloxicam, methocarbamol, propranolol, [START ON 01/05/2020] turmeric, oxycodone, [START ON 01/10/2020] oxycodone, and [START ON 02/09/2020] oxycodone.  Pharmacotherapy (Medications Ordered): Meds ordered this encounter  Medications  . oxyCODONE (OXY IR/ROXICODONE) 5 MG immediate release tablet    Sig: Take 1 tablet (5 mg total) by mouth every 8 (eight) hours as needed for severe pain. Must last 30 days.    Dispense:  90 tablet    Refill:  0    Chronic Pain: STOP Act (Not applicable) Fill 1 day early if closed on refill date. Do not fill until: 12/11/2019. To last until: 01/10/2020. Avoid benzodiazepines within 8 hours of opioids  . oxyCODONE (OXY IR/ROXICODONE) 5 MG immediate release tablet    Sig: Take 1 tablet (5 mg total) by mouth every 8 (eight) hours as needed for severe pain. Must last 30 days.    Dispense:  90 tablet    Refill:  0    Chronic Pain: STOP Act (Not applicable) Fill 1 day early if closed on refill date. Do  not fill until: 01/10/2020. To last until: 02/09/2020. Avoid benzodiazepines within 8 hours of opioids  . oxyCODONE (OXY IR/ROXICODONE) 5 MG immediate release tablet    Sig: Take 1 tablet (5 mg total) by mouth every 8 (eight) hours as needed for severe pain. Must last 30 days.    Dispense:  90 tablet    Refill:  0    Chronic Pain: STOP Act (Not applicable) Fill 1 day early if closed on refill date. Do not fill until: 02/09/2020. To last until: 03/10/2020. Avoid benzodiazepines within 8 hours of opioids   Orders:  No orders of the defined types were placed in this encounter.  Follow-up plan:   Return in about 3 months (around 03/10/2020) for F2F(20-min), MM (never on procedure day).      Interventional management options: Planned, scheduled, and/or pending:    Diagnostic bilateral lumbar facet block #1    Considering:   Diagnostic left lumbar ESI  Diagnostic bilateral lumbar facet block  Possible bilateral lumbar facet RFA  Diagnostic bilateral intra-articular knee joint injection  Possible bilateral Hyalgan knee injections  Diagnostic bilateral genicular nerve block  Possible bilateral genicular nerve RFA  Diagnostic left cervical ESI  Diagnostic bilateral cervical facet block  Possible bilateral cervical facet RFA    PRN Procedures:   None at this time     Recent Visits Date Type Provider Dept  11/06/19 Office Visit Milinda Pointer, MD [facility]  10/28/19 Procedure visit Milinda Pointer, MD [facility]  10/07/19 Telemedicine Milinda Pointer, MD [facility]  09/17/19 Telemedicine Milinda Pointer, MD [facility]  09/15/19 Telemedicine Milinda Pointer, MD [facility]  Showing recent visits within past 90 days and meeting all other requirements Today's Visits Date Type Provider Dept  12/11/19 Office Visit Milinda Pointer, MD [facility]  Showing today's visits and meeting all other requirements Future  Appointments Date Type Provider Dept  03/08/20 Appointment Milinda Pointer, MD [facility]  Showing future appointments within  next 90 days and meeting all other requirements  I discussed the assessment and treatment plan with the patient. The patient was provided an opportunity to ask questions and all were answered. The patient agreed with the plan and demonstrated an understanding of the instructions.  Patient advised to call back or seek an in-person evaluation if the symptoms or condition worsens.  Duration of encounter: 30 minutes.  Note by: Gaspar Cola, MD Date: 12/11/2019; Time: 3:08 PM

## 2019-12-11 ENCOUNTER — Other Ambulatory Visit: Payer: Self-pay

## 2019-12-11 ENCOUNTER — Ambulatory Visit: Payer: Self-pay | Attending: Pain Medicine | Admitting: Pain Medicine

## 2019-12-11 ENCOUNTER — Encounter: Payer: Self-pay | Admitting: Pain Medicine

## 2019-12-11 VITALS — BP 96/57 | HR 60 | Temp 97.6°F | Ht 63.0 in | Wt 134.0 lb

## 2019-12-11 DIAGNOSIS — G8929 Other chronic pain: Secondary | ICD-10-CM | POA: Insufficient documentation

## 2019-12-11 DIAGNOSIS — Z79899 Other long term (current) drug therapy: Secondary | ICD-10-CM | POA: Insufficient documentation

## 2019-12-11 DIAGNOSIS — M545 Low back pain: Secondary | ICD-10-CM | POA: Insufficient documentation

## 2019-12-11 DIAGNOSIS — M25561 Pain in right knee: Secondary | ICD-10-CM | POA: Insufficient documentation

## 2019-12-11 DIAGNOSIS — G894 Chronic pain syndrome: Secondary | ICD-10-CM | POA: Insufficient documentation

## 2019-12-11 DIAGNOSIS — M7918 Myalgia, other site: Secondary | ICD-10-CM | POA: Insufficient documentation

## 2019-12-11 DIAGNOSIS — M25562 Pain in left knee: Secondary | ICD-10-CM | POA: Insufficient documentation

## 2019-12-11 MED ORDER — OXYCODONE HCL 5 MG PO TABS: 5.0000 mg | ORAL_TABLET | Freq: Three times a day (TID) | ORAL | 0 refills | Status: DC | PRN

## 2019-12-11 NOTE — Progress Notes (Signed)
Nursing Pain Medication Assessment:  Safety precautions to be maintained throughout the outpatient stay will include: orient to surroundings, keep bed in low position, maintain call bell within reach at all times, provide assistance with transfer out of bed and ambulation.  Medication Inspection Compliance: Pill count conducted under aseptic conditions, in front of the patient. Neither the pills nor the bottle was removed from the patient's sight at any time. Once count was completed pills were immediately returned to the patient in their original bottle.  Medication: Oxycodone IR Pill/Patch Count: 0 of 90 pills remain Pill/Patch Appearance: Markings consistent with prescribed medication Bottle Appearance: Standard pharmacy container. Clearly labeled. Filled Date: 5 / 27 / 2021 Last Medication intake:  YesterdaySafety precautions to be maintained throughout the outpatient stay will include: orient to surroundings, keep bed in low position, maintain call bell within reach at all times, provide assistance with transfer out of bed and ambulation.

## 2019-12-11 NOTE — Patient Instructions (Signed)

## 2019-12-22 ENCOUNTER — Ambulatory Visit: Payer: Self-pay | Admitting: Pain Medicine

## 2019-12-30 ENCOUNTER — Emergency Department: Payer: Self-pay

## 2019-12-30 ENCOUNTER — Emergency Department
Admission: EM | Admit: 2019-12-30 | Discharge: 2019-12-30 | Disposition: A | Discharge status: Home / Self Care | Payer: Self-pay | Attending: Emergency Medicine | Admitting: Emergency Medicine

## 2019-12-30 ENCOUNTER — Other Ambulatory Visit: Payer: Self-pay

## 2019-12-30 DIAGNOSIS — F17200 Nicotine dependence, unspecified, uncomplicated: Secondary | ICD-10-CM | POA: Insufficient documentation

## 2019-12-30 DIAGNOSIS — Z7951 Long term (current) use of inhaled steroids: Secondary | ICD-10-CM | POA: Insufficient documentation

## 2019-12-30 DIAGNOSIS — J449 Chronic obstructive pulmonary disease, unspecified: Secondary | ICD-10-CM | POA: Insufficient documentation

## 2019-12-30 DIAGNOSIS — K2900 Acute gastritis without bleeding: Secondary | ICD-10-CM | POA: Insufficient documentation

## 2019-12-30 LAB — COMPREHENSIVE METABOLIC PANEL
ALT: 13 U/L (ref 0–44)
AST: 15 U/L (ref 15–41)
Albumin: 3.9 g/dL (ref 3.5–5.0)
Alkaline Phosphatase: 67 U/L (ref 38–126)
Anion gap: 8 (ref 5–15)
BUN: 15 mg/dL (ref 6–20)
CO2: 25 mmol/L (ref 22–32)
Calcium: 8.9 mg/dL (ref 8.9–10.3)
Chloride: 105 mmol/L (ref 98–111)
Creatinine, Ser: 0.73 mg/dL (ref 0.44–1.00)
GFR calc Af Amer: >60 mL/min (ref >60)
GFR calc non Af Amer: >60 mL/min (ref >60)
Glucose, Bld: 93 mg/dL (ref 70–99)
Potassium: 3.9 mmol/L (ref 3.5–5.1)
Sodium: 138 mmol/L (ref 135–145)
Total Bilirubin: 1 mg/dL (ref 0.3–1.2)
Total Protein: 7 g/dL (ref 6.5–8.1)

## 2019-12-30 LAB — CBC
HCT: 42.4 % (ref 36.0–46.0)
Hemoglobin: 14.7 g/dL (ref 12.0–15.0)
MCH: 34.1 pg — ABNORMAL HIGH (ref 26.0–34.0)
MCHC: 34.7 g/dL (ref 30.0–36.0)
MCV: 98.4 fL (ref 80.0–100.0)
Platelets: 380 10*3/uL (ref 150–400)
RBC: 4.31 MIL/uL (ref 3.87–5.11)
RDW: 13.2 % (ref 11.5–15.5)
WBC: 11.5 10*3/uL — ABNORMAL HIGH (ref 4.0–10.5)
nRBC: 0 % (ref 0.0–0.2)

## 2019-12-30 LAB — URINALYSIS, COMPLETE (UACMP) WITH MICROSCOPIC
Bacteria, UA: NONE SEEN
Glucose, UA: NEGATIVE mg/dL
Hgb urine dipstick: NEGATIVE
Ketones, ur: NEGATIVE mg/dL
Leukocytes,Ua: NEGATIVE
Nitrite: NEGATIVE
Protein, ur: NEGATIVE mg/dL
Specific Gravity, Urine: 1.017 (ref 1.005–1.030)
pH: 6 (ref 5.0–8.0)

## 2019-12-30 LAB — LIPASE, BLOOD: Lipase: 20 U/L (ref 11–51)

## 2019-12-30 LAB — TROPONIN I (HIGH SENSITIVITY): Troponin I (High Sensitivity): 5 ng/L (ref <18)

## 2019-12-30 MED ORDER — SODIUM CHLORIDE 0.9% FLUSH
3.0000 mL | Freq: Once | INTRAVENOUS | Status: AC
  Administered 2019-12-30: 3 mL via INTRAVENOUS

## 2019-12-30 MED ORDER — IOHEXOL 300 MG/ML  SOLN
100.0000 mL | Freq: Once | INTRAMUSCULAR | Status: AC | PRN
  Administered 2019-12-30: 100 mL via INTRAVENOUS
  Filled 2019-12-30: qty 100

## 2019-12-30 MED ORDER — SUCRALFATE 1 G PO TABS
1.0000 g | ORAL_TABLET | Freq: Three times a day (TID) | ORAL | 0 refills | Status: DC
Start: 2019-12-30 — End: 2021-02-02

## 2019-12-30 NOTE — ED Triage Notes (Signed)
Pt c/o mid abd pain for the past 2 days, denies n/V/d.. was sent from PCP

## 2019-12-30 NOTE — Discharge Instructions (Addendum)
Your CT scan is as below.  Some incidental findings she can follow-up with your primary care doctor.  I suspect this is gastritis secondary to your naproxen use.  Please stop using naproxen.  Continue taking your PPI that you get.  Have added on some Carafate to help line the stomach.  Take Tylenol 1 g every 8 hours instead of the naproxen.  Please call GI to make a follow-up appointment.    FINDINGS:  Lower chest: The visualized lung bases are clear bilaterally. The  visualized heart and pericardium are unremarkable.     Hepatobiliary: There is diffusely heterogeneous enhancement of the  liver, a nonspecific finding that can reflect underlying changes of  geographic hepatic steatosis. There is mild hepatomegaly. Focal  fatty hepatic infiltration adjacent to the falciform ligament. A  poorly circumscribed region of hyperenhancement within segment 4B of  the liver may represent an area of focal fatty sparing. Tiny  probable cyst noted laterally within the right hepatic lobe. There  is no intra or extrahepatic biliary ductal dilation. The gallbladder  is unremarkable.     Pancreas: Unremarkable     Spleen: Unremarkable     Adrenals/Urinary Tract: The adrenal glands are unremarkable tiny  simple cortical cyst within the interpolar region of the left  kidney. The kidneys are otherwise unremarkable. The bladder is  largely decompressed.     Stomach/Bowel: The stomach, large bowel, and small bowel are  unremarkable. Appendix normal. No free intraperitoneal gas or fluid.  Tiny fat containing umbilical hernia.     Vascular/Lymphatic: No pathologic adenopathy within the abdomen and  pelvis. The abdominal vasculature is age-appropriate with mild  aortoiliac atherosclerotic calcification. No aneurysm.     Reproductive: Uterus absent. There is relative enlargement of the  right ovary measuring 2.8 x 2.0 x 2.0 cm in greatest dimension. This  is unchanged from prior examination and is likely  benign given its  stability over time,.     Other: The rectum is unremarkable.     Musculoskeletal: Degenerative changes are seen within the lumbar  spine. No acute bone abnormality.     IMPRESSION:  No definite radiographic explanation for the patient's reported  abdominal distension and abdominal pain. Incidental findings as  noted above. Heterogeneous enhancement of the liver likely  represents geographic fatty hepatic infiltration and areas of fatty  sparing and could be confirmed with liver protocol MRI examination,  if indicated.

## 2019-12-30 NOTE — ED Notes (Signed)
Says 3 days of mid abdominal pain that increases when she moves. abd soft but tender with  Palp. Says no vomiting or diarrhea.  Last bm yesterday.  She has not gotten her urine specimen yet, but will try now.  Took her to bathroom in wheel chair.

## 2019-12-30 NOTE — ED Provider Notes (Signed)
[facility] Emergency Department Provider Note  ____________________________________________   First MD Initiated Contact with Patient 12/30/19 1326     (approximate)  I have reviewed the triage vital signs and the nursing notes.   HISTORY  Chief Complaint Abdominal Pain    HPI Natasha Hanson is a 54 y.o. female with COPD, arthritis, chronic pain followed by the pain clinic who comes in for abdominal pain.  Patient reports having 3 days of mid abdominal pain.  She reports that the pain is severe, constant, not better with home opioids, nothing seems to make it worse.  Denies any vomiting.  Denies any diarrhea.  Last bowel movement was yesterday.  No prior surgeries on her abdomen.  Denies any chest pain does report a little bit of right shoulder pain that goes up into her neck.  She denies feeling short of breath though.  Denies any urinary symptoms.          Past Medical History:  Diagnosis Date  . Allergy   . Arthritis   . COPD (chronic obstructive pulmonary disease) (HCC)   . Glaucoma     Patient Active Problem List   Diagnosis Date Noted  . Other intervertebral disc degeneration, lumbar region 12/01/2019  . Arm numbness 10/08/2019  . Neurogenic pain 10/07/2019  . Chronic musculoskeletal pain 10/07/2019  . Chronic sacroiliac joint pain (Bilateral) 09/17/2019  . Somatic dysfunction of sacroiliac joints (Bilateral) 09/17/2019  . Other spondylosis, sacral and sacrococcygeal region 09/17/2019  . Spondylosis without myelopathy or radiculopathy, lumbosacral region 09/17/2019  . Chronic hip pain (Bilateral) 09/17/2019  . Lumbar facet hypertrophy (Multilevel) (Bilateral) 09/15/2019  . Lumbar facet syndrome (Bilateral) 09/15/2019  . Osteoarthritis of knee (Left) 09/15/2019  . Osteoarthritis of knee (Right) 09/15/2019  . Osteoarthritis of the knees (Bilateral) 09/15/2019  . Chronic pain syndrome 09/08/2019  . Pharmacologic therapy 09/08/2019  .  Disorder of skeletal system 09/08/2019  . Problems influencing health status 09/08/2019  . Cervicalgia 09/08/2019  . DDD (degenerative disc disease), cervical 09/08/2019  . Cervical facet hypertrophy 09/08/2019  . Cervical facet syndrome 09/08/2019  . Cervical central spinal stenosis 09/08/2019  . Abnormal MRI, cervical spine (02/20/2019) 09/08/2019  . Cervical foraminal stenosis 09/08/2019  . Chronic low back pain (1ry area of Pain) (Bilateral) (Midline) (L>R) w/o sciatica 09/08/2019  . Chronic knee pain (2ry area of Pain) (Bilateral) (L>R) 09/08/2019  . Chronic neck pain (3ry area of Pain) (Bilateral) (L>R) 09/08/2019  . DDD (degenerative disc disease), lumbosacral 09/08/2019  . Long term prescription opiate use 09/08/2019  . Tremor 11/12/2018    Past Surgical History:  Procedure Laterality Date  . ABDOMINAL HYSTERECTOMY    . FOOT FUSION    . FOOT SURGERY    . REFRACTIVE SURGERY      Prior to Admission medications   Medication Sig Start Date End Date Taking? Authorizing Provider  albuterol (VENTOLIN HFA) 108 (90 Base) MCG/ACT inhaler 2-4 puffs Q4 PRN SOB or tight cough 10/18/18   [provider]  gabapentin (NEURONTIN) 600 MG tablet Take 1 tablet (600 mg total) by mouth in the morning, at noon, in the evening, and at bedtime. 11/06/19 05/04/20  Delano MetzNaveira, Francisco, MD  Magnesium Oxide 500 MG CAPS Take 1 capsule (500 mg total) by mouth 2 (two) times daily at 8 am and 10 pm. 01/05/20 01/04/21  Delano MetzNaveira, Francisco, MD  meloxicam (MOBIC) 15 MG tablet Take 1 tablet (15 mg total) by mouth daily. 11/06/19 05/04/20  Delano MetzNaveira, Francisco, MD  methocarbamol (ROBAXIN) 750 MG tablet Take 1 tablet (750 mg total) by mouth 3 (three) times daily. 11/06/19 05/04/20  Delano Metz, MD  oxyCODONE (OXY IR/ROXICODONE) 5 MG immediate release tablet Take 1 tablet (5 mg total) by mouth every 8 (eight) hours as needed for severe pain. Must last 30 days. 12/11/19 01/10/20  Delano Metz, MD  oxyCODONE  (OXY IR/ROXICODONE) 5 MG immediate release tablet Take 1 tablet (5 mg total) by mouth every 8 (eight) hours as needed for severe pain. Must last 30 days. 01/10/20 02/09/20  Delano Metz, MD  oxyCODONE (OXY IR/ROXICODONE) 5 MG immediate release tablet Take 1 tablet (5 mg total) by mouth every 8 (eight) hours as needed for severe pain. Must last 30 days. 02/09/20 03/10/20  Delano Metz, MD  propranolol (INDERAL) 10 MG tablet Take 20 mg by mouth 4 (four) times daily.  08/26/19   [provider]  Turmeric 500 MG CAPS Take 500 mg by mouth daily. 01/05/20 01/04/21  Delano Metz, MD    Allergies Patient has no known allergies.  No family history on file.  Social History Social History   Tobacco Use  . Smoking status: Current Every Day Smoker  . Smokeless tobacco: Never Used  Substance Use Topics  . Alcohol use: Never  . Drug use: Not on file      Review of Systems Constitutional: No fever/chills Eyes: No visual changes. ENT: No sore throat. Cardiovascular: Denies chest pain. Respiratory: Denies shortness of breath. Gastrointestinal: Positive abdominal pain.  No nausea, no vomiting.  No diarrhea.  No constipation. Genitourinary: Negative for dysuria. Musculoskeletal: Negative for back pain.  Right shoulder pain Skin: Negative for rash. Neurological: Negative for headaches, focal weakness or numbness. All other ROS negative ____________________________________________   PHYSICAL EXAM:  VITAL SIGNS: ED Triage Vitals [12/30/19 1154]  Enc Vitals Group     BP 137/83     Pulse Rate (!) 51     Resp 16     Temp 98.8 F (37.1 C)     Temp Source Oral     SpO2 100 %     Weight 131 lb (59.4 kg)     Height 5\' 3"  (1.6 m)     Head Circumference      Peak Flow      Pain Score 10     Pain Loc      Pain Edu?      Excl. in GC?     Constitutional: Alert and oriented. Well appearing and in no acute distress. Eyes: Conjunctivae are normal. EOMI. Head:  Atraumatic. Nose: No congestion/rhinnorhea. Mouth/Throat: Mucous membranes are moist.   Neck: No stridor. Trachea Midline. FROM Cardiovascular: Normal rate, regular rhythm. Grossly normal heart sounds.  Good peripheral circulation. Respiratory: Normal respiratory effort.  No retractions. Lungs CTAB. Gastrointestinal tenderness noted in the middle abdomen, radiating to both the right upper and left upper quadrant no distention. No abdominal bruits.  Musculoskeletal: No lower extremity tenderness nor edema.  No joint effusions. Neurologic:  Normal speech and language. No gross focal neurologic deficits are appreciated.  Skin:  Skin is warm, dry and intact. No rash noted. Psychiatric: Mood and affect are normal. Speech and behavior are normal. GU: Deferred   ____________________________________________   LABS (all labs ordered are listed, but only abnormal results are displayed)  Labs Reviewed  CBC - Abnormal; Notable for the following components:      Result Value   WBC 11.5 (*)    MCH 34.1 (*)  All other components within normal limits  URINALYSIS, COMPLETE (UACMP) WITH MICROSCOPIC - Abnormal; Notable for the following components:   Color, Urine YELLOW (*)    APPearance HAZY (*)    Bilirubin Urine SMALL (*)    All other components within normal limits  LIPASE, BLOOD  COMPREHENSIVE METABOLIC PANEL   ____________________________________________   ED ECG REPORT I, Concha Se, the attending physician, personally viewed and interpreted this ECG.  Sinus bradycardia rate of 51, no ST elevation, no T wave inversions, type I AV block ____________________________________________  RADIOLOGY Vela Prose, personally viewed and evaluated these images (plain radiographs) as part of my medical decision making, as well as reviewing the written report by the radiologist.  ED MD interpretation: No pneumonia  Official radiology report(s): DG Chest 2 View  Result Date:  12/30/2019 CLINICAL DATA:  Right back and mid abdominal pain for 3 days. EXAM: CHEST - 2 VIEW COMPARISON:  Radiographs 10/16/2018. FINDINGS: The heart size and mediastinal contours are normal. The lungs are clear. There is no pleural effusion or pneumothorax. No acute osseous findings are identified. IMPRESSION: Stable chest.  No active cardiopulmonary process. Electronically Signed   By: Carey Bullocks M.D.   On: 12/30/2019 14:36   CT ABDOMEN PELVIS W CONTRAST  Result Date: 12/30/2019 CLINICAL DATA:  Abdominal distension, mid abdominal pain, leukocytosis EXAM: CT ABDOMEN AND PELVIS WITH CONTRAST TECHNIQUE: Multidetector CT imaging of the abdomen and pelvis was performed using the standard protocol following bolus administration of intravenous contrast. CONTRAST:  OMNIPAQUE IOHEXOL 300 MG/ML  SOLN COMPARISON:  01/05/2018 FINDINGS: Lower chest: The visualized lung bases are clear bilaterally. The visualized heart and pericardium are unremarkable. Hepatobiliary: There is diffusely heterogeneous enhancement of the liver, a nonspecific finding that can reflect underlying changes of geographic hepatic steatosis. There is mild hepatomegaly. Focal fatty hepatic infiltration adjacent to the falciform ligament. A poorly circumscribed region of hyperenhancement within segment 4B of the liver may represent an area of focal fatty sparing. Tiny probable cyst noted laterally within the right hepatic lobe. There is no intra or extrahepatic biliary ductal dilation. The gallbladder is unremarkable. Pancreas: Unremarkable Spleen: Unremarkable Adrenals/Urinary Tract: The adrenal glands are unremarkable tiny simple cortical cyst within the interpolar region of the left kidney. The kidneys are otherwise unremarkable. The bladder is largely decompressed. Stomach/Bowel: The stomach, large bowel, and small bowel are unremarkable. Appendix normal. No free intraperitoneal gas or fluid. Tiny fat containing umbilical hernia.  Vascular/Lymphatic: No pathologic adenopathy within the abdomen and pelvis. The abdominal vasculature is age-appropriate with mild aortoiliac atherosclerotic calcification. No aneurysm. Reproductive: Uterus absent. There is relative enlargement of the right ovary measuring 2.8 x 2.0 x 2.0 cm in greatest dimension. This is unchanged from prior examination and is likely benign given its stability over time,. Other: The rectum is unremarkable. Musculoskeletal: Degenerative changes are seen within the lumbar spine. No acute bone abnormality. IMPRESSION: No definite radiographic explanation for the patient's reported abdominal distension and abdominal pain. Incidental findings as noted above. Heterogeneous enhancement of the liver likely represents geographic fatty hepatic infiltration and areas of fatty sparing and could be confirmed with liver protocol MRI examination, if indicated. Electronically Signed   By: Helyn Numbers MD   On: 12/30/2019 15:07    ____________________________________________   PROCEDURES  Procedure(s) performed (including Critical Care):  Procedures   ____________________________________________   INITIAL IMPRESSION / ASSESSMENT AND PLAN / ED COURSE  Natasha Hanson was evaluated in Emergency Department on 12/30/2019 for the  symptoms described in the history of present illness. She was evaluated in the context of the global COVID-19 pandemic, which necessitated consideration that the patient might be at risk for infection with the SARS-CoV-2 virus that causes COVID-19. Institutional protocols and algorithms that pertain to the evaluation of patients at risk for COVID-19 are in a state of rapid change based on information released by regulatory bodies including the CDC and federal and state organizations. These policies and algorithms were followed during the patient's care in the ED.    Patient is a 54 year old with middle abdominal pain radiating into either upper abdomen.   Will get a CT scan to evaluate for perforation, obstruction, cholecystitis, pancreatitis, mass.  Pain is back up into the chest to suggest dissection.  Patient does report a little bit of right shoulder pain.  Will get EKG and cardiac markers to make sure no evidence of ACS.  Will get chest x-ray to make sure no evidence of pneumonia.  She is adamant that she not having any shortness of breath I have low suspicion for PE.  The pain in the shoulder seems separate than the pain in the abdomen.  Patient reports that she has to drive home so she declines pain medication at this time   Lipase is normal, no evidence of pancreatitis Kidney function is normal LFTs are normal White count slightly elevated 11.5 Urine without evidence of UTI  Chest x-ray negative.  Cardiac marker negative and I have low suspicion for ACS  CT scan was negative.  There was some incidental findings on her liver which I discussed with patient and provided her a copy of the report.  On repeat evaluation her pain continues to be epigastric in nature.  Patient is well-appearing otherwise.  I reviewed patient's medications and she has been on naproxen for a very long time.  I suspect that this could be causing some gastritis.  I discussed with patient starting a PPI which she states that she is already on one at home but she is not sure which one in the dose.  We also prescribed some Carafate and I recommended using Tylenol instead of the naproxen.  I given her GI follow-up.  I offered her a GI cocktail but she declined at this time.  Patient feels comfortable with this plan and will follow up with her primary care doctor if symptoms are not getting better.  I discussed the provisional nature of ED diagnosis, the treatment so far, the ongoing plan of care, follow up appointments and return precautions with the patient and any family or support people present. They expressed understanding and agreed with the plan, discharged  home.  ____________________________________________   FINAL CLINICAL IMPRESSION(S) / ED DIAGNOSES   Final diagnoses:  Acute gastritis without hemorrhage, unspecified gastritis type      MEDICATIONS GIVEN DURING THIS VISIT:  Medications  sodium chloride flush (NS) 0.9 % injection 3 mL (3 mLs Intravenous Given 12/30/19 1406)  iohexol (OMNIPAQUE) 300 MG/ML solution 100 mL (100 mLs Intravenous Contrast Given 12/30/19 1416)     ED Discharge Orders         Ordered    sucralfate (CARAFATE) 1 g tablet  3 times daily with meals & bedtime     Discontinue  Reprint     12/30/19 1532           Note:  This document was prepared using Dragon voice recognition software and may include unintentional dictation errors.   Concha Se, MD  12/30/19 1534  

## 2020-01-06 ENCOUNTER — Ambulatory Visit: Payer: Self-pay | Admitting: Pharmacy Technician

## 2020-01-06 ENCOUNTER — Other Ambulatory Visit: Payer: Self-pay

## 2020-01-06 DIAGNOSIS — Z79899 Other long term (current) drug therapy: Secondary | ICD-10-CM

## 2020-01-06 NOTE — Progress Notes (Signed)
Completed [facility] application and contract.  Patient agreed to all terms of the [facility] contract.    Patient approved to receive medication assistance at [facility] until time for re-certification in 4401, and as long as eligibility criteria continues to be met.    Provided patient with community resource material based on her particular needs.    [facility]

## 2020-01-23 NOTE — Telephone Encounter (Signed)
Thank you for your communication.  Historically this mode of transmitting information has not been an effective or reliable means of communication for, or with me. Any patient phone call or electronic communications that requires my specific input should be transferred to scheduling for an appointment.   Please assist with the care of this patient by documenting the nature of the problem using the following brief format: Problem:  Patient's Request:    When to schedule a "Face-to-Face" encounter: If the issue involves any medical complications, side-effects, or adverse reactions to any of our therapies, treatments or medications. If the patient wishes to adjust or change any controlled substances, or if the patient has any questions about this therapy. Pain has worsened, not improve, or the patient is experiencing a new problem. All of these will require an exam.  When to schedule a "Virtual Visit" encounter:  When the patient has specific questions that could not be answered by the nursing staff and it does not involve any of the reasons requiring a face-to-face encounter.

## 2020-02-02 ENCOUNTER — Telehealth: Payer: Self-pay | Admitting: Pharmacist

## 2020-02-02 NOTE — Telephone Encounter (Signed)
02/02/2020 9:42:05 AM - Pristiq to provider & patient  -- Natasha Hanson - Monday, February 02, 2020 9:40 AM --Received pharmacy printout for Pristiq ER 25mg  Take one tablet by mouth every day, # 90. patient her portion and provider their portion to sign & return.

## 2020-02-19 ENCOUNTER — Ambulatory Visit
Admission: RE | Admit: 2020-02-19 | Discharge: 2020-02-19 | Disposition: A | Discharge status: Home / Self Care | Payer: Self-pay | Attending: Physician Assistant | Admitting: Physician Assistant

## 2020-02-19 ENCOUNTER — Other Ambulatory Visit: Payer: Self-pay | Admitting: Physician Assistant

## 2020-02-19 ENCOUNTER — Ambulatory Visit
Admission: RE | Admit: 2020-02-19 | Discharge: 2020-02-19 | Disposition: A | Discharge status: Home / Self Care | Payer: Self-pay | Source: Ambulatory Visit | Attending: Physician Assistant | Admitting: Physician Assistant

## 2020-02-19 DIAGNOSIS — R05 Cough: Secondary | ICD-10-CM | POA: Insufficient documentation

## 2020-02-19 DIAGNOSIS — R059 Cough, unspecified: Secondary | ICD-10-CM

## 2020-02-23 ENCOUNTER — Other Ambulatory Visit: Payer: Self-pay | Admitting: Physician Assistant

## 2020-02-23 DIAGNOSIS — J45901 Unspecified asthma with (acute) exacerbation: Secondary | ICD-10-CM

## 2020-02-23 DIAGNOSIS — R0989 Other specified symptoms and signs involving the circulatory and respiratory systems: Secondary | ICD-10-CM

## 2020-02-23 DIAGNOSIS — R053 Chronic cough: Secondary | ICD-10-CM

## 2020-03-08 ENCOUNTER — Encounter: Payer: Self-pay | Admitting: Pain Medicine

## 2020-03-11 ENCOUNTER — Other Ambulatory Visit: Payer: Self-pay

## 2020-03-11 ENCOUNTER — Encounter: Payer: Self-pay | Admitting: Anesthesiology

## 2020-03-11 ENCOUNTER — Ambulatory Visit: Payer: Self-pay | Attending: Pain Medicine | Admitting: Anesthesiology

## 2020-03-11 DIAGNOSIS — G894 Chronic pain syndrome: Secondary | ICD-10-CM

## 2020-03-11 DIAGNOSIS — M7918 Myalgia, other site: Secondary | ICD-10-CM

## 2020-03-11 DIAGNOSIS — M25562 Pain in left knee: Secondary | ICD-10-CM

## 2020-03-11 DIAGNOSIS — Z79899 Other long term (current) drug therapy: Secondary | ICD-10-CM

## 2020-03-11 DIAGNOSIS — M47816 Spondylosis without myelopathy or radiculopathy, lumbar region: Secondary | ICD-10-CM

## 2020-03-11 DIAGNOSIS — Z79891 Long term (current) use of opiate analgesic: Secondary | ICD-10-CM

## 2020-03-11 DIAGNOSIS — M25561 Pain in right knee: Secondary | ICD-10-CM

## 2020-03-11 DIAGNOSIS — M542 Cervicalgia: Secondary | ICD-10-CM

## 2020-03-11 DIAGNOSIS — M545 Low back pain: Secondary | ICD-10-CM

## 2020-03-11 DIAGNOSIS — G8929 Other chronic pain: Secondary | ICD-10-CM

## 2020-03-11 MED ORDER — OXYCODONE HCL 5 MG PO TABS: 5.0000 mg | ORAL_TABLET | Freq: Three times a day (TID) | ORAL | 0 refills | Status: DC | PRN

## 2020-03-11 NOTE — Progress Notes (Signed)
Virtual Visit via Telephone Note  I connected with Natasha Hanson on 03/11/20 at  1:40 PM EDT by telephone and verified that I am speaking with the correct person using two identifiers.  Location: Patient: Home Provider: [facility] control center   I discussed the limitations, risks, security and privacy concerns of performing an evaluation and management service by telephone and the availability of in person appointments. I also discussed with the patient that there may be a patient responsible charge related to this service. The patient expressed understanding and agreed to proceed.   History of Present Illness: I spoke with Natasha Hanson via telephone as we were unable to link up for the video portion of the virtual conference.  She reports that she is doing well with her chronic opioid maintenance therapy.  She is taking oxycodone 500 tablets 3 times a day and this is working well for her.  She is due for refill tomorrow morning.  She states that the quality of her pain has been stable in nature.  It primarily affects her low back with radiation into the lower legs and occasionally her knees.  She has some intermittent neck pain but this is less troubling for her at present.  She takes her medications and gets good relief with these lasting approximate 4 to 6 hours before she has some recurrence of the same quality pain.  No side effects reported with the opioid therapy.  This is been doing well for her serious ports.  No change in lower extremity strength or function or bowel bladder function is noted.  Otherwise she is in her usual state of health.    Observations/Objective:  Current Outpatient Medications:  .  albuterol (VENTOLIN HFA) 108 (90 Base) MCG/ACT inhaler, 2-4 puffs Q4 PRN SOB or tight cough, Disp: , Rfl:  .  gabapentin (NEURONTIN) 600 MG tablet, Take 1 tablet (600 mg total) by mouth in the morning, at noon, in the evening, and at bedtime., Disp: 120 tablet, Rfl: 5 .  Magnesium Oxide 500  MG CAPS, Take 1 capsule (500 mg total) by mouth 2 (two) times daily at 8 am and 10 pm., Disp: 180 capsule, Rfl: 3 .  meloxicam (MOBIC) 15 MG tablet, Take 1 tablet (15 mg total) by mouth daily., Disp: 90 tablet, Rfl: 1 .  methocarbamol (ROBAXIN) 750 MG tablet, Take 1 tablet (750 mg total) by mouth 3 (three) times daily., Disp: 90 tablet, Rfl: 5 .  oxyCODONE (OXY IR/ROXICODONE) 5 MG immediate release tablet, Take 1 tablet (5 mg total) by mouth every 8 (eight) hours as needed for severe pain. Must last 30 days., Disp: 90 tablet, Rfl: 0 .  [START ON 03/12/2020] oxyCODONE (OXY IR/ROXICODONE) 5 MG immediate release tablet, Take 1 tablet (5 mg total) by mouth every 8 (eight) hours as needed for severe pain. Must last 30 days., Disp: 90 tablet, Rfl: 0 .  [START ON 04/11/2020] oxyCODONE (OXY IR/ROXICODONE) 5 MG immediate release tablet, Take 1 tablet (5 mg total) by mouth every 8 (eight) hours as needed for severe pain. Must last 30 days., Disp: 90 tablet, Rfl: 0 .  propranolol (INDERAL) 10 MG tablet, Take 20 mg by mouth 4 (four) times daily. , Disp: , Rfl:  .  sucralfate (CARAFATE) 1 g tablet, Take 1 tablet (1 g total) by mouth 4 (four) times daily -  with meals and at bedtime., Disp: 120 tablet, Rfl: 0 .  Turmeric 500 MG CAPS, Take 500 mg by mouth daily., Disp: 90 capsule,  Rfl: 3  Assessment and Plan:  1. Chronic low back pain (1ry area of Pain) (Bilateral) (Midline) (L>R) w/o sciatica   2. Chronic pain syndrome   3. Chronic knee pain (2ry area of Pain) (Bilateral) (L>R)   4. Pharmacologic therapy   5. Chronic musculoskeletal pain   6. Lumbar facet syndrome (Bilateral)   7. Chronic neck pain (3ry area of Pain) (Bilateral) (L>R)   8. Long term prescription opiate use   Based on our discussion today and upon review of the [place] practitioner database information going to refill medications for the next 2 months.  I will schedule her for return to clinic evaluation with Dr. Laban Emperor.  I have reviewed  her recent UDS and it was appropriate.  No changes in her pain management protocol will otherwise be initiated today.  I have encouraged her to continue with ambulation and exercise core stretching and strengthening.  Also encouraged her to continue follow-up with her primary care physicians for baseline medical care. Follow Up Instructions:    I discussed the assessment and treatment plan with the patient. The patient was provided an opportunity to ask questions and all were answered. The patient agreed with the plan and demonstrated an understanding of the instructions.   The patient was advised to call back or seek an in-person evaluation if the symptoms worsen or if the condition fails to improve as anticipated.  I provided 25 minutes of non-face-to-face time during this encounter.   [name], MD

## 2020-03-20 ENCOUNTER — Other Ambulatory Visit: Payer: Self-pay

## 2020-04-01 ENCOUNTER — Other Ambulatory Visit: Payer: Self-pay | Admitting: Physician Assistant

## 2020-04-08 ENCOUNTER — Other Ambulatory Visit: Payer: Self-pay | Admitting: Neurology

## 2020-04-15 ENCOUNTER — Telehealth: Payer: Self-pay

## 2020-04-15 NOTE — Telephone Encounter (Signed)
Called patient to administer the social determinants of health.  Patient had a lot of red areas and social work intern informed her that she would call back after she could research resources for home repair, [employer], utilities, and property taxes next week sometime.

## 2020-04-20 ENCOUNTER — Ambulatory Visit: Payer: Self-pay | Admitting: Gerontology

## 2020-04-20 NOTE — Telephone Encounter (Signed)
Social work Tax inspector called patient and informed her that she was unable to find [employer] for patients needs.  Informed patient of 211 resource line and asked her if she wanted me to send her their resource sheet.  Patient did desire sheet and social work Tax inspector put the sheet in outgoing mail the same day.

## 2020-05-03 ENCOUNTER — Other Ambulatory Visit: Payer: Self-pay | Admitting: Physician Assistant

## 2020-05-09 NOTE — Progress Notes (Signed)
PROVIDER NOTE: Information contained herein reflects review and annotations entered in association with encounter. Interpretation of such information and data should be left to medically-trained personnel. Information provided to patient can be located elsewhere in the medical record under "Patient Instructions". Document created using STT-dictation technology, any transcriptional errors that may result from process are unintentional.    Patient: Natasha Hanson  Service Category: E/M  Provider: Gaspar Cola, MD  DOB: 10/29/1965  DOS: 05/10/2020  Specialty: Interventional Pain Management  MRN: [id number]  Setting: Ambulatory outpatient  PCP: No primary care provider on file.  Type: Established Patient    Referring Provider: No ref. provider found  Location: Office  Delivery: Face-to-face     HPI  [name], a 54 y.o. year old female, is here today because of her Chronic pain syndrome [G89.4]. [name] primary complain today is Back Pain Last encounter: My last encounter with her was on 12/11/2019. Pertinent problems: [name] has Chronic pain syndrome; Cervicalgia; DDD (degenerative disc disease), cervical; Cervical facet hypertrophy; Cervical facet syndrome; Cervical central spinal stenosis; Abnormal MRI, cervical spine (02/20/2019); Cervical foraminal stenosis; Chronic low back pain (1ry area of Pain) (Bilateral) (Midline) (L>R) w/o sciatica; Chronic knee pain (2ry area of Pain) (Bilateral) (L>R); Chronic neck pain (3ry area of Pain) (Bilateral) (L>R); DDD (degenerative disc disease), lumbosacral; Lumbar facet hypertrophy (Multilevel) (Bilateral); Lumbar facet syndrome (Bilateral); Osteoarthritis of knee (Left); Osteoarthritis of knee (Right); Osteoarthritis of the knees (Bilateral); Chronic sacroiliac joint pain (Bilateral); Somatic dysfunction of sacroiliac joints (Bilateral); Other spondylosis, sacral and sacrococcygeal region; Spondylosis without myelopathy or radiculopathy,  lumbosacral region; Chronic hip pain (Bilateral); Neurogenic pain; Chronic musculoskeletal pain; Arm numbness; and Other intervertebral disc degeneration, lumbar region on their pertinent problem list. Pain Assessment: Severity of Chronic pain is reported as a 8 /10. Location: Back Lower/Denies. Onset: More than a month ago. Quality: Aching, Burning, Constant, Discomfort, Throbbing. Timing: Constant. Modifying factor(s): meds and laying down. Vitals:  height is 5' 3"  (1.6 m) and weight is 131 lb (59.4 kg). Her temperature is 97.9 F (36.6 C). Her blood pressure is 110/67 and her pulse is 74. Her oxygen saturation is 100%.   Reason for encounter: medication management.  The patient indicates doing well with the current medication regimen. No adverse reactions or side effects reported to the medications. PMP & UDS compliant.   RTCB: 07/10/2020 Nonopioids transferred 05/10/2020: Magnesium, Robaxin, Neurontin, turmeric, and Mobic.  Pharmacotherapy Assessment   Analgesic: Oxycodone IR 5 mg every 8 hours (15 mg/day of oxycodone) Highest recorded MME/day: 30 mg/day MME/day: 22.5 mg/day   Monitoring: [place] PMP: PDMP reviewed during this encounter.       Pharmacotherapy: No side-effects or adverse reactions reported. Compliance: No problems identified. Effectiveness: Clinically acceptable.  Chauncey Fischer, RN  05/10/2020 11:11 AM  Sign when Signing Visit Nursing Pain Medication Assessment:  Safety precautions to be maintained throughout the outpatient stay will include: orient to surroundings, keep bed in low position, maintain call bell within reach at all times, provide assistance with transfer out of bed and ambulation.  Medication Inspection Compliance: [name] did not comply with our request to bring her pills to be counted. She was reminded that bringing the medication bottles, even when empty, is a requirement.  Medication: None brought in. Pill/Patch Count: None available to be  counted. Bottle Appearance: No container available. Did not bring bottle(s) to appointment. Filled Date: N/A Last Medication intake:  TodaySafety precautions to be maintained throughout the outpatient stay  will include: orient to surroundings, keep bed in low position, maintain call bell within reach at all times, provide assistance with transfer out of bed and ambulation.     UDS:  Summary  Date Value Ref Range Status  11/06/2019 Note  Final    Comment:    ==================================================================== [employer] (MW) ==================================================================== Test                             Result       Flag       Units Drug Present and Declared for Prescription Verification   Oxycodone                      942          EXPECTED   ng/mg creat   Oxymorphone                    1903         EXPECTED   ng/mg creat   Noroxycodone                   3335         EXPECTED   ng/mg creat   Noroxymorphone                 1689         EXPECTED   ng/mg creat    Sources of oxycodone are scheduled prescription medications.    Oxymorphone, noroxycodone, and noroxymorphone are expected    metabolites of oxycodone. Oxymorphone is also available as a    scheduled prescription medication. ==================================================================== Test                      Result    Flag   Units      Ref Range   Creatinine              117              mg/dL      >=20 ==================================================================== Declared Medications:  The flagging and interpretation on this report are based on the  following declared medications.  Unexpected results may arise from  inaccuracies in the declared medications.  **Note: The testing scope of this panel includes these medications:  Oxycodone  **Note: The testing scope of this panel does not include the  following reported medications:  Albuterol (Ventolin HFA)   Gabapentin (Neurontin)  Magnesium  Meloxicam (Mobic)  Methocarbamol (Robaxin)  Propranolol (Inderal)  Turmeric ==================================================================== For clinical consultation, please call [phone]. ====================================================================      ROS  Constitutional: Denies any fever or chills Gastrointestinal: No reported hemesis, hematochezia, vomiting, or acute GI distress Musculoskeletal: Denies any acute onset joint swelling, redness, loss of ROM, or weakness Neurological: No reported episodes of acute onset apraxia, aphasia, dysarthria, agnosia, amnesia, paralysis, loss of coordination, or loss of consciousness  Medication Review  Magnesium Oxide, Turmeric, albuterol, gabapentin, meloxicam, methocarbamol, oxyCODONE, propranolol, and sucralfate  History Review  Allergy: Ms. Majer has No Known Allergies. Drug: Ms. Kahler  has no history on file for drug use. Alcohol:  reports no history of alcohol use. Tobacco:  reports that she has been smoking. She has never used smokeless tobacco. Social: Ms. Kingma  reports that she has been smoking. She has never used smokeless tobacco. She reports that she does not drink alcohol. Medical:  has a past medical  history of Allergy, Arthritis, COPD (chronic obstructive pulmonary disease) ([place]), and Glaucoma. Surgical: Ms. Philemon  has a past surgical history that includes Refractive surgery; Abdominal hysterectomy; Foot surgery; and Foot Fusion. Family: family history is not on file.  Laboratory Chemistry Profile   Renal Lab Results  Component Value Date   BUN 15 12/30/2019   CREATININE 0.73 12/30/2019   BCR 12 09/09/2019   GFRAA >60 12/30/2019   GFRNONAA >60 12/30/2019     Hepatic Lab Results  Component Value Date   AST 15 12/30/2019   ALT 13 12/30/2019   ALBUMIN 3.9 12/30/2019   ALKPHOS 67 12/30/2019   LIPASE 20 12/30/2019     Electrolytes Lab Results  Component  Value Date   NA 138 12/30/2019   K 3.9 12/30/2019   CL 105 12/30/2019   CALCIUM 8.9 12/30/2019   MG 1.8 09/09/2019     Bone Lab Results  Component Value Date   25OHVITD1 33 09/09/2019   25OHVITD2 <1.0 09/09/2019   25OHVITD3 33 09/09/2019     Inflammation (CRP: Acute Phase) (ESR: Chronic Phase) Lab Results  Component Value Date   CRP 2 09/09/2019   ESRSEDRATE 7 09/09/2019       Note: Above Lab results reviewed.  Recent Imaging Review  DG Chest 2 View CLINICAL DATA:  Productive cough for several weeks  EXAM: CHEST - 2 VIEW  COMPARISON:  12/30/2019  FINDINGS: Lungs are mildly hyperinflated. No focal infiltrate or sizable effusion is seen. No bony abnormality is noted. Cardiac shadow is within normal limits. Aortic calcifications are seen.  IMPRESSION: COPD without acute abnormality.  Electronically Signed   By: Inez Catalina M.D.   On: 02/20/2020 07:55 Note: Reviewed        Physical Exam  General appearance: Well nourished, well developed, and well hydrated. In no apparent acute distress Mental status: Alert, oriented x 3 (person, place, & time)       Respiratory: No evidence of acute respiratory distress Eyes: PERLA Vitals: BP 110/67   Pulse 74   Temp 97.9 F (36.6 C)   Ht 5' 3"  (1.6 m)   Wt 131 lb (59.4 kg)   SpO2 100%   BMI 23.21 kg/m  BMI: Estimated body mass index is 23.21 kg/m as calculated from the following:   Height as of this encounter: 5' 3"  (1.6 m).   Weight as of this encounter: 131 lb (59.4 kg). Ideal: Ideal body weight: 52.4 kg (115 lb 8.3 oz) Adjusted ideal body weight: 55.2 kg (121 lb 11.4 oz)  Assessment   Status Diagnosis  Controlled Controlled Controlled 1. Chronic pain syndrome   2. Chronic low back pain (1ry area of Pain) (Bilateral) (Midline) (L>R) w/o sciatica   3. Chronic knee pain (2ry area of Pain) (Bilateral) (L>R)   4. Lumbar facet syndrome (Bilateral)   5. Chronic neck pain (3ry area of Pain) (Bilateral) (L>R)   6.  Pharmacologic therapy   7. Chronic musculoskeletal pain   8. Neurogenic pain   9. Osteoarthritis of the knees (Bilateral)      Updated Problems: No problems updated.  Plan of Care  Problem-specific:  No problem-specific Assessment & Plan notes found for this encounter.  Ms. CHARLESETTA MILLIRON has a current medication list which includes the following long-term medication(s): albuterol, gabapentin, magnesium oxide, meloxicam, methocarbamol, [START ON 05/11/2020] oxycodone, [START ON 06/10/2020] oxycodone, [START ON 07/10/2020] oxycodone, propranolol, sucralfate, and turmeric.  Pharmacotherapy (Medications Ordered): Meds ordered this encounter  Medications  . Magnesium Oxide  500 MG CAPS    Sig: Take 1 capsule (500 mg total) by mouth 2 (two) times daily at 8 am and 10 pm.    Dispense:  60 capsule    Refill:  2    Fill one day early if pharmacy is closed on scheduled refill date. Generic permitted. Do not send renewal requests. Void any older duplicate prescription or refill(s) that may be on file.  . methocarbamol (ROBAXIN) 750 MG tablet    Sig: Take 1 tablet (750 mg total) by mouth 3 (three) times daily.    Dispense:  90 tablet    Refill:  2    Fill one day early if pharmacy is closed on scheduled refill date. Generic permitted. Do not send renewal requests. Void any older duplicate prescription or refill(s) that may be on file.  . gabapentin (NEURONTIN) 600 MG tablet    Sig: Take 1 tablet (600 mg total) by mouth in the morning, at noon, in the evening, and at bedtime.    Dispense:  120 tablet    Refill:  2    Fill one day early if pharmacy is closed on scheduled refill date. Generic permitted. Do not send renewal requests. Void any older duplicate prescription or refill(s) that may be on file.  . Turmeric 500 MG CAPS    Sig: Take 500 mg by mouth daily.    Dispense:  30 capsule    Refill:  2    Fill one day early if pharmacy is closed on scheduled refill date. Generic permitted. Do not  send renewal requests. Void any older duplicate prescription or refill(s) that may be on file.  . meloxicam (MOBIC) 15 MG tablet    Sig: Take 1 tablet (15 mg total) by mouth daily.    Dispense:  30 tablet    Refill:  2    Fill one day early if pharmacy is closed on scheduled refill date. Generic permitted. Do not send renewal requests. Void any older duplicate prescription or refill(s) that may be on file.  Marland Kitchen oxyCODONE (OXY IR/ROXICODONE) 5 MG immediate release tablet    Sig: Take 1 tablet (5 mg total) by mouth every 8 (eight) hours as needed for severe pain. Must last 30 days.    Dispense:  90 tablet    Refill:  0    Chronic Pain: STOP Act (Not applicable) Fill 1 day early if closed on refill date.  Avoid benzodiazepines within 8 hours of opioids  . oxyCODONE (OXY IR/ROXICODONE) 5 MG immediate release tablet    Sig: Take 1 tablet (5 mg total) by mouth every 8 (eight) hours as needed for severe pain. Must last 30 days.    Dispense:  90 tablet    Refill:  0    Chronic Pain: STOP Act (Not applicable) Fill 1 day early if closed on refill date.  Avoid benzodiazepines within 8 hours of opioids  . oxyCODONE (OXY IR/ROXICODONE) 5 MG immediate release tablet    Sig: Take 1 tablet (5 mg total) by mouth every 8 (eight) hours as needed for severe pain. Must last 30 days.    Dispense:  90 tablet    Refill:  0    Chronic Pain: STOP Act (Not applicable) Fill 1 day early if closed on refill date.  Avoid benzodiazepines within 8 hours of opioids   Orders:  No orders of the defined types were placed in this encounter.  Follow-up plan:   Return in about 3 months (around 08/09/2020) for (  F2F), (Med Mgmt).      Interventional management options: Planned, scheduled, and/or pending:    Diagnostic bilateral lumbar facet block #1    Considering:   Diagnostic left lumbar ESI  Diagnostic bilateral lumbar facet block  Possible bilateral lumbar facet RFA  Diagnostic bilateral intra-articular knee joint  injection  Possible bilateral Hyalgan knee injections  Diagnostic bilateral genicular nerve block  Possible bilateral genicular nerve RFA  Diagnostic left cervical ESI  Diagnostic bilateral cervical facet block  Possible bilateral cervical facet RFA    PRN Procedures:   None at this time      Recent Visits Date Type Provider Dept  03/11/20 Telemedicine Molli Barrows, MD [facility]  Showing recent visits within past 90 days and meeting all other requirements Today's Visits Date Type Provider Dept  05/10/20 Office Visit Milinda Pointer, MD [facility]  Showing today's visits and meeting all other requirements Future Appointments No visits were found meeting these conditions. Showing future appointments within next 90 days and meeting all other requirements  I discussed the assessment and treatment plan with the patient. The patient was provided an opportunity to ask questions and all were answered. The patient agreed with the plan and demonstrated an understanding of the instructions.  Patient advised to call back or seek an in-person evaluation if the symptoms or condition worsens.  Duration of encounter: 37 minutes.  Note by: Gaspar Cola, MD Date: 05/10/2020; Time: 12:31 PM

## 2020-05-10 ENCOUNTER — Ambulatory Visit: Payer: Self-pay | Attending: Pain Medicine | Admitting: Pain Medicine

## 2020-05-10 ENCOUNTER — Encounter: Payer: Self-pay | Admitting: Pain Medicine

## 2020-05-10 ENCOUNTER — Other Ambulatory Visit: Payer: Self-pay

## 2020-05-10 VITALS — BP 110/67 | HR 74 | Temp 97.9°F | Ht 63.0 in | Wt 131.0 lb

## 2020-05-10 DIAGNOSIS — M7918 Myalgia, other site: Secondary | ICD-10-CM | POA: Insufficient documentation

## 2020-05-10 DIAGNOSIS — M25561 Pain in right knee: Secondary | ICD-10-CM | POA: Insufficient documentation

## 2020-05-10 DIAGNOSIS — M170 Bilateral primary osteoarthritis of knee: Secondary | ICD-10-CM | POA: Insufficient documentation

## 2020-05-10 DIAGNOSIS — Z79899 Other long term (current) drug therapy: Secondary | ICD-10-CM | POA: Insufficient documentation

## 2020-05-10 DIAGNOSIS — G8929 Other chronic pain: Secondary | ICD-10-CM | POA: Insufficient documentation

## 2020-05-10 DIAGNOSIS — M47816 Spondylosis without myelopathy or radiculopathy, lumbar region: Secondary | ICD-10-CM | POA: Insufficient documentation

## 2020-05-10 DIAGNOSIS — M545 Low back pain, unspecified: Secondary | ICD-10-CM | POA: Insufficient documentation

## 2020-05-10 DIAGNOSIS — M542 Cervicalgia: Secondary | ICD-10-CM | POA: Insufficient documentation

## 2020-05-10 DIAGNOSIS — M792 Neuralgia and neuritis, unspecified: Secondary | ICD-10-CM | POA: Insufficient documentation

## 2020-05-10 DIAGNOSIS — G894 Chronic pain syndrome: Secondary | ICD-10-CM | POA: Insufficient documentation

## 2020-05-10 DIAGNOSIS — M25562 Pain in left knee: Secondary | ICD-10-CM | POA: Insufficient documentation

## 2020-05-10 MED ORDER — OXYCODONE HCL 5 MG PO TABS: 5.0000 mg | ORAL_TABLET | Freq: Three times a day (TID) | ORAL | 0 refills | Status: DC | PRN

## 2020-05-10 MED ORDER — MAGNESIUM OXIDE -MG SUPPLEMENT 500 MG PO CAPS: 1.0000 | ORAL_CAPSULE | Freq: Two times a day (BID) | ORAL | 2 refills | Status: DC

## 2020-05-10 MED ORDER — METHOCARBAMOL 750 MG PO TABS: 750.0000 mg | ORAL_TABLET | Freq: Three times a day (TID) | ORAL | 2 refills | Status: DC

## 2020-05-10 MED ORDER — TURMERIC 500 MG PO CAPS: 500.0000 mg | ORAL_CAPSULE | Freq: Every day | ORAL | 2 refills | Status: DC

## 2020-05-10 MED ORDER — GABAPENTIN 600 MG PO TABS: 600.0000 mg | ORAL_TABLET | Freq: Four times a day (QID) | ORAL | 2 refills | Status: DC

## 2020-05-10 MED ORDER — MELOXICAM 15 MG PO TABS: 15.0000 mg | ORAL_TABLET | Freq: Every day | ORAL | 2 refills | Status: DC

## 2020-05-10 NOTE — Progress Notes (Signed)
Nursing Pain Medication Assessment:  Safety precautions to be maintained throughout the outpatient stay will include: orient to surroundings, keep bed in low position, maintain call bell within reach at all times, provide assistance with transfer out of bed and ambulation.  Medication Inspection Compliance: Natasha Hanson did not comply with our request to bring her pills to be counted. She was reminded that bringing the medication bottles, even when empty, is a requirement.  Medication: None brought in. Pill/Patch Count: None available to be counted. Bottle Appearance: No container available. Did not bring bottle(s) to appointment. Filled Date: N/A Last Medication intake:  TodaySafety precautions to be maintained throughout the outpatient stay will include: orient to surroundings, keep bed in low position, maintain call bell within reach at all times, provide assistance with transfer out of bed and ambulation.

## 2020-05-10 NOTE — Patient Instructions (Signed)
____________________________________________________________________________________________  Medication Rules  Purpose: To inform patients, and their family members, of our rules and regulations.  Applies to: All patients receiving prescriptions (written or electronic).  Pharmacy of record: Pharmacy where electronic prescriptions will be sent. If written prescriptions are taken to a different pharmacy, please inform the nursing staff. The pharmacy listed in the electronic medical record should be the one where you would like electronic prescriptions to be sent.  Electronic prescriptions: In compliance with the [place] Strengthen Opioid Misuse Prevention (STOP) Act of 2017 (Session Law 2017-74/H243), effective June 12, 2018, all controlled substances must be electronically prescribed. Calling prescriptions to the pharmacy will cease to exist.  Prescription refills: Only during scheduled appointments. Applies to all prescriptions.  NOTE: The following applies primarily to controlled substances (Opioid* Pain Medications).   Type of encounter (visit): For patients receiving controlled substances, face-to-face visits are required. (Not an option or up to the patient.)  Patient's responsibilities: 1. Pain Pills: Bring all pain pills to every appointment (except for procedure appointments). 2. Pill Bottles: Bring pills in original pharmacy bottle. Always bring the newest bottle. Bring bottle, even if empty. 3. Medication refills: You are responsible for knowing and keeping track of what medications you take and those you need refilled. The day before your appointment: write a list of all prescriptions that need to be refilled. The day of the appointment: give the list to the admitting nurse. Prescriptions will be written only during appointments. No prescriptions will be written on procedure days. If you forget a medication: it will not be "Called in", "Faxed", or "electronically sent".  You will need to get another appointment to get these prescribed. No early refills. Do not call asking to have your prescription filled early. 4. Prescription Accuracy: You are responsible for carefully inspecting your prescriptions before leaving our office. Have the discharge nurse carefully go over each prescription with you, before taking them home. Make sure that your name is accurately spelled, that your address is correct. Check the name and dose of your medication to make sure it is accurate. Check the number of pills, and the written instructions to make sure they are clear and accurate. Make sure that you are given enough medication to last until your next medication refill appointment. 5. Taking Medication: Take medication as prescribed. When it comes to controlled substances, taking less pills or less frequently than prescribed is permitted and encouraged. Never take more pills than instructed. Never take medication more frequently than prescribed.  6. Inform other Doctors: Always inform, all of your healthcare providers, of all the medications you take. 7. Pain Medication from other Providers: You are not allowed to accept any additional pain medication from any other Doctor or Healthcare provider. There are two exceptions to this rule. (see below) In the event that you require additional pain medication, you are responsible for notifying us, as stated below. 8. Cough Medicine: Often these contain an opioid, such as codeine or hydrocodone. Never accept or take cough medicine containing these opioids if you are already taking an opioid* medication. The combination may cause respiratory failure and death. 9. Medication Agreement: You are responsible for carefully reading and following our Medication Agreement. This must be signed before receiving any prescriptions from our practice. Safely store a copy of your signed Agreement. Violations to the Agreement will result in no further prescriptions.  (Additional copies of our Medication Agreement are available upon request.) 10. Laws, Rules, & Regulations: All patients are expected to follow all   Federal and State Laws, Statutes, Rules, & Regulations. Ignorance of the Laws does not constitute a valid excuse.  11. Illegal drugs and Controlled Substances: The use of illegal substances (including, but not limited to marijuana and its derivatives) and/or the illegal use of any controlled substances is strictly prohibited. Violation of this rule may result in the immediate and permanent discontinuation of any and all prescriptions being written by our practice. The use of any illegal substances is prohibited. 12. Adopted CDC guidelines & recommendations: Target dosing levels will be at or below 60 MME/day. Use of benzodiazepines** is not recommended.  Exceptions: There are only two exceptions to the rule of not receiving pain medications from other Healthcare Providers. 1. Exception #1 (Emergencies): In the event of an emergency (i.e.: accident requiring emergency care), you are allowed to receive additional pain medication. However, you are responsible for: As soon as you are able, call our office [phone], at any time of the day or night, and leave a message stating your name, the date and nature of the emergency, and the name and dose of the medication prescribed. In the event that your call is answered by a member of our staff, make sure to document and save the date, time, and the name of the person that took your information.  2. Exception #2 (Planned Surgery): In the event that you are scheduled by another doctor or dentist to have any type of surgery or procedure, you are allowed (for a period no longer than 30 days), to receive additional pain medication, for the acute post-op pain. However, in this case, you are responsible for picking up a copy of our "Post-op Pain Management for Surgeons" handout, and giving it to your surgeon or dentist. This  document is available at our office, and does not require an appointment to obtain it. Simply go to our office during business hours (Monday-Thursday from 8:00 AM to 4:00 PM) (Friday 8:00 AM to 12:00 Noon) or if you have a scheduled appointment with us, prior to your surgery, and ask for it by name. In addition, you are responsible for: calling our office [phone], at any time of the day or night, and leaving a message stating your name, name of your surgeon, type of surgery, and date of procedure or surgery. Failure to comply with your responsibilities may result in termination of therapy involving the controlled substances.  *Opioid medications include: morphine, codeine, oxycodone, oxymorphone, hydrocodone, hydromorphone, meperidine, tramadol, tapentadol, buprenorphine, fentanyl, methadone. **Benzodiazepine medications include: diazepam (Valium), alprazolam (Xanax), clonazepam (Klonopine), lorazepam (Ativan), clorazepate (Tranxene), chlordiazepoxide (Librium), estazolam (Prosom), oxazepam (Serax), temazepam (Restoril), triazolam (Halcion) (Last updated: 05/10/2020) ____________________________________________________________________________________________   ____________________________________________________________________________________________  Medication Recommendations and Reminders  Applies to: All patients receiving prescriptions (written and/or electronic).  Medication Rules & Regulations: These rules and regulations exist for your safety and that of others. They are not flexible and neither are we. Dismissing or ignoring them will be considered "non-compliance" with medication therapy, resulting in complete and irreversible termination of such therapy. (See document titled "Medication Rules" for more details.) In all conscience, because of safety reasons, we cannot continue providing a therapy where the patient does not follow instructions.  Pharmacy of record:   Definition:  This is the pharmacy where your electronic prescriptions will be sent.   We do not endorse any particular pharmacy, however, we have experienced problems with [facility] not securing enough medication supply for the community.  We do not restrict you in your choice of pharmacy. However,   once we write for your prescriptions, we will NOT be re-sending more prescriptions to fix restricted supply problems created by your pharmacy, or your insurance.   The pharmacy listed in the electronic medical record should be the one where you want electronic prescriptions to be sent.  If you choose to change pharmacy, simply notify our nursing staff.  Recommendations:  Keep all of your pain medications in a safe place, under lock and key, even if you live alone. We will NOT replace lost, stolen, or damaged medication.  After you fill your prescription, take 1 week's worth of pills and put them away in a safe place. You should keep a separate, properly labeled bottle for this purpose. The remainder should be kept in the original bottle. Use this as your primary supply, until it runs out. Once it's gone, then you know that you have 1 week's worth of medicine, and it is time to come in for a prescription refill. If you do this correctly, it is unlikely that you will ever run out of medicine.  To make sure that the above recommendation works, it is very important that you make sure your medication refill appointments are scheduled at least 1 week before you run out of medicine. To do this in an effective manner, make sure that you do not leave the office without scheduling your next medication management appointment. Always ask the nursing staff to show you in your prescription , when your medication will be running out. Then arrange for the receptionist to get you a return appointment, at least 7 days before you run out of medicine. Do not wait until you have 1 or 2 pills left, to come in. This is very poor planning and  does not take into consideration that we may need to cancel appointments due to bad weather, sickness, or emergencies affecting our staff.  DO NOT ACCEPT A "Partial Fill": If for any reason your pharmacy does not have enough pills/tablets to completely fill or refill your prescription, do not allow for a "partial fill". The law allows the pharmacy to complete that prescription within 72 hours, without requiring a new prescription. If they do not fill the rest of your prescription within those 72 hours, you will need a separate prescription to fill the remaining amount, which we will NOT provide. If the reason for the partial fill is your insurance, you will need to talk to the pharmacist about payment alternatives for the remaining tablets, but again, DO NOT ACCEPT A PARTIAL FILL, unless you can trust your pharmacist to obtain the remainder of the pills within 72 hours.  Prescription refills and/or changes in medication(s):   Prescription refills, and/or changes in dose or medication, will be conducted only during scheduled medication management appointments. (Applies to both, written and electronic prescriptions.)  No refills on procedure days. No medication will be changed or started on procedure days. No changes, adjustments, and/or refills will be conducted on a procedure day. Doing so will interfere with the diagnostic portion of the procedure.  No phone refills. No medications will be "called into the pharmacy".  No Fax refills.  No weekend refills.  No Holliday refills.  No after hours refills.  Remember:  Business hours are:  Monday to Thursday 8:00 AM to 4:00 PM Provider's Schedule: Skylinn Vialpando, MD - Appointments are:  Medication management: Monday and Wednesday 8:00 AM to 4:00 PM Procedure day: Tuesday and Thursday 7:30 AM to 4:00 PM Bilal Lateef, MD - Appointments are:    Medication management: Tuesday and Thursday 8:00 AM to 4:00 PM Procedure day: Monday and Wednesday  7:30 AM to 4:00 PM (Last update: 12/31/2019) ____________________________________________________________________________________________    

## 2020-08-03 NOTE — Progress Notes (Signed)
PROVIDER NOTE: Information contained herein reflects review and annotations entered in association with encounter. Interpretation of such information and data should be left to medically-trained personnel. Information provided to patient can be located elsewhere in the medical record under "Patient Instructions". Document created using STT-dictation technology, any transcriptional errors that may result from process are unintentional.    Patient: Natasha Hanson  Service Category: E/M  Provider: Gaspar Cola, MD  DOB: 08/13/1965  DOS: 08/04/2020  Specialty: Interventional Pain Management  MRN: [id number]  Setting: Ambulatory outpatient  PCP: No primary care provider on file.  Type: Established Patient    Referring Provider: No ref. provider found  Location: Office  Delivery: Face-to-face     HPI  Natasha Hanson, a 55 y.o. year old female, is here today because of her Chronic pain syndrome [G89.4]. Ms. Pongratz primary complain today is Back Pain Last encounter: My last encounter with her was on 05/10/2020. Pertinent problems: Ms. Keegan has Chronic pain syndrome; Cervicalgia; DDD (degenerative disc disease), cervical; Cervical facet hypertrophy; Cervical facet syndrome; Cervical central spinal stenosis; Abnormal MRI, cervical spine (02/20/2019); Cervical foraminal stenosis; Chronic low back pain (1ry area of Pain) (Bilateral) (Midline) (L>R) w/o sciatica; Chronic knee pain (2ry area of Pain) (Bilateral) (L>R); Chronic neck pain (3ry area of Pain) (Bilateral) (L>R); DDD (degenerative disc disease), lumbosacral; Lumbar facet hypertrophy (Multilevel) (Bilateral); Lumbar facet syndrome (Bilateral); Osteoarthritis of knee (Left); Osteoarthritis of knee (Right); Osteoarthritis of the knees (Bilateral); Chronic sacroiliac joint pain (Bilateral); Somatic dysfunction of sacroiliac joints (Bilateral); Other spondylosis, sacral and sacrococcygeal region; Spondylosis without myelopathy or radiculopathy,  lumbosacral region; Chronic hip pain (Bilateral); Neurogenic pain; Chronic musculoskeletal pain; Arm numbness; and Other intervertebral disc degeneration, lumbar region on their pertinent problem list. Pain Assessment: Severity of Chronic pain is reported as a 9 /10. Location: Back Lower,Mid/Denies. Onset: More than a month ago. Quality: Aching,Burning. Timing: Constant. Modifying factor(s): meds. Vitals:  height is 5' 3"  (1.6 m) and weight is 130 lb (59 kg). Her temporal temperature is 97.1 F (36.2 C) (abnormal). Her blood pressure is 105/58 (abnormal) and her pulse is 59 (abnormal). Her respiration is 16 and oxygen saturation is 100%.   Reason for encounter: medication management. The patient indicates doing well with the current medication regimen. No adverse reactions or side effects reported to the medications.  Patient comes into the clinic today indicating that she is having a problem with "pinkeye".  This seems to be affecting her left eye. The patient indicates having more pain than usual in her lower back and as a consequence of this it appears that she has occasionally taken 4 pills/day, as opposed to the 3 that she is allowed to. I reminded her that if she runs out of medicine early we will not be refilling it. She has requested that we repeat the procedure that we did for her around May of last year. Review of the medical record reveals that the only procedure that she has had done was a diagnostic bilateral lumbar facet block #1 under fluoroscopic guidance and IV sedation none none 10/28/19. She refers that this worked very well for her and she would like to have it repeated.  RTCB: 11/15/2020 Nonopioids transferred 05/10/2020: Magnesium, Robaxin, Neurontin, turmeric, and Mobic.  Pharmacotherapy Assessment   Analgesic: Oxycodone IR 5 mg every 8 hours (15 mg/day of oxycodone) Highest recorded MME/day: 30 mg/day MME/day: 22.5 mg/day   Monitoring: [place] PMP: PDMP reviewed during this  encounter.  Pharmacotherapy: No side-effects or adverse reactions reported. Compliance: No problems identified. Effectiveness: Clinically acceptable.  Chauncey Fischer, RN  08/04/2020  3:09 PM  Sign when Signing Visit Nursing Pain Medication Assessment:  Safety precautions to be maintained throughout the outpatient stay will include: orient to surroundings, keep bed in low position, maintain call bell within reach at all times, provide assistance with transfer out of bed and ambulation.  Medication Inspection Compliance: Pill count conducted under aseptic conditions, in front of the patient. Neither the pills nor the bottle was removed from the patient's sight at any time. Once count was completed pills were immediately returned to the patient in their original bottle.  Medication: Oxycodone IR Pill/Patch Count: 28 of 90 pills remain Pill/Patch Appearance: Markings consistent with prescribed medication Bottle Appearance: Standard pharmacy container. Clearly labeled. Filled Date: 2 / 6 / 22 Last Medication intake:  TodaySafety precautions to be maintained throughout the outpatient stay will include: orient to surroundings, keep bed in low position, maintain call bell within reach at all times, provide assistance with transfer out of bed and ambulation.     UDS:  Summary  Date Value Ref Range Status  11/06/2019 Note  Final    Comment:    ==================================================================== [employer] [facility] (MW) ==================================================================== Test                             Result       Flag       Units Drug Present and Declared for Prescription Verification   Oxycodone                      942          EXPECTED   ng/mg creat   Oxymorphone                    1903         EXPECTED   ng/mg creat   Noroxycodone                   3335         EXPECTED   ng/mg creat   Noroxymorphone                 1689         EXPECTED   ng/mg  creat    Sources of oxycodone are scheduled prescription medications.    Oxymorphone, noroxycodone, and noroxymorphone are expected    metabolites of oxycodone. Oxymorphone is also available as a    scheduled prescription medication. ==================================================================== Test                      Result    Flag   Units      Ref Range   Creatinine              117              mg/dL      >=20 ==================================================================== Declared Medications:  The flagging and interpretation on this report are based on the  following declared medications.  Unexpected results may arise from  inaccuracies in the declared medications.  **Note: The testing scope of this panel includes these medications:  Oxycodone  **Note: The testing scope of this panel does not include the  following reported medications:  Albuterol (Ventolin HFA)  Gabapentin (Neurontin)  Magnesium  Meloxicam (Mobic)  Methocarbamol (Robaxin)  Propranolol (Inderal)  Turmeric ==================================================================== For clinical consultation, please call [phone]. ====================================================================      ROS  Constitutional: Denies any fever or chills Gastrointestinal: No reported hemesis, hematochezia, vomiting, or acute GI distress Musculoskeletal: Denies any acute onset joint swelling, redness, loss of ROM, or weakness Neurological: No reported episodes of acute onset apraxia, aphasia, dysarthria, agnosia, amnesia, paralysis, loss of coordination, or loss of consciousness  Medication Review  Magnesium Oxide, Turmeric, albuterol, gabapentin, meloxicam, methocarbamol, oxyCODONE, propranolol, and sucralfate  History Review  Allergy: Ms. Jeanmarie has No Known Allergies. Drug: Ms. Remick  has no history on file for drug use. Alcohol:  reports no history of alcohol use. Tobacco:  reports that she has  been smoking. She has never used smokeless tobacco. Social: Ms. Worthey  reports that she has been smoking. She has never used smokeless tobacco. She reports that she does not drink alcohol. Medical:  has a past medical history of Allergy, Arthritis, COPD (chronic obstructive pulmonary disease) ([place]), and Glaucoma. Surgical: Ms. Kerwin  has a past surgical history that includes Refractive surgery; Abdominal hysterectomy; Foot surgery; and Foot Fusion. Family: family history is not on file.  Laboratory Chemistry Profile   Renal Lab Results  Component Value Date   BUN 15 12/30/2019   CREATININE 0.73 12/30/2019   BCR 12 09/09/2019   GFRAA >60 12/30/2019   GFRNONAA >60 12/30/2019     Hepatic Lab Results  Component Value Date   AST 15 12/30/2019   ALT 13 12/30/2019   ALBUMIN 3.9 12/30/2019   ALKPHOS 67 12/30/2019   LIPASE 20 12/30/2019     Electrolytes Lab Results  Component Value Date   NA 138 12/30/2019   K 3.9 12/30/2019   CL 105 12/30/2019   CALCIUM 8.9 12/30/2019   MG 1.8 09/09/2019     Bone Lab Results  Component Value Date   25OHVITD1 33 09/09/2019   25OHVITD2 <1.0 09/09/2019   25OHVITD3 33 09/09/2019     Inflammation (CRP: Acute Phase) (ESR: Chronic Phase) Lab Results  Component Value Date   CRP 2 09/09/2019   ESRSEDRATE 7 09/09/2019       Note: Above Lab results reviewed.  Recent Imaging Review  DG Chest 2 View CLINICAL DATA:  Productive cough for several weeks  EXAM: CHEST - 2 VIEW  COMPARISON:  12/30/2019  FINDINGS: Lungs are mildly hyperinflated. No focal infiltrate or sizable effusion is seen. No bony abnormality is noted. Cardiac shadow is within normal limits. Aortic calcifications are seen.  IMPRESSION: COPD without acute abnormality.  Electronically Signed   By: Inez Catalina M.D.   On: 02/20/2020 07:55 Note: Reviewed        Physical Exam  General appearance: Well nourished, well developed, and well hydrated. In no apparent acute  distress Mental status: Alert, oriented x 3 (person, place, & time)       Respiratory: No evidence of acute respiratory distress Eyes: PERLA Vitals: BP (!) 105/58 (BP Location: Left Arm, Patient Position: Sitting, Cuff Size: Large)   Pulse (!) 59   Temp (!) 97.1 F (36.2 C) (Temporal)   Resp 16   Ht 5' 3"  (1.6 m)   Wt 130 lb (59 kg)   SpO2 100%   BMI 23.03 kg/m  BMI: Estimated body mass index is 23.03 kg/m as calculated from the following:   Height as of this encounter: 5' 3"  (1.6 m).   Weight as of this encounter: 130 lb (59 kg). Ideal: Ideal body weight: 52.4 kg (115  lb 8.3 oz) Adjusted ideal body weight: 55 kg (121 lb 5 oz)  Assessment   Status Diagnosis  Worsening Recurring Worsened 1. Chronic pain syndrome   2. Lumbar facet syndrome (Bilateral)   3. Chronic low back pain (1ry area of Pain) (Bilateral) (Midline) (L>R) w/o sciatica   4. DDD (degenerative disc disease), lumbosacral   5. Lumbar facet hypertrophy (Multilevel) (Bilateral)   6. Chronic knee pain (2ry area of Pain) (Bilateral) (L>R)   7. Chronic neck pain (3ry area of Pain) (Bilateral) (L>R)   8. Cervical facet syndrome   9. Chronic sacroiliac joint pain (Bilateral)   10. DDD (degenerative disc disease), cervical   11. Pharmacologic therapy      Updated Problems: No problems updated.  Plan of Care  Problem-specific:  No problem-specific Assessment & Plan notes found for this encounter.  Ms. ZENORA KARPEL has a current medication list which includes the following long-term medication(s): albuterol, gabapentin, magnesium oxide, meloxicam, methocarbamol, [START ON 08/17/2020] oxycodone, [START ON 09/16/2020] oxycodone, [START ON 10/16/2020] oxycodone, propranolol, turmeric, and sucralfate.  Pharmacotherapy (Medications Ordered): Meds ordered this encounter  Medications  . oxyCODONE (OXY IR/ROXICODONE) 5 MG immediate release tablet    Sig: Take 1 tablet (5 mg total) by mouth every 8 (eight) hours as needed for  severe pain. Must last 30 days.    Dispense:  90 tablet    Refill:  0    Chronic Pain: STOP Act (Not applicable) Fill 1 day early if closed on refill date.  Avoid benzodiazepines within 8 hours of opioids  . oxyCODONE (OXY IR/ROXICODONE) 5 MG immediate release tablet    Sig: Take 1 tablet (5 mg total) by mouth every 8 (eight) hours as needed for severe pain. Must last 30 days.    Dispense:  90 tablet    Refill:  0    Chronic Pain: STOP Act (Not applicable) Fill 1 day early if closed on refill date.  Avoid benzodiazepines within 8 hours of opioids  . oxyCODONE (OXY IR/ROXICODONE) 5 MG immediate release tablet    Sig: Take 1 tablet (5 mg total) by mouth every 8 (eight) hours as needed for severe pain. Must last 30 days.    Dispense:  90 tablet    Refill:  0    Chronic Pain: STOP Act (Not applicable) Fill 1 day early if closed on refill date.  Avoid benzodiazepines within 8 hours of opioids   Orders:  Orders Placed This Encounter  Procedures  . LUMBAR FACET(MEDIAL BRANCH NERVE BLOCK) MBNB    Standing Status:   Future    Standing Expiration Date:   09/01/2020    Scheduling Instructions:     Procedure: Lumbar facet block (AKA.: Lumbosacral medial branch nerve block)     Side: Bilateral     Level: L3-4, L4-5, & L5-S1 Facets (L2, L3, L4, L5, & S1 Medial Branch Nerves)     Sedation: Patient's choice.     Timeframe: ASAA    Order Specific Question:   Where will this procedure be performed?    Answer:   [facility]   Follow-up plan:   Return for Procedure (w/ sedation): (B) L-FCT BLK #2.      Interventional management options: Planned, scheduled, and/or pending:    Diagnostic bilateral lumbar facet block #1    Considering:   Diagnostic left lumbar ESI  Diagnostic bilateral lumbar facet block  Possible bilateral lumbar facet RFA  Diagnostic bilateral intra-articular knee joint injection  Possible bilateral Hyalgan knee  injections  Diagnostic bilateral genicular nerve block   Possible bilateral genicular nerve RFA  Diagnostic left cervical ESI  Diagnostic bilateral cervical facet block  Possible bilateral cervical facet RFA    PRN Procedures:   None at this time       Recent Visits Date Type Provider Dept  05/10/20 Office Visit Milinda Pointer, MD [facility]  Showing recent visits within past 90 days and meeting all other requirements Today's Visits Date Type Provider Dept  08/04/20 Office Visit Milinda Pointer, MD [facility]  Showing today's visits and meeting all other requirements Future Appointments No visits were found meeting these conditions. Showing future appointments within next 90 days and meeting all other requirements  I discussed the assessment and treatment plan with the patient. The patient was provided an opportunity to ask questions and all were answered. The patient agreed with the plan and demonstrated an understanding of the instructions.  Patient advised to call back or seek an in-person evaluation if the symptoms or condition worsens.  Duration of encounter: 30 minutes.  Note by: Gaspar Cola, MD Date: 08/04/2020; Time: 3:27 PM

## 2020-08-04 ENCOUNTER — Ambulatory Visit: Payer: Self-pay | Attending: Pain Medicine | Admitting: Pain Medicine

## 2020-08-04 ENCOUNTER — Other Ambulatory Visit: Payer: Self-pay

## 2020-08-04 ENCOUNTER — Encounter: Payer: Self-pay | Admitting: Pain Medicine

## 2020-08-04 VITALS — BP 105/58 | HR 59 | Temp 97.1°F | Resp 16 | Ht 63.0 in | Wt 130.0 lb

## 2020-08-04 DIAGNOSIS — G894 Chronic pain syndrome: Secondary | ICD-10-CM

## 2020-08-04 DIAGNOSIS — M5137 Other intervertebral disc degeneration, lumbosacral region: Secondary | ICD-10-CM

## 2020-08-04 DIAGNOSIS — M545 Low back pain, unspecified: Secondary | ICD-10-CM

## 2020-08-04 DIAGNOSIS — M25562 Pain in left knee: Secondary | ICD-10-CM

## 2020-08-04 DIAGNOSIS — M25561 Pain in right knee: Secondary | ICD-10-CM

## 2020-08-04 DIAGNOSIS — M542 Cervicalgia: Secondary | ICD-10-CM

## 2020-08-04 DIAGNOSIS — M5030 Other cervical disc degeneration, unspecified cervical region: Secondary | ICD-10-CM

## 2020-08-04 DIAGNOSIS — M47812 Spondylosis without myelopathy or radiculopathy, cervical region: Secondary | ICD-10-CM

## 2020-08-04 DIAGNOSIS — M533 Sacrococcygeal disorders, not elsewhere classified: Secondary | ICD-10-CM

## 2020-08-04 DIAGNOSIS — G8929 Other chronic pain: Secondary | ICD-10-CM

## 2020-08-04 DIAGNOSIS — M47816 Spondylosis without myelopathy or radiculopathy, lumbar region: Secondary | ICD-10-CM

## 2020-08-04 DIAGNOSIS — Z79899 Other long term (current) drug therapy: Secondary | ICD-10-CM

## 2020-08-04 MED ORDER — OXYCODONE HCL 5 MG PO TABS: 5.0000 mg | ORAL_TABLET | Freq: Three times a day (TID) | ORAL | 0 refills | Status: DC | PRN

## 2020-08-04 NOTE — Progress Notes (Signed)
Nursing Pain Medication Assessment:  Safety precautions to be maintained throughout the outpatient stay will include: orient to surroundings, keep bed in low position, maintain call bell within reach at all times, provide assistance with transfer out of bed and ambulation.  Medication Inspection Compliance: Pill count conducted under aseptic conditions, in front of the patient. Neither the pills nor the bottle was removed from the patient's sight at any time. Once count was completed pills were immediately returned to the patient in their original bottle.  Medication: Oxycodone IR Pill/Patch Count: 28 of 90 pills remain Pill/Patch Appearance: Markings consistent with prescribed medication Bottle Appearance: Standard pharmacy container. Clearly labeled. Filled Date: 2 / 6 / 22 Last Medication intake:  TodaySafety precautions to be maintained throughout the outpatient stay will include: orient to surroundings, keep bed in low position, maintain call bell within reach at all times, provide assistance with transfer out of bed and ambulation.

## 2020-08-04 NOTE — Patient Instructions (Addendum)
____________________________________________________________________________________________  Medication Rules  Purpose: To inform patients, and their family members, of our rules and regulations.  Applies to: All patients receiving prescriptions (written or electronic).  Pharmacy of record: Pharmacy where electronic prescriptions will be sent. If written prescriptions are taken to a different pharmacy, please inform the nursing staff. The pharmacy listed in the electronic medical record should be the one where you would like electronic prescriptions to be sent.  Electronic prescriptions: In compliance with the [place] Strengthen Opioid Misuse Prevention (STOP) Act of 2017 (Session Law 2017-74/H243), effective June 12, 2018, all controlled substances must be electronically prescribed. Calling prescriptions to the pharmacy will cease to exist.  Prescription refills: Only during scheduled appointments. Applies to all prescriptions.  NOTE: The following applies primarily to controlled substances (Opioid* Pain Medications).   Type of encounter (visit): For patients receiving controlled substances, face-to-face visits are required. (Not an option or up to the patient.)  Patient's responsibilities: 1. Pain Pills: Bring all pain pills to every appointment (except for procedure appointments). 2. Pill Bottles: Bring pills in original pharmacy bottle. Always bring the newest bottle. Bring bottle, even if empty. 3. Medication refills: You are responsible for knowing and keeping track of what medications you take and those you need refilled. The day before your appointment: write a list of all prescriptions that need to be refilled. The day of the appointment: give the list to the admitting nurse. Prescriptions will be written only during appointments. No prescriptions will be written on procedure days. If you forget a medication: it will not be "Called in", "Faxed", or "electronically sent".  You will need to get another appointment to get these prescribed. No early refills. Do not call asking to have your prescription filled early. 4. Prescription Accuracy: You are responsible for carefully inspecting your prescriptions before leaving our office. Have the discharge nurse carefully go over each prescription with you, before taking them home. Make sure that your name is accurately spelled, that your address is correct. Check the name and dose of your medication to make sure it is accurate. Check the number of pills, and the written instructions to make sure they are clear and accurate. Make sure that you are given enough medication to last until your next medication refill appointment. 5. Taking Medication: Take medication as prescribed. When it comes to controlled substances, taking less pills or less frequently than prescribed is permitted and encouraged. Never take more pills than instructed. Never take medication more frequently than prescribed.  6. Inform other Doctors: Always inform, all of your healthcare providers, of all the medications you take. 7. Pain Medication from other Providers: You are not allowed to accept any additional pain medication from any other Doctor or Healthcare provider. There are two exceptions to this rule. (see below) In the event that you require additional pain medication, you are responsible for notifying us, as stated below. 8. Cough Medicine: Often these contain an opioid, such as codeine or hydrocodone. Never accept or take cough medicine containing these opioids if you are already taking an opioid* medication. The combination may cause respiratory failure and death. 9. Medication Agreement: You are responsible for carefully reading and following our Medication Agreement. This must be signed before receiving any prescriptions from our practice. Safely store a copy of your signed Agreement. Violations to the Agreement will result in no further prescriptions.  (Additional copies of our Medication Agreement are available upon request.) 10. Laws, Rules, & Regulations: All patients are expected to follow all   Federal and State Laws, Statutes, Rules, & Regulations. Ignorance of the Laws does not constitute a valid excuse.  11. Illegal drugs and Controlled Substances: The use of illegal substances (including, but not limited to marijuana and its derivatives) and/or the illegal use of any controlled substances is strictly prohibited. Violation of this rule may result in the immediate and permanent discontinuation of any and all prescriptions being written by our practice. The use of any illegal substances is prohibited. 12. Adopted CDC guidelines & recommendations: Target dosing levels will be at or below 60 MME/day. Use of benzodiazepines** is not recommended.  Exceptions: There are only two exceptions to the rule of not receiving pain medications from other Healthcare Providers. 1. Exception #1 (Emergencies): In the event of an emergency (i.e.: accident requiring emergency care), you are allowed to receive additional pain medication. However, you are responsible for: As soon as you are able, call our office [phone], at any time of the day or night, and leave a message stating your name, the date and nature of the emergency, and the name and dose of the medication prescribed. In the event that your call is answered by a member of our staff, make sure to document and save the date, time, and the name of the person that took your information.  2. Exception #2 (Planned Surgery): In the event that you are scheduled by another doctor or dentist to have any type of surgery or procedure, you are allowed (for a period no longer than 30 days), to receive additional pain medication, for the acute post-op pain. However, in this case, you are responsible for picking up a copy of our "Post-op Pain Management for Surgeons" handout, and giving it to your surgeon or dentist. This  document is available at our office, and does not require an appointment to obtain it. Simply go to our office during business hours (Monday-Thursday from 8:00 AM to 4:00 PM) (Friday 8:00 AM to 12:00 Noon) or if you have a scheduled appointment with us, prior to your surgery, and ask for it by name. In addition, you are responsible for: calling our office [phone], at any time of the day or night, and leaving a message stating your name, name of your surgeon, type of surgery, and date of procedure or surgery. Failure to comply with your responsibilities may result in termination of therapy involving the controlled substances.  *Opioid medications include: morphine, codeine, oxycodone, oxymorphone, hydrocodone, hydromorphone, meperidine, tramadol, tapentadol, buprenorphine, fentanyl, methadone. **Benzodiazepine medications include: diazepam (Valium), alprazolam (Xanax), clonazepam (Klonopine), lorazepam (Ativan), clorazepate (Tranxene), chlordiazepoxide (Librium), estazolam (Prosom), oxazepam (Serax), temazepam (Restoril), triazolam (Halcion) (Last updated: 05/10/2020) ____________________________________________________________________________________________   ____________________________________________________________________________________________  Medication Recommendations and Reminders  Applies to: All patients receiving prescriptions (written and/or electronic).  Medication Rules & Regulations: These rules and regulations exist for your safety and that of others. They are not flexible and neither are we. Dismissing or ignoring them will be considered "non-compliance" with medication therapy, resulting in complete and irreversible termination of such therapy. (See document titled "Medication Rules" for more details.) In all conscience, because of safety reasons, we cannot continue providing a therapy where the patient does not follow instructions.  Pharmacy of record:   Definition:  This is the pharmacy where your electronic prescriptions will be sent.   We do not endorse any particular pharmacy, however, we have experienced problems with [facility] not securing enough medication supply for the community.  We do not restrict you in your choice of pharmacy. However,   once we write for your prescriptions, we will NOT be re-sending more prescriptions to fix restricted supply problems created by your pharmacy, or your insurance.   The pharmacy listed in the electronic medical record should be the one where you want electronic prescriptions to be sent.  If you choose to change pharmacy, simply notify our nursing staff.  Recommendations:  Keep all of your pain medications in a safe place, under lock and key, even if you live alone. We will NOT replace lost, stolen, or damaged medication.  After you fill your prescription, take 1 week's worth of pills and put them away in a safe place. You should keep a separate, properly labeled bottle for this purpose. The remainder should be kept in the original bottle. Use this as your primary supply, until it runs out. Once it's gone, then you know that you have 1 week's worth of medicine, and it is time to come in for a prescription refill. If you do this correctly, it is unlikely that you will ever run out of medicine.  To make sure that the above recommendation works, it is very important that you make sure your medication refill appointments are scheduled at least 1 week before you run out of medicine. To do this in an effective manner, make sure that you do not leave the office without scheduling your next medication management appointment. Always ask the nursing staff to show you in your prescription , when your medication will be running out. Then arrange for the receptionist to get you a return appointment, at least 7 days before you run out of medicine. Do not wait until you have 1 or 2 pills left, to come in. This is very poor planning and  does not take into consideration that we may need to cancel appointments due to bad weather, sickness, or emergencies affecting our staff.  DO NOT ACCEPT A "Partial Fill": If for any reason your pharmacy does not have enough pills/tablets to completely fill or refill your prescription, do not allow for a "partial fill". The law allows the pharmacy to complete that prescription within 72 hours, without requiring a new prescription. If they do not fill the rest of your prescription within those 72 hours, you will need a separate prescription to fill the remaining amount, which we will NOT provide. If the reason for the partial fill is your insurance, you will need to talk to the pharmacist about payment alternatives for the remaining tablets, but again, DO NOT ACCEPT A PARTIAL FILL, unless you can trust your pharmacist to obtain the remainder of the pills within 72 hours.  Prescription refills and/or changes in medication(s):   Prescription refills, and/or changes in dose or medication, will be conducted only during scheduled medication management appointments. (Applies to both, written and electronic prescriptions.)  No refills on procedure days. No medication will be changed or started on procedure days. No changes, adjustments, and/or refills will be conducted on a procedure day. Doing so will interfere with the diagnostic portion of the procedure.  No phone refills. No medications will be "called into the pharmacy".  No Fax refills.  No weekend refills.  No Holliday refills.  No after hours refills.  Remember:  Business hours are:  Monday to Thursday 8:00 AM to 4:00 PM Provider's Schedule: Korvin Valentine, MD - Appointments are:  Medication management: Monday and Wednesday 8:00 AM to 4:00 PM Procedure day: Tuesday and Thursday 7:30 AM to 4:00 PM Bilal Lateef, MD - Appointments are:    Medication management: Tuesday and Thursday 8:00 AM to 4:00 PM Procedure day: Monday and Wednesday  7:30 AM to 4:00 PM (Last update: 12/31/2019) ____________________________________________________________________________________________   ____________________________________________________________________________________________  CBD (cannabidiol) WARNING  Applicable to: All individuals currently taking or considering taking CBD (cannabidiol) and, more important, all patients taking opioid analgesic controlled substances (pain medication). (Example: oxycodone; oxymorphone; hydrocodone; hydromorphone; morphine; methadone; tramadol; tapentadol; fentanyl; buprenorphine; butorphanol; dextromethorphan; meperidine; codeine; etc.)  Legal status: CBD remains a Schedule I drug prohibited for any use. CBD is illegal with one exception. In the [place], CBD has a limited Food and Drug Administration (FDA) approval for the treatment of two specific types of epilepsy disorders. Only one CBD product has been approved by the FDA for this purpose: "Epidiolex". FDA is aware that some companies are marketing products containing cannabis and cannabis-derived compounds in ways that violate the Federal Food, Drug and Cosmetic Act (FD&C Act) and that may put the health and safety of consumers at risk. The FDA, a Federal agency, has not enforced the CBD status since 2018.   Legality: Some manufacturers ship CBD products nationally, which is illegal. Often such products are sold online and are therefore available throughout the country. CBD is openly sold in head shops and health food stores in some states where such sales have not been explicitly legalized. Selling unapproved products with unsubstantiated therapeutic claims is not only a violation of the law, but also can put patients at risk, as these products have not been proven to be safe or effective. Federal illegality makes it difficult to conduct research on CBD.  Reference: "FDA Regulation of Cannabis and Cannabis-Derived Products, Including Cannabidiol  (CBD)" - https://www.fda.gov/news-events/public-health-focus/fda-regulation-cannabis-and-cannabis-derived-products-including-cannabidiol-cbd  Warning: CBD is not FDA approved and has not undergo the same manufacturing controls as prescription drugs.  This means that the purity and safety of available CBD may be questionable. Most of the time, despite manufacturer's claims, it is contaminated with THC (delta-9-tetrahydrocannabinol - the chemical in marijuana responsible for the "HIGH").  When this is the case, the THC contaminant will trigger a positive urine drug screen (UDS) test for Marijuana (carboxy-THC). Because a positive UDS for any illicit substance is a violation of our medication agreement, your opioid analgesics (pain medicine) may be permanently discontinued.  MORE ABOUT CBD  General Information: CBD  is a derivative of the Marijuana (cannabis sativa) plant discovered in 1940. It is one of the 113 identified substances found in Marijuana. It accounts for up to 40% of the plant's extract. As of 2018, preliminary clinical studies on CBD included research for the treatment of anxiety, movement disorders, and pain. CBD is available and consumed in multiple forms, including inhalation of smoke or vapor, as an aerosol spray, and by mouth. It may be supplied as an oil containing CBD, capsules, dried cannabis, or as a liquid solution. CBD is thought not to be as psychoactive as THC (delta-9-tetrahydrocannabinol - the chemical in marijuana responsible for the "HIGH"). Studies suggest that CBD may interact with different biological target receptors in the body, including cannabinoid and other neurotransmitter receptors. As of 2018 the mechanism of action for its biological effects has not been determined.  Side-effects  Adverse reactions: Dry mouth, diarrhea, decreased appetite, fatigue, drowsiness, malaise, weakness, sleep disturbances, and others.  Drug interactions: CBC may interact with other  medications such as blood-thinners. (Last update: 01/17/2020) ____________________________________________________________________________________________   ____________________________________________________________________________________________  Preparing for Procedure with Sedation  Procedure appointments are limited to planned procedures: . No Prescription Refills. . No disability issues   will be discussed. . No medication changes will be discussed.  Instructions: . Oral Intake: Do not eat or drink anything for at least 8 hours prior to your procedure. (Exception: Blood Pressure Medication. See below.) . Transportation: Unless otherwise stated by your physician, you may drive yourself after the procedure. . Blood Pressure Medicine: Do not forget to take your blood pressure medicine with a sip of water the morning of the procedure. If your Diastolic (lower reading)is above 100 mmHg, elective cases will be cancelled/rescheduled. . Blood thinners: These will need to be stopped for procedures. Notify our staff if you are taking any blood thinners. Depending on which one you take, there will be specific instructions on how and when to stop it. . Diabetics on insulin: Notify the staff so that you can be scheduled 1st case in the morning. If your diabetes requires high dose insulin, take only  of your normal insulin dose the morning of the procedure and notify the staff that you have done so. . Preventing infections: Shower with an antibacterial soap the morning of your procedure. . Build-up your immune system: Take 1000 mg of Vitamin C with every meal (3 times a day) the day prior to your procedure. Marland Kitchen Antibiotics: Inform the staff if you have a condition or reason that requires you to take antibiotics before dental procedures. . Pregnancy: If you are pregnant, call and cancel the procedure. . Sickness: If you have a cold, fever, or any active infections, call and cancel the  procedure. . Arrival: You must be in the facility at least 30 minutes prior to your scheduled procedure. . Children: Do not bring children with you. . Dress appropriately: Bring dark clothing that you would not mind if they get stained. . Valuables: Do not bring any jewelry or valuables.  Reasons to call and reschedule or cancel your procedure: (Following these recommendations will minimize the risk of a serious complication.) . Surgeries: Avoid having procedures within 2 weeks of any surgery. (Avoid for 2 weeks before or after any surgery). . Flu Shots: Avoid having procedures within 2 weeks of a flu shots or . (Avoid for 2 weeks before or after immunizations). . Barium: Avoid having a procedure within 7-10 days after having had a radiological study involving the use of radiological contrast. (Myelograms, Barium swallow or enema study). . Heart attacks: Avoid any elective procedures or surgeries for the initial 6 months after a "Myocardial Infarction" (Heart Attack). . Blood thinners: It is imperative that you stop these medications before procedures. Let us know if you if you take any blood thinner.  . Infection: Avoid procedures during or within two weeks of an infection (including chest colds or gastrointestinal problems). Symptoms associated with infections include: Localized redness, fever, chills, night sweats or profuse sweating, burning sensation when voiding, cough, congestion, stuffiness, runny nose, sore throat, diarrhea, nausea, vomiting, cold or Flu symptoms, recent or current infections. It is specially important if the infection is over the area that we intend to treat. Marland Kitchen Heart and lung problems: Symptoms that may suggest an active cardiopulmonary problem include: cough, chest pain, breathing difficulties or shortness of breath, dizziness, ankle swelling, uncontrolled high or unusually low blood pressure, and/or palpitations. If you are experiencing any of these symptoms, cancel your  procedure and contact your primary care physician for an evaluation.  Remember:  Regular Business hours are:  Monday to Thursday 8:00 AM to 4:00 PM  Provider's Schedule: Milinda Pointer, MD:  Procedure days: Tuesday and Thursday 7:30  AM to 4:00 PM  Gillis Santa, MD:  Procedure days: Monday and Wednesday 7:30 AM to 4:00 PM ____________________________________________________________________________________________

## 2020-08-10 ENCOUNTER — Other Ambulatory Visit: Payer: Self-pay

## 2020-08-10 ENCOUNTER — Ambulatory Visit (HOSPITAL_BASED_OUTPATIENT_CLINIC_OR_DEPARTMENT_OTHER): Payer: Self-pay | Admitting: Pain Medicine

## 2020-08-10 ENCOUNTER — Ambulatory Visit
Admission: RE | Admit: 2020-08-10 | Discharge: 2020-08-10 | Disposition: A | Discharge status: Home / Self Care | Payer: Self-pay | Source: Ambulatory Visit | Attending: Pain Medicine | Admitting: Pain Medicine

## 2020-08-10 ENCOUNTER — Encounter: Payer: Self-pay | Admitting: Pain Medicine

## 2020-08-10 VITALS — BP 106/64 | HR 56 | Temp 98.1°F | Resp 14 | Ht 63.0 in | Wt 130.0 lb

## 2020-08-10 DIAGNOSIS — M47817 Spondylosis without myelopathy or radiculopathy, lumbosacral region: Secondary | ICD-10-CM

## 2020-08-10 DIAGNOSIS — M47816 Spondylosis without myelopathy or radiculopathy, lumbar region: Secondary | ICD-10-CM

## 2020-08-10 DIAGNOSIS — M5137 Other intervertebral disc degeneration, lumbosacral region: Secondary | ICD-10-CM | POA: Insufficient documentation

## 2020-08-10 DIAGNOSIS — G8929 Other chronic pain: Secondary | ICD-10-CM

## 2020-08-10 DIAGNOSIS — M545 Low back pain, unspecified: Secondary | ICD-10-CM | POA: Insufficient documentation

## 2020-08-10 MED ORDER — LACTATED RINGERS IV SOLN
1000.0000 mL | Freq: Once | INTRAVENOUS | Status: AC
  Administered 2020-08-10: 1000 mL via INTRAVENOUS

## 2020-08-10 MED ORDER — FENTANYL CITRATE (PF) 100 MCG/2ML IJ SOLN
25.0000 ug | INTRAMUSCULAR | Status: DC | PRN
  Administered 2020-08-10: 50 ug via INTRAVENOUS
  Filled 2020-08-10: qty 2

## 2020-08-10 MED ORDER — TRIAMCINOLONE ACETONIDE 40 MG/ML IJ SUSP
80.0000 mg | Freq: Once | INTRAMUSCULAR | Status: AC
  Administered 2020-08-10: 80 mg
  Filled 2020-08-10: qty 2

## 2020-08-10 MED ORDER — MIDAZOLAM HCL 5 MG/5ML IJ SOLN
1.0000 mg | INTRAMUSCULAR | Status: DC | PRN
  Administered 2020-08-10: 2 mg via INTRAVENOUS
  Filled 2020-08-10: qty 5

## 2020-08-10 MED ORDER — LIDOCAINE HCL 2 % IJ SOLN
20.0000 mL | Freq: Once | INTRAMUSCULAR | Status: AC
  Administered 2020-08-10: 100 mg
  Filled 2020-08-10: qty 20

## 2020-08-10 MED ORDER — ROPIVACAINE HCL 2 MG/ML IJ SOLN
18.0000 mL | Freq: Once | INTRAMUSCULAR | Status: AC
  Administered 2020-08-10: 18 mL via PERINEURAL
  Filled 2020-08-10: qty 20

## 2020-08-10 NOTE — Progress Notes (Signed)
Safety precautions to be maintained throughout the outpatient stay will include: orient to surroundings, keep bed in low position, maintain call bell within reach at all times, provide assistance with transfer out of bed and ambulation.  

## 2020-08-10 NOTE — Progress Notes (Signed)
PROVIDER NOTE: Information contained herein reflects review and annotations entered in association with encounter. Interpretation of such information and data should be left to medically-trained personnel. Information provided to patient can be located elsewhere in the medical record under "Patient Instructions". Document created using STT-dictation technology, any transcriptional errors that may result from process are unintentional.    Patient: Natasha Hanson  Service Category: Procedure  Provider: Oswaldo Done, MD  DOB: [birth year]  DOS: 08/10/2020  Location: [facility]  MRN: [id number]  Setting: Ambulatory - outpatient  Referring Provider: No ref. provider found  Type: Established Patient  Specialty: Interventional Pain Management  PCP: No primary care provider on file.   Primary Reason for Visit: Interventional Pain Management Treatment. CC: Back Pain (low)  Procedure:          Anesthesia, Analgesia, Anxiolysis:  Type: Lumbar Facet, Medial Branch Block(s) #2  Primary Purpose: Diagnostic Region: Posterolateral Lumbosacral Spine Level: L2, L3, L4, L5, & S1 Medial Branch Level(s). Injecting these levels blocks the L3-4, L4-5, and L5-S1 lumbar facet joints. Laterality: Bilateral  Type: Moderate (Conscious) Sedation combined with Local Anesthesia Indication(s): Analgesia and Anxiety Route: Intravenous (IV) IV Access: Secured Sedation: Meaningful verbal contact was maintained at all times during the procedure  Local Anesthetic: Lidocaine 1-2%  Position: Prone   Indications: 1. Lumbar facet syndrome (Bilateral)   2. Spondylosis without myelopathy or radiculopathy, lumbosacral region   3. Lumbar facet hypertrophy (Multilevel) (Bilateral)   4. DDD (degenerative disc disease), lumbosacral   5. Chronic low back pain (1ry area of Pain) (Bilateral) (Midline) (L>R) w/o sciatica    Pain Score: Pre-procedure: 8 /10 Post-procedure: 0-No pain/10   Pre-op H&P Assessment:   Ms. Bornhorst is a 54 y.o. (year old), female patient, seen today for interventional treatment. She  has a past surgical history that includes Refractive surgery; Abdominal hysterectomy; Foot surgery; and Foot Fusion. Ms. Hobbins has a current medication list which includes the following prescription(s): albuterol, [START ON 08/17/2020] oxycodone, [START ON 09/16/2020] oxycodone, [START ON 10/16/2020] oxycodone, propranolol, gabapentin, magnesium oxide, meloxicam, methocarbamol, sucralfate, and turmeric, and the following Facility-Administered Medications: fentanyl and midazolam. Her primarily concern today is the Back Pain (low)  Initial Vital Signs:  Pulse/HCG Rate: (!) 56ECG Heart Rate: 61 Temp: (!) 97.1 F (36.2 C) Resp: 18 BP: 116/62 SpO2: 100 %  BMI: Estimated body mass index is 23.03 kg/m as calculated from the following:   Height as of this encounter: 5\' 3"  (1.6 m).   Weight as of this encounter: 130 lb (59 kg).  Risk Assessment: Allergies: Reviewed. She has No Known Allergies.  Allergy Precautions: None required Coagulopathies: Reviewed. None identified.  Blood-thinner therapy: None at this time Active Infection(s): Reviewed. None identified. Ms. Tiegs is afebrile  Site Confirmation: Ms. Eriksen was asked to confirm the procedure and laterality before marking the site Procedure checklist: Completed Consent: Before the procedure and under the influence of no sedative(s), amnesic(s), or anxiolytics, the patient was informed of the treatment options, risks and possible complications. To fulfill our ethical and legal obligations, as recommended by the American Medical Association's Code of Ethics, I have informed the patient of my clinical impression; the nature and purpose of the treatment or procedure; the risks, benefits, and possible complications of the intervention; the alternatives, including doing nothing; the risk(s) and benefit(s) of the alternative treatment(s) or procedure(s); and the  risk(s) and benefit(s) of doing nothing. The patient was provided information about the general risks and possible complications associated with  the procedure. These may include, but are not limited to: failure to achieve desired goals, infection, bleeding, organ or nerve damage, allergic reactions, paralysis, and death. In addition, the patient was informed of those risks and complications associated to Spine-related procedures, such as failure to decrease pain; infection (i.e.: Meningitis, epidural or intraspinal abscess); bleeding (i.e.: epidural hematoma, subarachnoid hemorrhage, or any other type of intraspinal or peri-dural bleeding); organ or nerve damage (i.e.: Any type of peripheral nerve, nerve root, or spinal cord injury) with subsequent damage to sensory, motor, and/or autonomic systems, resulting in permanent pain, numbness, and/or weakness of one or several areas of the body; allergic reactions; (i.e.: anaphylactic reaction); and/or death. Furthermore, the patient was informed of those risks and complications associated with the medications. These include, but are not limited to: allergic reactions (i.e.: anaphylactic or anaphylactoid reaction(s)); adrenal axis suppression; blood sugar elevation that in diabetics may result in ketoacidosis or comma; water retention that in patients with history of congestive heart failure may result in shortness of breath, pulmonary edema, and decompensation with resultant heart failure; weight gain; swelling or edema; medication-induced neural toxicity; particulate matter embolism and blood vessel occlusion with resultant organ, and/or nervous system infarction; and/or aseptic necrosis of one or more joints. Finally, the patient was informed that Medicine is not an exact science; therefore, there is also the possibility of unforeseen or unpredictable risks and/or possible complications that may result in a catastrophic outcome. The patient indicated having  understood very clearly. We have given the patient no guarantees and we have made no promises. Enough time was given to the patient to ask questions, all of which were answered to the patient's satisfaction. Ms. Goshert has indicated that she wanted to continue with the procedure. Attestation: I, the ordering provider, attest that I have discussed with the patient the benefits, risks, side-effects, alternatives, likelihood of achieving goals, and potential problems during recovery for the procedure that I have provided informed consent. Date  Time: 08/10/2020  9:54 AM  Pre-Procedure Preparation:  Monitoring: As per clinic protocol. Respiration, ETCO2, SpO2, BP, heart rate and rhythm monitor placed and checked for adequate function Safety Precautions: Patient was assessed for positional comfort and pressure points before starting the procedure. Time-out: I initiated and conducted the "Time-out" before starting the procedure, as per protocol. The patient was asked to participate by confirming the accuracy of the "Time Out" information. Verification of the correct person, site, and procedure were performed and confirmed by me, the nursing staff, and the patient. "Time-out" conducted as per Joint Commission's Universal Protocol (UP.01.01.01). Time: 1022  Description of Procedure:          Laterality: Bilateral. The procedure was performed in identical fashion on both sides. Levels:  L2, L3, L4, L5, & S1 Medial Branch Level(s) Area Prepped: Posterior Lumbosacral Region DuraPrep (Iodine Povacrylex [0.7% available iodine] and Isopropyl Alcohol, 74% w/w) Safety Precautions: Aspiration looking for blood return was conducted prior to all injections. At no point did we inject any substances, as a needle was being advanced. Before injecting, the patient was told to immediately notify me if she was experiencing any new onset of "ringing in the ears, or metallic taste in the mouth". No attempts were made at seeking  any paresthesias. Safe injection practices and needle disposal techniques used. Medications properly checked for expiration dates. SDV (single dose vial) medications used. After the completion of the procedure, all disposable equipment used was discarded in the proper designated medical waste containers. Local Anesthesia: Protocol guidelines  were followed. The patient was positioned over the fluoroscopy table. The area was prepped in the usual manner. The time-out was completed. The target area was identified using fluoroscopy. A 12-in long, straight, sterile hemostat was used with fluoroscopic guidance to locate the targets for each level blocked. Once located, the skin was marked with an approved surgical skin marker. Once all sites were marked, the skin (epidermis, dermis, and hypodermis), as well as deeper tissues (fat, connective tissue and muscle) were infiltrated with a small amount of a short-acting local anesthetic, loaded on a 10cc syringe with a 25G, 1.5-in  Needle. An appropriate amount of time was allowed for local anesthetics to take effect before proceeding to the next step. Local Anesthetic: Lidocaine 2.0% The unused portion of the local anesthetic was discarded in the proper designated containers. Technical explanation of process:  L2 Medial Branch Nerve Block (MBB): The target area for the L2 medial branch is at the junction of the postero-lateral aspect of the superior articular process and the superior, posterior, and medial edge of the transverse process of L3. Under fluoroscopic guidance, a Quincke needle was inserted until contact was made with os over the superior postero-lateral aspect of the pedicular shadow (target area). After negative aspiration for blood, 0.5 mL of the nerve block solution was injected without difficulty or complication. The needle was removed intact. L3 Medial Branch Nerve Block (MBB): The target area for the L3 medial branch is at the junction of the  postero-lateral aspect of the superior articular process and the superior, posterior, and medial edge of the transverse process of L4. Under fluoroscopic guidance, a Quincke needle was inserted until contact was made with os over the superior postero-lateral aspect of the pedicular shadow (target area). After negative aspiration for blood, 0.5 mL of the nerve block solution was injected without difficulty or complication. The needle was removed intact. L4 Medial Branch Nerve Block (MBB): The target area for the L4 medial branch is at the junction of the postero-lateral aspect of the superior articular process and the superior, posterior, and medial edge of the transverse process of L5. Under fluoroscopic guidance, a Quincke needle was inserted until contact was made with os over the superior postero-lateral aspect of the pedicular shadow (target area). After negative aspiration for blood, 0.5 mL of the nerve block solution was injected without difficulty or complication. The needle was removed intact. L5 Medial Branch Nerve Block (MBB): The target area for the L5 medial branch is at the junction of the postero-lateral aspect of the superior articular process and the superior, posterior, and medial edge of the sacral ala. Under fluoroscopic guidance, a Quincke needle was inserted until contact was made with os over the superior postero-lateral aspect of the pedicular shadow (target area). After negative aspiration for blood, 0.5 mL of the nerve block solution was injected without difficulty or complication. The needle was removed intact. S1 Medial Branch Nerve Block (MBB): The target area for the S1 medial branch is at the posterior and inferior 6 o'clock position of the L5-S1 facet joint. Under fluoroscopic guidance, the Quincke needle inserted for the L5 MBB was redirected until contact was made with os over the inferior and postero aspect of the sacrum, at the 6 o' clock position under the L5-S1 facet joint  (Target area). After negative aspiration for blood, 0.5 mL of the nerve block solution was injected without difficulty or complication. The needle was removed intact.  Nerve block solution: 0.2% PF-Ropivacaine + Triamcinolone (40 mg/mL) diluted  to a final concentration of 4 mg of Triamcinolone/mL of Ropivacaine The unused portion of the solution was discarded in the proper designated containers. Procedural Needles: 22-gauge, 5-inch, Quincke needles used for all levels.  Once the entire procedure was completed, the treated area was cleaned, making sure to leave some of the prepping solution back to take advantage of its long term bactericidal properties.   Illustration of the posterior view of the lumbar spine and the posterior neural structures. Laminae of L2 through S1 are labeled. DPRL5, dorsal primary ramus of L5; DPRS1, dorsal primary ramus of S1; DPR3, dorsal primary ramus of L3; FJ, facet (zygapophyseal) joint L3-L4; I, inferior articular process of L4; LB1, lateral branch of dorsal primary ramus of L1; IAB, inferior articular branches from L3 medial branch (supplies L4-L5 facet joint); IBP, intermediate branch plexus; MB3, medial branch of dorsal primary ramus of L3; NR3, third lumbar nerve root; S, superior articular process of L5; SAB, superior articular branches from L4 (supplies L4-5 facet joint also); TP3, transverse process of L3.  Vitals:   08/10/20 1025 08/10/20 1030 08/10/20 1040 08/10/20 1050  BP: 105/78 100/68 104/65 106/64  Pulse:      Resp: 18 16 12 14   Temp:   98.1 F (36.7 C)   SpO2: 95% 95% 96% 97%  Weight:      Height:         Start Time: 1022 hrs. End Time: 1030 hrs.  Imaging Guidance (Spinal):          Type of Imaging Technique: Fluoroscopy Guidance (Spinal) Indication(s): Assistance in needle guidance and placement for procedures requiring needle placement in or near specific anatomical locations not easily accessible without such assistance. Exposure Time:  Please see nurses notes. Contrast: None used. Fluoroscopic Guidance: I was personally present during the use of fluoroscopy. "Tunnel Vision Technique" used to obtain the best possible view of the target area. Parallax error corrected before commencing the procedure. "Direction-depth-direction" technique used to introduce the needle under continuous pulsed fluoroscopy. Once target was reached, antero-posterior, oblique, and lateral fluoroscopic projection used confirm needle placement in all planes. Images permanently stored in EMR. Interpretation: No contrast injected. I personally interpreted the imaging intraoperatively. Adequate needle placement confirmed in multiple planes. Permanent images saved into the patient's record.  Antibiotic Prophylaxis:   Anti-infectives (From admission, onward)   None     Indication(s): None identified  Post-operative Assessment:  Post-procedure Vital Signs:  Pulse/HCG Rate: (!) 5661 Temp: 98.1 F (36.7 C) Resp: 14 BP: 106/64 SpO2: 97 %  EBL: None  Complications: No immediate post-treatment complications observed by team, or reported by patient.  Note: The patient tolerated the entire procedure well. A repeat set of vitals were taken after the procedure and the patient was kept under observation following institutional policy, for this type of procedure. Post-procedural neurological assessment was performed, showing return to baseline, prior to discharge. The patient was provided with post-procedure discharge instructions, including a section on how to identify potential problems. Should any problems arise concerning this procedure, the patient was given instructions to immediately contact us, at any time, without hesitation. In any case, we plan to contact the patient by telephone for a follow-up status report regarding this interventional procedure.  Comments:  No additional relevant information.  Plan of Care  Orders:  Orders Placed This Encounter   Procedures  . LUMBAR FACET(MEDIAL BRANCH NERVE BLOCK) MBNB    Scheduling Instructions:     Procedure: Lumbar facet block (AKA.: Lumbosacral medial branch nerve  block)     Side: Bilateral     Level: L3-4, L4-5, & L5-S1 Facets (L2, L3, L4, L5, & S1 Medial Branch Nerves)     Sedation: Patient's choice.     Timeframe: Today    Order Specific Question:   Where will this procedure be performed?    Answer:   [facility]  . [facility] C-ARM 1-60 MIN NO REPORT    Intraoperative interpretation by procedural physician at [facility].    Standing Status:   Standing    Number of Occurrences:   1    Order Specific Question:   Reason for exam:    Answer:   Assistance in needle guidance and placement for procedures requiring needle placement in or near specific anatomical locations not easily accessible without such assistance.  . Informed Consent Details: Physician/Practitioner Attestation; Transcribe to consent form and obtain patient signature    Nursing Order: Transcribe to consent form and obtain patient signature. Note: Always confirm laterality of pain with Ms. Lorek, before procedure.    Order Specific Question:   Physician/Practitioner attestation of informed consent for procedure/surgical case    Answer:   I, the physician/practitioner, attest that I have discussed with the patient the benefits, risks, side effects, alternatives, likelihood of achieving goals and potential problems during recovery for the procedure that I have provided informed consent.    Order Specific Question:   Procedure    Answer:   Lumbar Facet Block  under fluoroscopic guidance    Order Specific Question:   Physician/Practitioner performing the procedure    Answer:   Kyon Bentler A. Laban Emperor MD    Order Specific Question:   Indication/Reason    Answer:   Low Back Pain, with our without leg pain, due to Facet Joint Arthralgia (Joint Pain) Spondylosis (Arthritis of the Spine), without myelopathy or  radiculopathy (Nerve Damage).  . Provide equipment / supplies at bedside    "Block Tray" (Disposable  single use) Needle type: SpinalSpinal Amount/quantity: 4 Size: Medium (5-inch) Gauge: 22G    Standing Status:   Standing    Number of Occurrences:   1    Order Specific Question:   Specify    Answer:   Block Tray   Chronic Opioid Analgesic:  Oxycodone IR 5 mg every 8 hours (15 mg/day of oxycodone) Highest recorded MME/day: 30 mg/day MME/day: 22.5 mg/day   Medications ordered for procedure: Meds ordered this encounter  Medications  . lidocaine (XYLOCAINE) 2 % (with pres) injection 400 mg  . lactated ringers infusion 1,000 mL  . midazolam (VERSED) 5 MG/5ML injection 1-2 mg    Make sure Flumazenil is available in the pyxis when using this medication. If oversedation occurs, administer 0.2 mg IV over 15 sec. If after 45 sec no response, administer 0.2 mg again over 1 min; may repeat at 1 min intervals; not to exceed 4 doses (1 mg)  . fentaNYL (SUBLIMAZE) injection 25-50 mcg    Make sure Narcan is available in the pyxis when using this medication. In the event of respiratory depression (RR< 8/min): Titrate NARCAN (naloxone) in increments of 0.1 to 0.2 mg IV at 2-3 minute intervals, until desired degree of reversal.  . ropivacaine (PF) 2 mg/mL (0.2%) (NAROPIN) injection 18 mL  . triamcinolone acetonide (KENALOG-40) injection 80 mg   Medications administered: We administered lidocaine, lactated ringers, midazolam, fentaNYL, ropivacaine (PF) 2 mg/mL (0.2%), and triamcinolone acetonide.  See the medical record for exact dosing, route, and time of administration.  Follow-up plan:   Return in about 2 weeks (around 08/24/2020) for (F2F), (PP) Follow-up.       Interventional management options: Planned, scheduled, and/or pending:    Diagnostic bilateral lumbar facet block #1    Considering:   Diagnostic left lumbar ESI  Diagnostic bilateral lumbar facet block  Possible bilateral lumbar  facet RFA  Diagnostic bilateral intra-articular knee joint injection  Possible bilateral Hyalgan knee injections  Diagnostic bilateral genicular nerve block  Possible bilateral genicular nerve RFA  Diagnostic left cervical ESI  Diagnostic bilateral cervical facet block  Possible bilateral cervical facet RFA    PRN Procedures:   None at this time    Recent Visits Date Type Provider Dept  08/04/20 Office Visit Delano Metz, MD [facility]  Showing recent visits within past 90 days and meeting all other requirements Today's Visits Date Type Provider Dept  08/10/20 Procedure visit Delano Metz, MD [facility]  Showing today's visits and meeting all other requirements Future Appointments Date Type Provider Dept  08/24/20 Appointment Delano Metz, MD [facility]  Showing future appointments within next 90 days and meeting all other requirements  Disposition: Discharge home  Discharge (Date  Time): 08/10/2020; 1154 hrs.   Primary Care Physician: No primary care provider on file. Location: [facility] Note by: Oswaldo Done, MD Date: 08/10/2020; Time: 12:28 PM  Disclaimer:  Medicine is not an Visual merchandiser. The only guarantee in medicine is that nothing is guaranteed. It is important to note that the decision to proceed with this intervention was based on the information collected from the patient. The Data and conclusions were drawn from the patient's questionnaire, the interview, and the physical examination. Because the information was provided in large part by the patient, it cannot be guaranteed that it has not been purposely or unconsciously manipulated. Every effort has been made to obtain as much relevant data as possible for this evaluation. It is important to note that the conclusions that lead to this procedure are derived in large part from the available data. Always take into account that the  treatment will also be dependent on availability of resources and existing treatment guidelines, considered by other Pain Management Practitioners as being common knowledge and practice, at the time of the intervention. For Medico-Legal purposes, it is also important to point out that variation in procedural techniques and pharmacological choices are the acceptable norm. The indications, contraindications, technique, and results of the above procedure should only be interpreted and judged by a Board-Certified Interventional Pain Specialist with extensive familiarity and expertise in the same exact procedure and technique.

## 2020-08-10 NOTE — Patient Instructions (Signed)

## 2020-08-11 ENCOUNTER — Telehealth: Payer: Self-pay

## 2020-08-11 NOTE — Telephone Encounter (Signed)
Post procedure follow up call.  Patient states she is doing good.  

## 2020-08-18 ENCOUNTER — Telehealth: Payer: Self-pay | Admitting: Pharmacist

## 2020-08-18 NOTE — Telephone Encounter (Signed)
Patient failed to provide requested 2022 financial documentation. Unable to determine patient's eligibility status. No additional medication assistance will be provided by [facility] without the required proof of income documentation. Patient notified by letter.  Vonda Henderson [facility] Administrative Assistant 

## 2020-08-21 NOTE — Progress Notes (Deleted)
The patient called about an hour prior to her visit and rescheduled.

## 2020-08-24 ENCOUNTER — Ambulatory Visit (HOSPITAL_BASED_OUTPATIENT_CLINIC_OR_DEPARTMENT_OTHER): Payer: Self-pay | Admitting: Pain Medicine

## 2020-08-24 DIAGNOSIS — Z5329 Procedure and treatment not carried out because of patient's decision for other reasons: Secondary | ICD-10-CM

## 2020-08-29 IMAGING — MR MR HEAD W/O CM
12 series · 46 of 48 positions shown · non-contrast
Comparison: None.

CLINICAL DATA: Worsening tremor.  Headache

EXAM:
MRI HEAD WITHOUT CONTRAST
TECHNIQUE: Multiplanar, multiecho pulse sequences of the brain and surrounding
structures were obtained without intravenous contrast.

[Series 5: ax dwi_tracew · axial · 3.0mm · 0.60mm/px · z∈[-102,+52]mm · 4 of 48 slices shown]
[im 1/48]
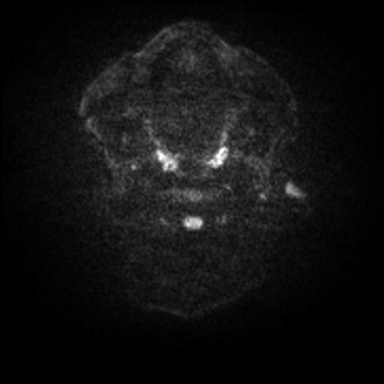
[im 16/48]
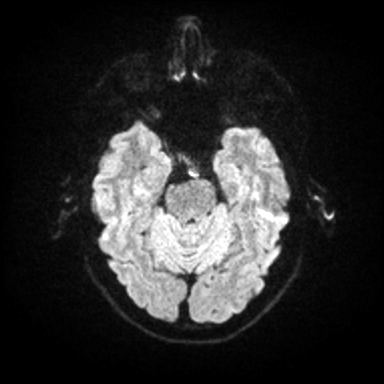
[im 32/48]
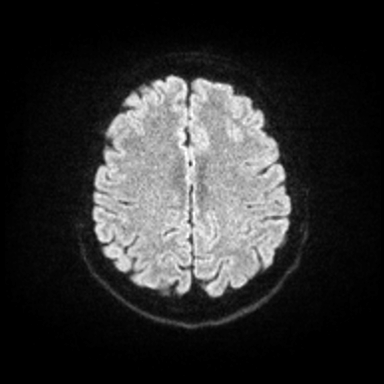
[im 48/48]
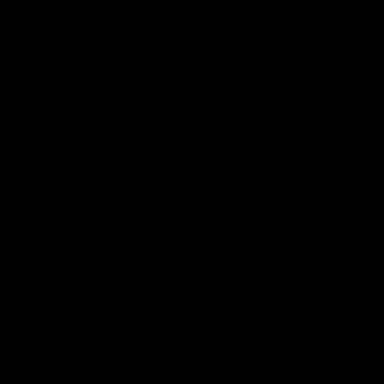

[Series 6: ax dwi_adc · axial · 3.0mm · 0.60mm/px · z∈[-102,+49]mm · 3 of 47 slices shown]
[im 1/47]
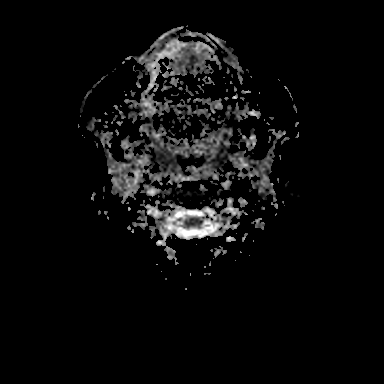
[im 24/47]
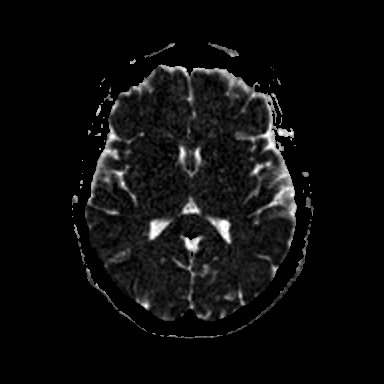
[im 47/47]
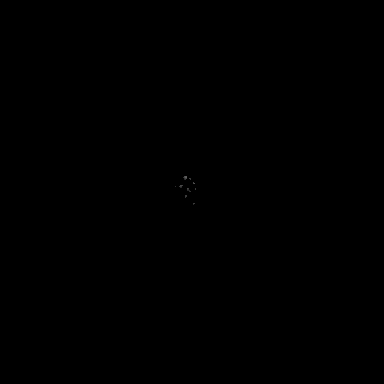

[Series 7: cor dwi_tracew · coronal · 5.0mm · 0.60mm/px · 3 of 38 slices shown]
[im 1/38]
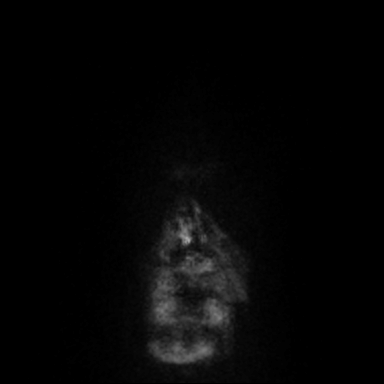
[im 19/38]
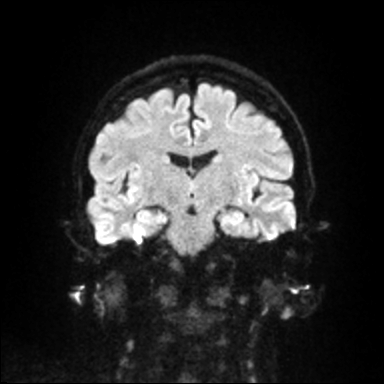
[im 38/38]
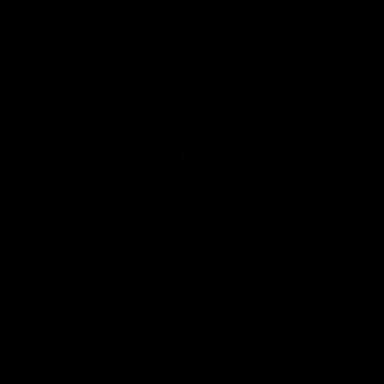

[Series 8: cor dwi_adc · coronal · 5.0mm · 0.60mm/px · 3 of 36 slices shown]
[im 1/36]
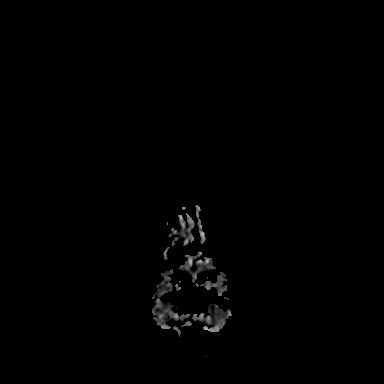
[im 18/36]
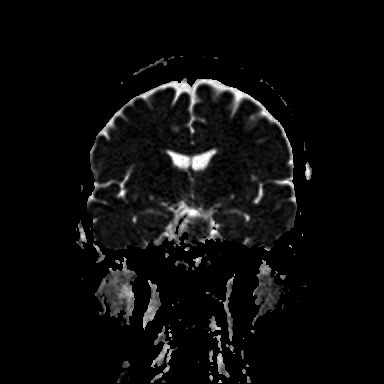
[im 36/36]
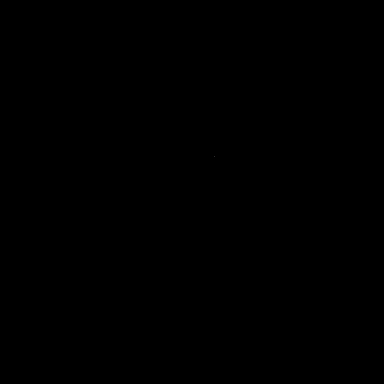

[Series 9: T1 · sagittal · 5.0mm · 0.62mm/px · 2 of 23 slices shown (1 of 2)]
[im 1/23]
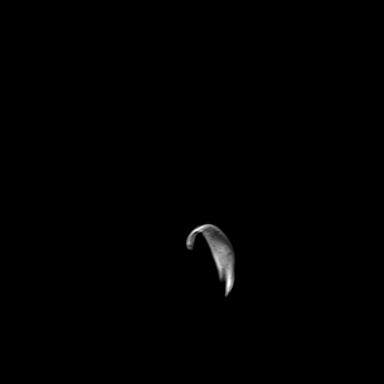
[im 23/23]
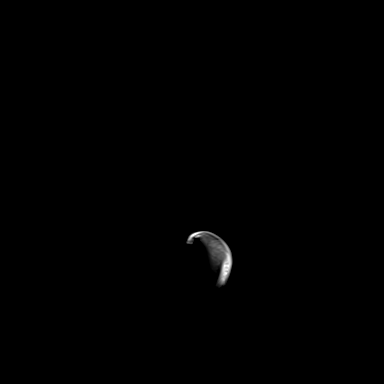

[Series 10: T2 · axial · 5.0mm · 0.53mm/px · z∈[-96,+47]mm · 2 of 25 slices shown (1 of 2)]
[im 1/25]
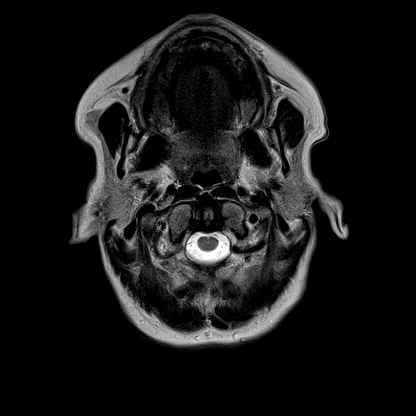
[im 25/25]
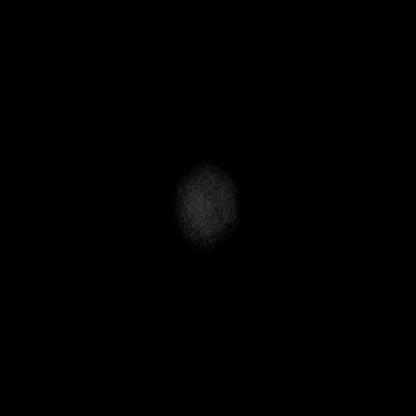

[Series 11: mag_images · axial · 3.0mm · 0.90mm/px · z∈[-112,+64]mm · 4 of 60 slices shown]
[im 1/60]
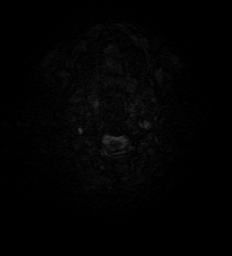
[im 20/60]
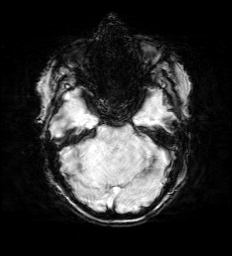
[im 40/60]
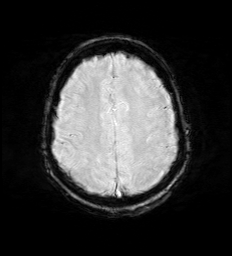
[im 60/60]
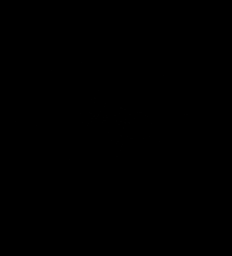

[Series 12: pha_images · axial · 3.0mm · 0.90mm/px · z∈[-112,+64]mm · 4 of 59 slices shown]
[im 1/59]
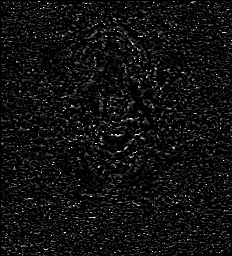
[im 20/59]
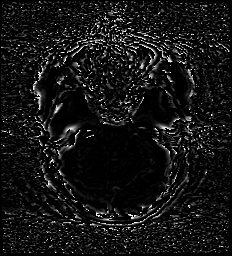
[im 39/59]
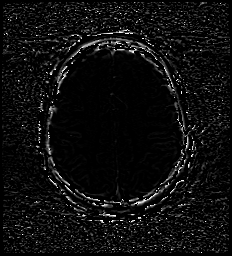
[im 59/59]
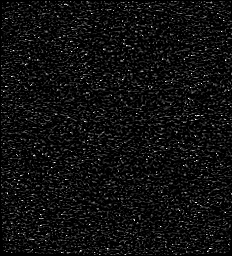

[Series 13: swi_images · axial · 3.0mm · 0.90mm/px · z∈[-112,+64]mm · 4 of 60 slices shown]
[im 1/60]
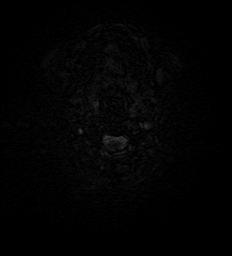
[im 20/60]
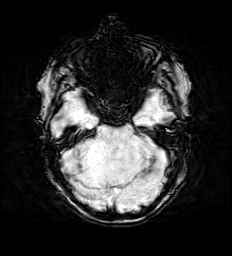
[im 40/60]
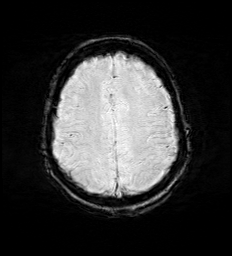
[im 60/60]
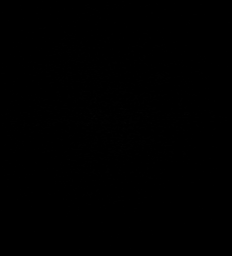

[Series 15: FLAIR · axial · 3.0mm · 0.53mm/px · z∈[-105,+56]mm · 4 of 55 slices shown]
[im 1/55]
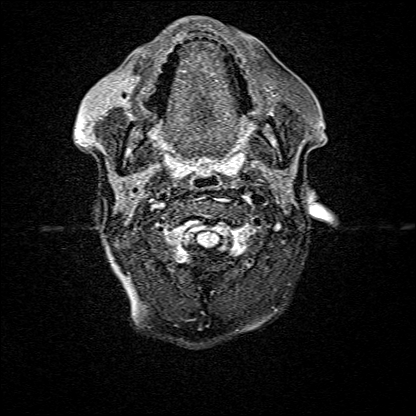
[im 19/55]
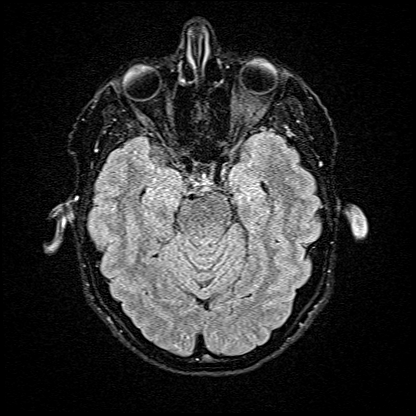
[im 37/55]
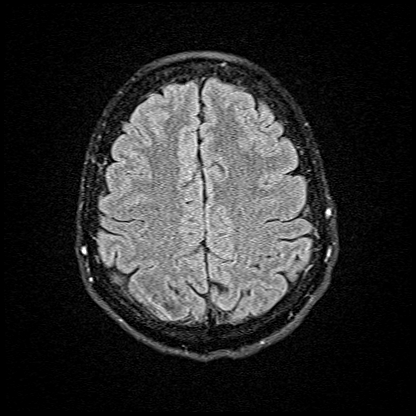
[im 55/55]
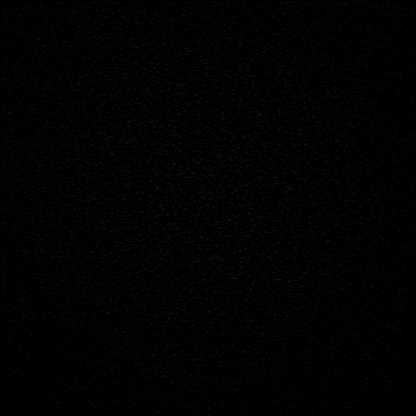

[Series 16: T1 · axial · 1.0mm · 0.98mm/px · z∈[-112,+62]mm · 11 of 176 slices shown (2 of 2)]
[im 1/176]
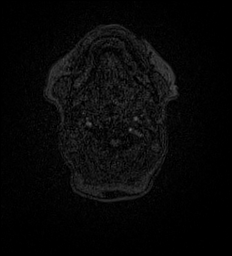
[im 15/176]
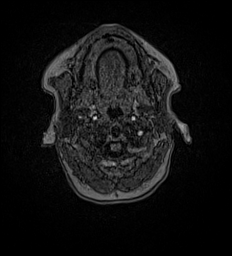
[im 30/176]
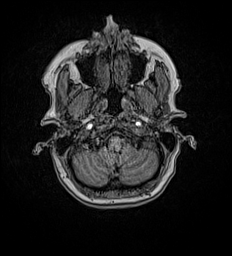
[im 44/176]
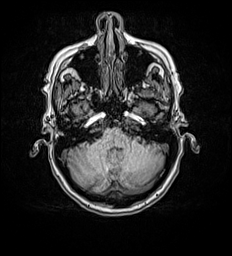
[im 59/176]
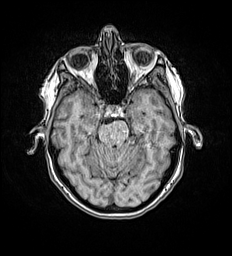
[im 73/176]
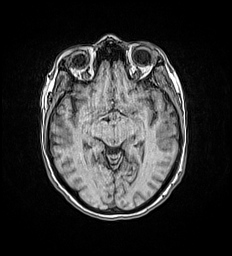
[im 88/176]
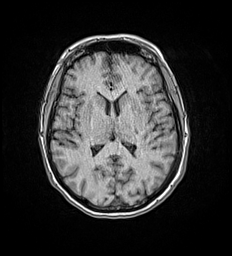
[im 103/176]
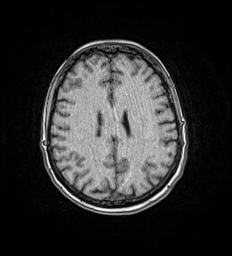
[im 117/176]
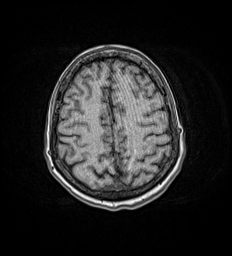
[im 146/176]
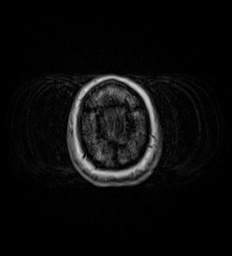
[im 176/176]
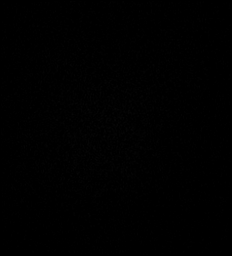

[Series 17: T2 · coronal · 5.0mm · 0.57mm/px · 2 of 29 slices shown (2 of 2)]
[im 1/29]
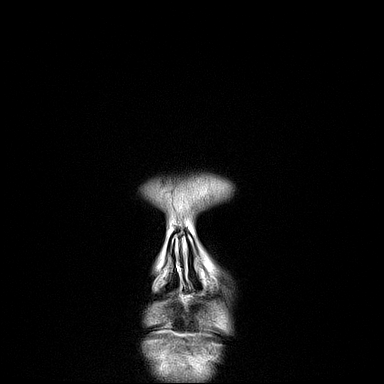
[im 29/29]
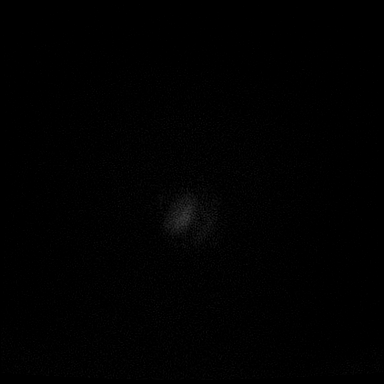

[46 of 48 positions shown; findings below may reference images not displayed]

FINDINGS: Brain: No acute infarction, hemorrhage, hydrocephalus, extra-axial
collection or mass lesion. Normal white matter.

Vascular: Normal arterial flow voids.

Skull and upper cervical spine: Negative

Sinuses/Orbits: Negative

Other: None
IMPRESSION: Negative MRI brain.

## 2020-09-08 NOTE — Progress Notes (Deleted)
Appointment rescheduled by patient

## 2020-09-13 ENCOUNTER — Ambulatory Visit: Payer: Self-pay | Admitting: Pain Medicine

## 2020-09-16 ENCOUNTER — Other Ambulatory Visit: Payer: Self-pay

## 2020-09-21 ENCOUNTER — Other Ambulatory Visit: Payer: Self-pay

## 2020-09-21 MED FILL — Topiramate Tab 50 MG: ORAL | 30 days supply | Qty: 60 | Fill #0 | Status: AC

## 2020-09-21 MED FILL — Gabapentin Cap 300 MG: ORAL | 180 days supply | Qty: 180 | Fill #0 | Status: AC

## 2020-09-22 ENCOUNTER — Other Ambulatory Visit: Payer: Self-pay

## 2020-09-23 ENCOUNTER — Other Ambulatory Visit: Payer: Self-pay

## 2020-10-01 ENCOUNTER — Telehealth: Payer: Self-pay | Admitting: Pharmacist

## 2020-10-01 ENCOUNTER — Other Ambulatory Visit: Payer: Self-pay

## 2020-10-01 NOTE — Telephone Encounter (Signed)
Patient [facility] elig. Till 08/10/2021.aj

## 2020-10-08 ENCOUNTER — Other Ambulatory Visit: Payer: Self-pay

## 2020-10-11 ENCOUNTER — Other Ambulatory Visit: Payer: Self-pay

## 2020-10-11 MED FILL — Albuterol Sulfate Inhal Aero 108 MCG/ACT (90MCG Base Equiv): RESPIRATORY_TRACT | 45 days supply | Qty: 25.5 | Fill #0 | Status: AC

## 2020-10-21 ENCOUNTER — Other Ambulatory Visit: Payer: Self-pay

## 2020-10-21 MED ORDER — PROPRANOLOL HCL 20 MG PO TABS
ORAL_TABLET | ORAL | 3 refills | Status: DC
  Filled 2020-10-21: qty 30, 15d supply, fill #0
  Filled 2020-11-04: qty 60, 30d supply, fill #1

## 2020-10-22 ENCOUNTER — Other Ambulatory Visit: Payer: Self-pay

## 2020-10-22 MED ORDER — MELOXICAM 15 MG PO TABS
15.0000 mg | ORAL_TABLET | Freq: Every day | ORAL | 2 refills | Status: DC | PRN
  Filled 2020-10-22: qty 30, 30d supply, fill #0

## 2020-10-22 MED FILL — Gabapentin Cap 300 MG: ORAL | 30 days supply | Qty: 180 | Fill #1 | Status: AC

## 2020-10-25 ENCOUNTER — Other Ambulatory Visit: Payer: Self-pay

## 2020-10-25 MED ORDER — GABAPENTIN 300 MG PO CAPS
ORAL_CAPSULE | ORAL | 11 refills | Status: DC
  Filled 2020-11-16 – 2020-11-18 (×2): qty 180, 30d supply, fill #0
  Filled 2020-12-20: qty 180, 30d supply, fill #1
  Filled ????-??-??: fill #1

## 2020-10-28 ENCOUNTER — Other Ambulatory Visit: Payer: Self-pay

## 2020-10-29 ENCOUNTER — Other Ambulatory Visit: Payer: Self-pay

## 2020-10-29 MED ORDER — BACLOFEN 10 MG PO TABS
10.0000 mg | ORAL_TABLET | ORAL | 0 refills | Status: DC
  Filled 2020-10-29: qty 30, 30d supply, fill #0
  Filled 2020-11-24: qty 30, 30d supply, fill #1
  Filled 2020-12-30: qty 30, 30d supply, fill #2

## 2020-11-02 ENCOUNTER — Other Ambulatory Visit: Payer: Self-pay

## 2020-11-02 MED ORDER — ESCITALOPRAM OXALATE 10 MG PO TABS
10.0000 mg | ORAL_TABLET | Freq: Every day | ORAL | 4 refills | Status: DC
  Filled 2020-11-02: qty 30, 30d supply, fill #0
  Filled 2020-12-30: qty 30, 30d supply, fill #1

## 2020-11-02 MED ORDER — TOPIRAMATE 50 MG PO TABS
50.0000 mg | ORAL_TABLET | Freq: Every day | ORAL | 1 refills | Status: DC
  Filled 2020-11-02: qty 30, 30d supply, fill #0

## 2020-11-02 MED ORDER — ALBUTEROL SULFATE HFA 108 (90 BASE) MCG/ACT IN AERS: INHALATION_SPRAY | RESPIRATORY_TRACT | 0 refills | Status: DC

## 2020-11-04 ENCOUNTER — Other Ambulatory Visit: Payer: Self-pay

## 2020-11-05 ENCOUNTER — Other Ambulatory Visit: Payer: Self-pay

## 2020-11-14 DIAGNOSIS — Z79891 Long term (current) use of opiate analgesic: Secondary | ICD-10-CM | POA: Insufficient documentation

## 2020-11-14 NOTE — Progress Notes (Signed)
PROVIDER NOTE: Information contained herein reflects review and annotations entered in association with encounter. Interpretation of such information and data should be left to medically-trained personnel. Information provided to patient can be located elsewhere in the medical record under "Patient Instructions". Document created using STT-dictation technology, any transcriptional errors that may result from process are unintentional.    Patient: Natasha Hanson  Service Category: E/M  Provider: Gaspar Cola, MD  DOB: [birth year]  DOS: 11/15/2020  Specialty: Interventional Pain Management  MRN: [id number]  Setting: Ambulatory outpatient  PCP: No primary care provider on file.  Type: Established Patient    Referring Provider: No ref. provider found  Location: Office  Delivery: Face-to-face     HPI  Natasha Hanson, a 55 y.o. year old female, is here today because of her Chronic pain syndrome [G89.4]. Natasha Hanson primary complain today is Knee Pain (bilateral) and Back Pain Last encounter: My last encounter with her was on 09/13/2020. Pertinent problems: Natasha Hanson has Chronic pain syndrome; Cervicalgia; DDD (degenerative disc disease), cervical; Cervical facet hypertrophy; Cervical facet syndrome; Cervical central spinal stenosis; Abnormal MRI, cervical spine (02/20/2019); Cervical foraminal stenosis; Chronic low back pain (1ry area of Pain) (Bilateral) (Midline) (L>R) w/o sciatica; Chronic knee pain (2ry area of Pain) (Bilateral) (L>R); Chronic neck pain (3ry area of Pain) (Bilateral) (L>R); DDD (degenerative disc disease), lumbosacral; Lumbar facet hypertrophy (Multilevel) (Bilateral); Lumbar facet syndrome (Bilateral); Osteoarthritis of knee (Left); Osteoarthritis of knee (Right); Osteoarthritis of the knees (Bilateral); Chronic sacroiliac joint pain (Bilateral); Somatic dysfunction of sacroiliac joints (Bilateral); Other spondylosis, sacral and sacrococcygeal region; Spondylosis without myelopathy or  radiculopathy, lumbosacral region; Chronic hip pain (Bilateral); Neurogenic pain; Chronic musculoskeletal pain; Arm numbness; Other intervertebral disc degeneration, lumbar region; Arthritis of knee; and Muscle cramps on their pertinent problem list. Pain Assessment: Severity of Chronic pain is reported as a 9 /10. Location: Knee Right,Left/ . Onset: More than a month ago. Quality: Throbbing,Aching. Timing: Constant. Modifying factor(s): medications. Vitals:  height is 5' 3"  (1.6 m) and weight is 131 lb (59.4 kg). Her temporal temperature is 98.1 F (36.7 C). Her blood pressure is 119/70 and her pulse is 47 (abnormal). Her respiration is 14 and oxygen saturation is 100%.   Reason for encounter: medication management.   The patient indicates doing well with the current medication regimen. No adverse reactions or side effects reported to the medications.  Patient refers having run out of pills early.  RTCB: 02/13/2021 Nonopioids transferred 05/10/2020: Magnesium, Robaxin, Neurontin, turmeric, and Mobic.  Pharmacotherapy Assessment   Analgesic: Oxycodone IR 5 mg every 8 hours (15 mg/day of oxycodone) Highest recorded MME/day: 30 mg/day MME/day: 22.5 mg/day   Monitoring: [place] PMP: PDMP reviewed during this encounter.       Pharmacotherapy: No side-effects or adverse reactions reported. Compliance: No problems identified. Effectiveness: Clinically acceptable.  Landis Martins, RN  11/15/2020  2:36 PM  Sign when Signing Visit Nursing Pain Medication Assessment:  Safety precautions to be maintained throughout the outpatient stay will include: orient to surroundings, keep bed in low position, maintain call bell within reach at all times, provide assistance with transfer out of bed and ambulation.  Medication Inspection Compliance: Pill count conducted under aseptic conditions, in front of the patient. Neither the pills nor the bottle was removed from the patient's sight at any time. Once count was  completed pills were immediately returned to the patient in their original bottle.  Medication: Oxycodone IR Pill/Patch Count: 0 of 90 pills remain Pill/Patch  Appearance: Markings consistent with prescribed medication Bottle Appearance: Standard pharmacy container. Clearly labeled. Filled Date: 10/16/2020 Last Medication intake:  Today    UDS:  Summary  Date Value Ref Range Status  11/06/2019 Note  Final    Comment:    ==================================================================== [employer] Select 13 (MW) ==================================================================== Test                             Result       Flag       Units Drug Present and Declared for Prescription Verification   Oxycodone                      942          EXPECTED   ng/mg creat   Oxymorphone                    1903         EXPECTED   ng/mg creat   Noroxycodone                   3335         EXPECTED   ng/mg creat   Noroxymorphone                 1689         EXPECTED   ng/mg creat    Sources of oxycodone are scheduled prescription medications.    Oxymorphone, noroxycodone, and noroxymorphone are expected    metabolites of oxycodone. Oxymorphone is also available as a    scheduled prescription medication. ==================================================================== Test                      Result    Flag   Units      Ref Range   Creatinine              117              mg/dL      >=20 ==================================================================== Declared Medications:  The flagging and interpretation on this report are based on the  following declared medications.  Unexpected results may arise from  inaccuracies in the declared medications.  **Note: The testing scope of this panel includes these medications:  Oxycodone  **Note: The testing scope of this panel does not include the  following reported medications:  Albuterol (Ventolin HFA)  Gabapentin (Neurontin)  Magnesium   Meloxicam (Mobic)  Methocarbamol (Robaxin)  Propranolol (Inderal)  Turmeric ==================================================================== For clinical consultation, please call [phone]. ====================================================================      ROS  Constitutional: Denies any fever or chills Gastrointestinal: No reported hemesis, hematochezia, vomiting, or acute GI distress Musculoskeletal: Denies any acute onset joint swelling, redness, loss of ROM, or weakness Neurological: No reported episodes of acute onset apraxia, aphasia, dysarthria, agnosia, amnesia, paralysis, loss of coordination, or loss of consciousness  Medication Review  Magnesium Oxide, Turmeric, albuterol, baclofen, escitalopram, gabapentin, methocarbamol, oxyCODONE, propranolol, sucralfate, and topiramate  History Review  Allergy: Natasha Hanson has No Known Allergies. Drug: Natasha Hanson  has no history on file for drug use. Alcohol:  reports no history of alcohol use. Tobacco:  reports that she has been smoking. She has never used smokeless tobacco. Social: Natasha Hanson  reports that she has been smoking. She has never used smokeless tobacco. She reports that she does not drink alcohol. Medical:  has a past medical history of Allergy, Arthritis, COPD (  chronic obstructive pulmonary disease) ([place]), and Glaucoma. Surgical: Natasha Hanson  has a past surgical history that includes Refractive surgery; Abdominal hysterectomy; Foot surgery; and Foot Fusion. Family: family history is not on file.  Laboratory Chemistry Profile   Renal Lab Results  Component Value Date   BUN 15 12/30/2019   CREATININE 0.73 12/30/2019   BCR 12 09/09/2019   GFRAA >60 12/30/2019   GFRNONAA >60 12/30/2019     Hepatic Lab Results  Component Value Date   AST 15 12/30/2019   ALT 13 12/30/2019   ALBUMIN 3.9 12/30/2019   ALKPHOS 67 12/30/2019   LIPASE 20 12/30/2019     Electrolytes Lab Results  Component Value Date    NA 138 12/30/2019   K 3.9 12/30/2019   CL 105 12/30/2019   CALCIUM 8.9 12/30/2019   MG 1.8 09/09/2019     Bone Lab Results  Component Value Date   25OHVITD1 33 09/09/2019   25OHVITD2 <1.0 09/09/2019   25OHVITD3 33 09/09/2019     Inflammation (CRP: Acute Phase) (ESR: Chronic Phase) Lab Results  Component Value Date   CRP 2 09/09/2019   ESRSEDRATE 7 09/09/2019       Note: Above Lab results reviewed.  Recent Imaging Review  DG PAIN [facility] C-ARM 1-60 MIN NO REPORT Fluoro was used, but no Radiologist interpretation will be provided.  Please refer to "NOTES" tab for provider progress note. Note: Reviewed        Physical Exam  General appearance: Well nourished, well developed, and well hydrated. In no apparent acute distress Mental status: Alert, oriented x 3 (person, place, & time)       Respiratory: No evidence of acute respiratory distress Eyes: PERLA Vitals: BP 119/70   Pulse (!) 47   Temp 98.1 F (36.7 C) (Temporal)   Resp 14   Ht 5' 3"  (1.6 m)   Wt 131 lb (59.4 kg)   SpO2 100%   BMI 23.21 kg/m  BMI: Estimated body mass index is 23.21 kg/m as calculated from the following:   Height as of this encounter: 5' 3"  (1.6 m).   Weight as of this encounter: 131 lb (59.4 kg). Ideal: Ideal body weight: 52.4 kg (115 lb 8.3 oz) Adjusted ideal body weight: 55.2 kg (121 lb 11.4 oz)  Assessment   Status Diagnosis  Controlled Controlled Controlled 1. Chronic pain syndrome   2. Chronic low back pain (1ry area of Pain) (Bilateral) (Midline) (L>R) w/o sciatica   3. Chronic knee pain (2ry area of Pain) (Bilateral) (L>R)   4. Chronic neck pain (3ry area of Pain) (Bilateral) (L>R)   5. Pharmacologic therapy   6. Chronic use of opiate for therapeutic purpose      Updated Problems: No problems updated.  Plan of Care  Problem-specific:  No problem-specific Assessment & Plan notes found for this encounter.  Natasha Hanson has a current medication list which includes the  following long-term medication(s): albuterol, escitalopram, gabapentin, gabapentin, magnesium oxide, methocarbamol, oxycodone, [START ON 12/15/2020] oxycodone, [START ON 01/14/2021] oxycodone, proair hfa, propranolol, sucralfate, topiramate, and turmeric.  Pharmacotherapy (Medications Ordered): Meds ordered this encounter  Medications  . oxyCODONE (OXY IR/ROXICODONE) 5 MG immediate release tablet    Sig: Take 1 tablet (5 mg total) by mouth every 8 (eight) hours as needed for severe pain. Must last 30 days.    Dispense:  90 tablet    Refill:  0    Not a duplicate. Do NOT delete! Dispense 1 day early if  closed on refill date. Avoid benzodiazepines within 8 hours of opioids. Do not send refill requests.  Marland Kitchen oxyCODONE (OXY IR/ROXICODONE) 5 MG immediate release tablet    Sig: Take 1 tablet (5 mg total) by mouth every 8 (eight) hours as needed for severe pain. Must last 30 days.    Dispense:  90 tablet    Refill:  0    Not a duplicate. Do NOT delete! Dispense 1 day early if closed on refill date. Avoid benzodiazepines within 8 hours of opioids. Do not send refill requests.  Marland Kitchen oxyCODONE (OXY IR/ROXICODONE) 5 MG immediate release tablet    Sig: Take 1 tablet (5 mg total) by mouth every 8 (eight) hours as needed for severe pain. Must last 30 days.    Dispense:  90 tablet    Refill:  0    Not a duplicate. Do NOT delete! Dispense 1 day early if closed on refill date. Avoid benzodiazepines within 8 hours of opioids. Do not send refill requests.   Orders:  Orders Placed This Encounter  Procedures  . KNEE INJECTION    Local Anesthetic & Steroid injection.    Standing Status:   Future    Standing Expiration Date:   02/15/2021    Scheduling Instructions:     Side: Bilateral     Sedation: None     Timeframe: As soon as schedule allows    Order Specific Question:   Where will this procedure be performed?    Answer:   [facility]  . [employer] Select 13 (MW), Urine    Volume: 30 ml(s). Minimum 3 ml  of urine is needed. Document temperature of fresh sample. Indications: Long term (current) use of opiate analgesic (Y86.578)    Order Specific Question:   Release to patient    Answer:   Immediate   Follow-up plan:   Return in about 3 months (around 02/13/2021) for evaluation day (F2F) (MM), in addition, Procedure (no sedation): (B) IA Knee inj. #1 ([place]).      Interventional management options: Planned, scheduled, and/or pending:    Diagnostic bilateral lumbar facet block #1    Considering:   Diagnostic left lumbar ESI  Diagnostic bilateral lumbar facet block  Possible bilateral lumbar facet RFA  Diagnostic bilateral intra-articular knee joint injection  Possible bilateral Hyalgan knee injections  Diagnostic bilateral genicular nerve block  Possible bilateral genicular nerve RFA  Diagnostic left cervical ESI  Diagnostic bilateral cervical facet block  Possible bilateral cervical facet RFA    PRN Procedures:   None at this time    Recent Visits No visits were found meeting these conditions. Showing recent visits within past 90 days and meeting all other requirements Today's Visits Date Type Provider Dept  11/15/20 Office Visit Milinda Pointer, MD [facility]  Showing today's visits and meeting all other requirements Future Appointments No visits were found meeting these conditions. Showing future appointments within next 90 days and meeting all other requirements  I discussed the assessment and treatment plan with the patient. The patient was provided an opportunity to ask questions and all were answered. The patient agreed with the plan and demonstrated an understanding of the instructions.  Patient advised to call back or seek an in-person evaluation if the symptoms or condition worsens.  Duration of encounter: 30 minutes.  Note by: Gaspar Cola, MD Date: 11/15/2020; Time: 3:24 PM

## 2020-11-15 ENCOUNTER — Ambulatory Visit: Payer: Self-pay | Attending: Pain Medicine | Admitting: Pain Medicine

## 2020-11-15 ENCOUNTER — Other Ambulatory Visit: Payer: Self-pay

## 2020-11-15 ENCOUNTER — Encounter: Payer: Self-pay | Admitting: Pain Medicine

## 2020-11-15 VITALS — BP 119/70 | HR 47 | Temp 98.1°F | Resp 14 | Ht 63.0 in | Wt 131.0 lb

## 2020-11-15 DIAGNOSIS — M542 Cervicalgia: Secondary | ICD-10-CM

## 2020-11-15 DIAGNOSIS — G8929 Other chronic pain: Secondary | ICD-10-CM

## 2020-11-15 DIAGNOSIS — Z79891 Long term (current) use of opiate analgesic: Secondary | ICD-10-CM

## 2020-11-15 DIAGNOSIS — G894 Chronic pain syndrome: Secondary | ICD-10-CM

## 2020-11-15 DIAGNOSIS — M545 Low back pain, unspecified: Secondary | ICD-10-CM

## 2020-11-15 DIAGNOSIS — M25561 Pain in right knee: Secondary | ICD-10-CM

## 2020-11-15 DIAGNOSIS — M25562 Pain in left knee: Secondary | ICD-10-CM

## 2020-11-15 DIAGNOSIS — Z79899 Other long term (current) drug therapy: Secondary | ICD-10-CM

## 2020-11-15 MED ORDER — OXYCODONE HCL 5 MG PO TABS: 5.0000 mg | ORAL_TABLET | Freq: Three times a day (TID) | ORAL | 0 refills | Status: DC | PRN

## 2020-11-15 NOTE — Progress Notes (Signed)
Nursing Pain Medication Assessment:  Safety precautions to be maintained throughout the outpatient stay will include: orient to surroundings, keep bed in low position, maintain call bell within reach at all times, provide assistance with transfer out of bed and ambulation.  Medication Inspection Compliance: Pill count conducted under aseptic conditions, in front of the patient. Neither the pills nor the bottle was removed from the patient's sight at any time. Once count was completed pills were immediately returned to the patient in their original bottle.  Medication: Oxycodone IR Pill/Patch Count: 0 of 90 pills remain Pill/Patch Appearance: Markings consistent with prescribed medication Bottle Appearance: Standard pharmacy container. Clearly labeled. Filled Date: 10/16/2020 Last Medication intake:  Today

## 2020-11-15 NOTE — Patient Instructions (Addendum)
____________________________________________________________________________________________  Preparing for your procedure (without sedation)  Procedure appointments are limited to planned procedures: . No Prescription Refills. . No disability issues will be discussed. . No medication changes will be discussed.  Instructions: . Oral Intake: Do not eat or drink anything for at least 6 hours prior to your procedure. (Exception: Blood Pressure Medication. See below.) . Transportation: Unless otherwise stated by your physician, you may drive yourself after the procedure. . Blood Pressure Medicine: Do not forget to take your blood pressure medicine with a sip of water the morning of the procedure. If your Diastolic (lower reading)is above 100 mmHg, elective cases will be cancelled/rescheduled. . Blood thinners: These will need to be stopped for procedures. Notify our staff if you are taking any blood thinners. Depending on which one you take, there will be specific instructions on how and when to stop it. . Diabetics on insulin: Notify the staff so that you can be scheduled 1st case in the morning. If your diabetes requires high dose insulin, take only  of your normal insulin dose the morning of the procedure and notify the staff that you have done so. . Preventing infections: Shower with an antibacterial soap the morning of your procedure.  . Build-up your immune system: Take 1000 mg of Vitamin C with every meal (3 times a day) the day prior to your procedure. . Antibiotics: Inform the staff if you have a condition or reason that requires you to take antibiotics before dental procedures. . Pregnancy: If you are pregnant, call and cancel the procedure. . Sickness: If you have a cold, fever, or any active infections, call and cancel the procedure. . Arrival: You must be in the facility at least 30 minutes prior to your scheduled procedure. . Children: Do not bring any children with you. . Dress  appropriately: Bring dark clothing that you would not mind if they get stained. . Valuables: Do not bring any jewelry or valuables.  Reasons to call and reschedule or cancel your procedure: (Following these recommendations will minimize the risk of a serious complication.) . Surgeries: Avoid having procedures within 2 weeks of any surgery. (Avoid for 2 weeks before or after any surgery). . Flu Shots: Avoid having procedures within 2 weeks of a flu shots or . (Avoid for 2 weeks before or after immunizations). . Barium: Avoid having a procedure within 7-10 days after having had a radiological study involving the use of radiological contrast. (Myelograms, Barium swallow or enema study). . Heart attacks: Avoid any elective procedures or surgeries for the initial 6 months after a "Myocardial Infarction" (Heart Attack). . Blood thinners: It is imperative that you stop these medications before procedures. Let us know if you if you take any blood thinner.  . Infection: Avoid procedures during or within two weeks of an infection (including chest colds or gastrointestinal problems). Symptoms associated with infections include: Localized redness, fever, chills, night sweats or profuse sweating, burning sensation when voiding, cough, congestion, stuffiness, runny nose, sore throat, diarrhea, nausea, vomiting, cold or Flu symptoms, recent or current infections. It is specially important if the infection is over the area that we intend to treat. . Heart and lung problems: Symptoms that may suggest an active cardiopulmonary problem include: cough, chest pain, breathing difficulties or shortness of breath, dizziness, ankle swelling, uncontrolled high or unusually low blood pressure, and/or palpitations. If you are experiencing any of these symptoms, cancel your procedure and contact your primary care physician for an evaluation.  Remember:  Regular   Business hours are:  Monday to Thursday 8:00 AM to 4:00  PM  Provider's Schedule: Milinda Pointer, MD:  Procedure days: Tuesday and Thursday 7:30 AM to 4:00 PM  Gillis Santa, MD:  Procedure days: Monday and Wednesday 7:30 AM to 4:00 PM ____________________________________________________________________________________________   ____________________________________________________________________________________________  General Risks and Possible Complications  Patient Responsibilities: It is important that you read this as it is part of your informed consent. It is our duty to inform you of the risks and possible complications associated with treatments offered to you. It is your responsibility as a patient to read this and to ask questions about anything that is not clear or that you believe was not covered in this document.  Patient's Rights: You have the right to refuse treatment. You also have the right to change your mind, even after initially having agreed to have the treatment done. However, under this last option, if you wait until the last second to change your mind, you may be charged for the materials used up to that point.  Introduction: Medicine is not an Chief Strategy Officer. Everything in Medicine, including the lack of treatment(s), carries the potential for danger, harm, or loss (which is by definition: Risk). In Medicine, a complication is a secondary problem, condition, or disease that can aggravate an already existing one. All treatments carry the risk of possible complications. The fact that a side effects or complications occurs, does not imply that the treatment was conducted incorrectly. It must be clearly understood that these can happen even when everything is done following the highest safety standards.  No treatment: You can choose not to proceed with the proposed treatment alternative. The "PRO(s)" would include: avoiding the risk of complications associated with the therapy. The "CON(s)" would include: not getting any of the  treatment benefits. These benefits fall under one of three categories: diagnostic; therapeutic; and/or palliative. Diagnostic benefits include: getting information which can ultimately lead to improvement of the disease or symptom(s). Therapeutic benefits are those associated with the successful treatment of the disease. Finally, palliative benefits are those related to the decrease of the primary symptoms, without necessarily curing the condition (example: decreasing the pain from a flare-up of a chronic condition, such as incurable terminal cancer).  General Risks and Complications: These are associated to most interventional treatments. They can occur alone, or in combination. They fall under one of the following six (6) categories: no benefit or worsening of symptoms; bleeding; infection; nerve damage; allergic reactions; and/or death. 1. No benefits or worsening of symptoms: In Medicine there are no guarantees, only probabilities. No healthcare provider can ever guarantee that a medical treatment will work, they can only state the probability that it may. Furthermore, there is always the possibility that the condition may worsen, either directly, or indirectly, as a consequence of the treatment. 2. Bleeding: This is more common if the patient is taking a blood thinner, either prescription or over the counter (example: Goody Powders, Fish oil, Aspirin, Garlic, etc.), or if suffering a condition associated with impaired coagulation (example: Hemophilia, cirrhosis of the liver, low platelet counts, etc.). However, even if you do not have one on these, it can still happen. If you have any of these conditions, or take one of these drugs, make sure to notify your treating physician. 3. Infection: This is more common in patients with a compromised immune system, either due to disease (example: diabetes, cancer, human immunodeficiency virus [HIV], etc.), or due to medications or treatments (example: therapies used  to treat cancer and rheumatological diseases). However, even if you do not have one on these, it can still happen. If you have any of these conditions, or take one of these drugs, make sure to notify your treating physician. 4. Nerve Damage: This is more common when the treatment is an invasive one, but it can also happen with the use of medications, such as those used in the treatment of cancer. The damage can occur to small secondary nerves, or to large primary ones, such as those in the spinal cord and brain. This damage may be temporary or permanent and it may lead to impairments that can range from temporary numbness to permanent paralysis and/or brain death. 5. Allergic Reactions: Any time a substance or material comes in contact with our body, there is the possibility of an allergic reaction. These can range from a mild skin rash (contact dermatitis) to a severe systemic reaction (anaphylactic reaction), which can result in death. 6. Death: In general, any medical intervention can result in death, most of the time due to an unforeseen complication. ____________________________________________________________________________________________   ____________________________________________________________________________________________  Medication Rules  Purpose: To inform patients, and their family members, of our rules and regulations.  Applies to: All patients receiving prescriptions (written or electronic).  Pharmacy of record: Pharmacy where electronic prescriptions will be sent. If written prescriptions are taken to a different pharmacy, please inform the nursing staff. The pharmacy listed in the electronic medical record should be the one where you would like electronic prescriptions to be sent.  Electronic prescriptions: In compliance with the [place] (STOP) Act of 2017 (Session Lanny Cramp [phone]), effective June 12, 2018, all controlled  substances must be electronically prescribed. Calling prescriptions to the pharmacy will cease to exist.  Prescription refills: Only during scheduled appointments. Applies to all prescriptions.  NOTE: The following applies primarily to controlled substances (Opioid* Pain Medications).   Type of encounter (visit): For patients receiving controlled substances, face-to-face visits are required. (Not an option or up to the patient.)  Patient's responsibilities: 1. Pain Pills: Bring all pain pills to every appointment (except for procedure appointments). 2. Pill Bottles: Bring pills in original pharmacy bottle. Always bring the newest bottle. Bring bottle, even if empty. 3. Medication refills: You are responsible for knowing and keeping track of what medications you take and those you need refilled. The day before your appointment: write a list of all prescriptions that need to be refilled. The day of the appointment: give the list to the admitting nurse. Prescriptions will be written only during appointments. No prescriptions will be written on procedure days. If you forget a medication: it will not be "Called in", "Faxed", or "electronically sent". You will need to get another appointment to get these prescribed. No early refills. Do not call asking to have your prescription filled early. 4. Prescription Accuracy: You are responsible for carefully inspecting your prescriptions before leaving our office. Have the discharge nurse carefully go over each prescription with you, before taking them home. Make sure that your name is accurately spelled, that your address is correct. Check the name and dose of your medication to make sure it is accurate. Check the number of pills, and the written instructions to make sure they are clear and accurate. Make sure that you are given enough medication to last until your next medication refill appointment. 5. Taking Medication: Take medication as prescribed. When it  comes to controlled substances, taking less pills or less frequently than prescribed  is permitted and encouraged. Never take more pills than instructed. Never take medication more frequently than prescribed.  6. Inform other Doctors: Always inform, all of your healthcare providers, of all the medications you take. 7. Pain Medication from other Providers: You are not allowed to accept any additional pain medication from any other Doctor or Healthcare provider. There are two exceptions to this rule. (see below) In the event that you require additional pain medication, you are responsible for notifying us, as stated below. 8. Cough Medicine: Often these contain an opioid, such as codeine or hydrocodone. Never accept or take cough medicine containing these opioids if you are already taking an opioid* medication. The combination may cause respiratory failure and death. 9. Medication Agreement: You are responsible for carefully reading and following our Medication Agreement. This must be signed before receiving any prescriptions from our practice. Safely store a copy of your signed Agreement. Violations to the Agreement will result in no further prescriptions. (Additional copies of our Medication Agreement are available upon request.) 10. Laws, Rules, & Regulations: All patients are expected to follow all Federal and [employer], [employer], Rules, [employer] [facility]. Ignorance of the Laws does not constitute a valid excuse.  11. Illegal drugs and Controlled Substances: The use of illegal substances (including, but not limited to marijuana and its derivatives) and/or the illegal use of any controlled substances is strictly prohibited. Violation of this rule may result in the immediate and permanent discontinuation of any and all prescriptions being written by our practice. The use of any illegal substances is prohibited. 12. Adopted CDC guidelines & recommendations: Target dosing levels will be at or below 60 MME/day.  Use of benzodiazepines** is not recommended.  Exceptions: There are only two exceptions to the rule of not receiving pain medications from other Healthcare Providers. 1. Exception #1 (Emergencies): In the event of an emergency (i.e.: accident requiring emergency care), you are allowed to receive additional pain medication. However, you are responsible for: As soon as you are able, call our office [phone], at any time of the day or night, and leave a message stating your name, the date and nature of the emergency, and the name and dose of the medication prescribed. In the event that your call is answered by a member of our staff, make sure to document and save the date, time, and the name of the person that took your information.  2. Exception #2 (Planned Surgery): In the event that you are scheduled by another doctor or dentist to have any type of surgery or procedure, you are allowed (for a period no longer than 30 days), to receive additional pain medication, for the acute post-op pain. However, in this case, you are responsible for picking up a copy of our "Post-op Pain Management for Surgeons" handout, and giving it to your surgeon or dentist. This document is available at our office, and does not require an appointment to obtain it. Simply go to our office during business hours (Monday-Thursday from 8:00 AM to 4:00 PM) (Friday 8:00 AM to 12:00 Noon) or if you have a scheduled appointment with [place], prior to your surgery, and ask for it by name. In addition, you are responsible for: calling our office [phone], at any time of the day or night, and leaving a message stating your name, name of your surgeon, type of surgery, and date of procedure or surgery. Failure to comply with your responsibilities may result in termination of therapy involving the controlled substances.  *  Opioid medications include: morphine, codeine, oxycodone, oxymorphone, hydrocodone, hydromorphone, meperidine, tramadol,  tapentadol, buprenorphine, fentanyl, methadone. **Benzodiazepine medications include: diazepam (Valium), alprazolam (Xanax), clonazepam (Klonopine), lorazepam (Ativan), clorazepate (Tranxene), chlordiazepoxide (Librium), estazolam (Prosom), oxazepam (Serax), temazepam (Restoril), triazolam (Halcion) (Last updated: 05/10/2020) ____________________________________________________________________________________________   ____________________________________________________________________________________________  Medication Recommendations and Reminders  Applies to: All patients receiving prescriptions (written and/or electronic).  Medication Rules & Regulations: These rules and regulations exist for your safety and that of others. They are not flexible and neither are we. Dismissing or ignoring them will be considered "non-compliance" with medication therapy, resulting in complete and irreversible termination of such therapy. (See document titled "Medication Rules" for more details.) In all conscience, because of safety reasons, we cannot continue providing a therapy where the patient does not follow instructions.  Pharmacy of record:   Definition: This is the pharmacy where your electronic prescriptions will be sent.   We do not endorse any particular pharmacy, however, we have experienced problems with [facility] not securing enough medication supply for the community.  We do not restrict you in your choice of pharmacy. However, once we write for your prescriptions, we will NOT be re-sending more prescriptions to fix restricted supply problems created by your pharmacy, or your insurance.   The pharmacy listed in the electronic medical record should be the one where you want electronic prescriptions to be sent.  If you choose to change pharmacy, simply notify our nursing staff.  Recommendations:  Keep all of your pain medications in a safe place, under lock and key, even if you live alone.  We will NOT replace lost, stolen, or damaged medication.  After you fill your prescription, take 1 week's worth of pills and put them away in a safe place. You should keep a separate, properly labeled bottle for this purpose. The remainder should be kept in the original bottle. Use this as your primary supply, until it runs out. Once it's gone, then you know that you have 1 week's worth of medicine, and it is time to come in for a prescription refill. If you do this correctly, it is unlikely that you will ever run out of medicine.  To make sure that the above recommendation works, it is very important that you make sure your medication refill appointments are scheduled at least 1 week before you run out of medicine. To do this in an effective manner, make sure that you do not leave the office without scheduling your next medication management appointment. Always ask the nursing staff to show you in your prescription , when your medication will be running out. Then arrange for the receptionist to get you a return appointment, at least 7 days before you run out of medicine. Do not wait until you have 1 or 2 pills left, to come in. This is very poor planning and does not take into consideration that we may need to cancel appointments due to bad weather, sickness, or emergencies affecting our staff.  DO NOT ACCEPT A "Partial Fill": If for any reason your pharmacy does not have enough pills/tablets to completely fill or refill your prescription, do not allow for a "partial fill". The law allows the pharmacy to complete that prescription within 72 hours, without requiring a new prescription. If they do not fill the rest of your prescription within those 72 hours, you will need a separate prescription to fill the remaining amount, which we will NOT provide. If the reason for the partial fill is your insurance, you will need  to talk to the pharmacist about payment alternatives for the remaining tablets, but again, DO  NOT ACCEPT A PARTIAL FILL, unless you can trust your pharmacist to obtain the remainder of the pills within 72 hours.  Prescription refills and/or changes in medication(s):   Prescription refills, and/or changes in dose or medication, will be conducted only during scheduled medication management appointments. (Applies to both, written and electronic prescriptions.)  No refills on procedure days. No medication will be changed or started on procedure days. No changes, adjustments, and/or refills will be conducted on a procedure day. Doing so will interfere with the diagnostic portion of the procedure.  No phone refills. No medications will be "called into the pharmacy".  No Fax refills.  No weekend refills.  No Holliday refills.  No after hours refills.  Remember:  Business hours are:  Monday to Thursday 8:00 AM to 4:00 PM Provider's Schedule: Milinda Pointer, MD - Appointments are:  Medication management: Monday and Wednesday 8:00 AM to 4:00 PM Procedure day: Tuesday and Thursday 7:30 AM to 4:00 PM Gillis Santa, MD - Appointments are:  Medication management: Tuesday and Thursday 8:00 AM to 4:00 PM Procedure day: Monday and Wednesday 7:30 AM to 4:00 PM (Last update: 12/31/2019) ____________________________________________________________________________________________   ____________________________________________________________________________________________  CBD (cannabidiol) WARNING  Applicable to: All individuals currently taking or considering taking CBD (cannabidiol) and, more important, all patients taking opioid analgesic controlled substances (pain medication). (Example: oxycodone; oxymorphone; hydrocodone; hydromorphone; morphine; methadone; tramadol; tapentadol; fentanyl; buprenorphine; butorphanol; dextromethorphan; meperidine; codeine; etc.)  Legal status: CBD remains a Schedule I drug prohibited for any use. CBD is illegal with one exception. In the [place], CBD has a limited Transport planner (FDA) approval for the treatment of two specific types of epilepsy disorders. Only one CBD product has been approved by the FDA for this purpose: "Epidiolex". FDA is aware that some companies are marketing products containing cannabis and cannabis-derived compounds in ways that violate the [employer], Drug and Cosmetic Act [facility] Act) and that may put the health and safety of consumers at risk. The FDA, a Federal agency, has not enforced the CBD status since 2018.   Legality: Some manufacturers ship CBD products nationally, which is illegal. Often such products are sold online and are therefore available throughout the country. CBD is openly sold in head shops and health food stores in some states where such sales have not been explicitly legalized. Selling unapproved products with unsubstantiated therapeutic claims is not only a violation of the law, but also can put patients at risk, as these products have not been proven to be safe or effective. Federal illegality makes it difficult to conduct research on CBD.  Reference: "FDA Regulation of Cannabis and Cannabis-Derived Products, Including Cannabidiol (CBD)" - [employer]  Warning: CBD is not FDA approved and has not undergo the same manufacturing controls as prescription drugs.  This means that the purity and safety of available CBD may be questionable. Most of the time, despite manufacturer's claims, it is contaminated with THC (delta-9-tetrahydrocannabinol - the chemical in marijuana responsible for the "HIGH").  When this is the case, the [facility] contaminant will trigger a positive urine drug screen (UDS) test for Marijuana (carboxy-THC). Because a positive UDS for any illicit substance is a violation of our medication agreement, your opioid analgesics (pain medicine) may be  permanently discontinued.  MORE ABOUT CBD  General Information: CBD  is a derivative of the Marijuana (cannabis sativa) plant discovered in 79. It is one of  the 113 identified substances found in Marijuana. It accounts for up to 40% of the plant's extract. As of 2018, preliminary clinical studies on CBD included research for the treatment of anxiety, movement disorders, and pain. CBD is available and consumed in multiple forms, including inhalation of smoke or vapor, as an aerosol spray, and by mouth. It may be supplied as an oil containing CBD, capsules, dried cannabis, or as a liquid solution. CBD is thought not to be as psychoactive as THC (delta-9-tetrahydrocannabinol - the chemical in marijuana responsible for the "HIGH"). Studies suggest that CBD may interact with different biological target receptors in the body, including cannabinoid and other neurotransmitter receptors. As of 2018 the mechanism of action for its biological effects has not been determined.  Side-effects  Adverse reactions: Dry mouth, diarrhea, decreased appetite, fatigue, drowsiness, malaise, weakness, sleep disturbances, and others.  Drug interactions: CBC may interact with other medications such as blood-thinners. (Last update: 01/17/2020) ____________________________________________________________________________________________   ____________________________________________________________________________________________  Drug Holidays (Slow)  What is a "Drug Holiday"? Drug Holiday: is the name given to the period of time during which a patient stops taking a medication(s) for the purpose of eliminating tolerance to the drug.  Benefits . Improved effectiveness of opioids. . Decreased opioid dose needed to achieve benefits. . Improved pain with lesser dose.  What is tolerance? Tolerance: is the progressive decreased in effectiveness of a drug due to its repetitive use. With repetitive use, the body gets use  to the medication and as a consequence, it loses its effectiveness. This is a common problem seen with opioid pain medications. As a result, a larger dose of the drug is needed to achieve the same effect that used to be obtained with a smaller dose.  How long should a "Drug Holiday" last? You should stay off of the pain medicine for at least 14 consecutive days. (2 weeks)  Should I stop the medicine "cold [place]"? No. You should always coordinate with your Pain Specialist so that he/she can provide you with the correct medication dose to make the transition as smoothly as possible.  How do I stop the medicine? Slowly. You will be instructed to decrease the daily amount of pills that you take by one (1) pill every seven (7) days. This is called a "slow downward taper" of your dose. For example: if you normally take four (4) pills per day, you will be asked to drop this dose to three (3) pills per day for seven (7) days, then to two (2) pills per day for seven (7) days, then to one (1) per day for seven (7) days, and at the end of those last seven (7) days, this is when the "Drug Holiday" would start.   Will I have withdrawals? By doing a "slow downward taper" like this one, it is unlikely that you will experience any significant withdrawal symptoms. Typically, what triggers withdrawals is the sudden stop of a high dose opioid therapy. Withdrawals can usually be avoided by slowly decreasing the dose over a prolonged period of time. If you do not follow these instructions and decide to stop your medication abruptly, withdrawals may be possible.  What are withdrawals? Withdrawals: refers to the wide range of symptoms that occur after stopping or dramatically reducing opiate drugs after heavy and prolonged use. Withdrawal symptoms do not occur to patients that use low dose opioids, or those who take the medication sporadically. Contrary to benzodiazepine (example: Valium, Xanax, etc.) or alcohol  withdrawals ("Delirium Tremens"), opioid withdrawals  are not lethal. Withdrawals are the physical manifestation of the body getting rid of the excess receptors.  Expected Symptoms Early symptoms of withdrawal may include: . Agitation . Anxiety . Muscle aches . Increased tearing . Insomnia . Runny nose . Sweating . Yawning  Late symptoms of withdrawal may include: . Abdominal cramping . Diarrhea . Dilated pupils . Goose bumps . Nausea . Vomiting  Will I experience withdrawals? Due to the slow nature of the taper, it is very unlikely that you will experience any.  What is a slow taper? Taper: refers to the gradual decrease in dose.  (Last update: 12/31/2019) ____________________________________________________________________________________________    ____________________________________________________________________________________________  Preparing for your procedure (without sedation)  Procedure appointments are limited to planned procedures: . No Prescription Refills. . No disability issues will be discussed. . No medication changes will be discussed.  Instructions: . Oral Intake: Do not eat or drink anything for at least 6 hours prior to your procedure. (Exception: Blood Pressure Medication. See below.) . Transportation: Unless otherwise stated by your physician, you may drive yourself after the procedure. . Blood Pressure Medicine: Do not forget to take your blood pressure medicine with a sip of water the morning of the procedure. If your Diastolic (lower reading)is above 100 mmHg, elective cases will be cancelled/rescheduled. . Blood thinners: These will need to be stopped for procedures. Notify our staff if you are taking any blood thinners. Depending on which one you take, there will be specific instructions on how and when to stop it. . Diabetics on insulin: Notify the staff so that you can be scheduled 1st case in the morning. If your diabetes requires high  dose insulin, take only  of your normal insulin dose the morning of the procedure and notify the staff that you have done so. . Preventing infections: Shower with an antibacterial soap the morning of your procedure.  . Build-up your immune system: Take 1000 mg of Vitamin C with every meal (3 times a day) the day prior to your procedure. Marland Kitchen Antibiotics: Inform the staff if you have a condition or reason that requires you to take antibiotics before dental procedures. . Pregnancy: If you are pregnant, call and cancel the procedure. . Sickness: If you have a cold, fever, or any active infections, call and cancel the procedure. . Arrival: You must be in the facility at least 30 minutes prior to your scheduled procedure. . Children: Do not bring any children with you. . Dress appropriately: Bring dark clothing that you would not mind if they get stained. . Valuables: Do not bring any jewelry or valuables.  Reasons to call and reschedule or cancel your procedure: (Following these recommendations will minimize the risk of a serious complication.) . Surgeries: Avoid having procedures within 2 weeks of any surgery. (Avoid for 2 weeks before or after any surgery). . Flu Shots: Avoid having procedures within 2 weeks of a flu shots or . (Avoid for 2 weeks before or after immunizations). . Barium: Avoid having a procedure within 7-10 days after having had a radiological study involving the use of radiological contrast. (Myelograms, Barium swallow or enema study). . Heart attacks: Avoid any elective procedures or surgeries for the initial 6 months after a "Myocardial Infarction" (Heart Attack). . Blood thinners: It is imperative that you stop these medications before procedures. Let us know if you if you take any blood thinner.  . Infection: Avoid procedures during or within two weeks of an infection (including chest colds or gastrointestinal problems).  Symptoms associated with infections include: Localized  redness, fever, chills, night sweats or profuse sweating, burning sensation when voiding, cough, congestion, stuffiness, runny nose, sore throat, diarrhea, nausea, vomiting, cold or Flu symptoms, recent or current infections. It is specially important if the infection is over the area that we intend to treat. Marland Kitchen Heart and lung problems: Symptoms that may suggest an active cardiopulmonary problem include: cough, chest pain, breathing difficulties or shortness of breath, dizziness, ankle swelling, uncontrolled high or unusually low blood pressure, and/or palpitations. If you are experiencing any of these symptoms, cancel your procedure and contact your primary care physician for an evaluation.  Remember:  Regular Business hours are:  Monday to Thursday 8:00 AM to 4:00 PM  Provider's Schedule: Delano Metz, MD:  Procedure days: Tuesday and Thursday 7:30 AM to 4:00 PM  Edward Jolly, MD:  Procedure days: Monday and Wednesday 7:30 AM to 4:00 PM ____________________________________________________________________________________________

## 2020-11-16 ENCOUNTER — Encounter: Payer: Self-pay | Admitting: Pain Medicine

## 2020-11-16 ENCOUNTER — Other Ambulatory Visit: Payer: Self-pay

## 2020-11-16 ENCOUNTER — Ambulatory Visit: Payer: Self-pay | Attending: Pain Medicine | Admitting: Pain Medicine

## 2020-11-16 VITALS — BP 110/65 | HR 46 | Temp 97.2°F | Resp 14 | Ht 63.0 in | Wt 131.0 lb

## 2020-11-16 DIAGNOSIS — M170 Bilateral primary osteoarthritis of knee: Secondary | ICD-10-CM | POA: Insufficient documentation

## 2020-11-16 DIAGNOSIS — G8929 Other chronic pain: Secondary | ICD-10-CM | POA: Insufficient documentation

## 2020-11-16 DIAGNOSIS — M1712 Unilateral primary osteoarthritis, left knee: Secondary | ICD-10-CM | POA: Insufficient documentation

## 2020-11-16 DIAGNOSIS — M25562 Pain in left knee: Secondary | ICD-10-CM | POA: Insufficient documentation

## 2020-11-16 DIAGNOSIS — M1711 Unilateral primary osteoarthritis, right knee: Secondary | ICD-10-CM | POA: Insufficient documentation

## 2020-11-16 DIAGNOSIS — M25561 Pain in right knee: Secondary | ICD-10-CM | POA: Insufficient documentation

## 2020-11-16 MED ORDER — LIDOCAINE HCL (PF) 2 % IJ SOLN
INTRAMUSCULAR | Status: AC
  Filled 2020-11-16: qty 5

## 2020-11-16 MED ORDER — ROPIVACAINE HCL 2 MG/ML IJ SOLN
4.0000 mL | Freq: Once | INTRAMUSCULAR | Status: AC
  Administered 2020-11-16: 20 mL via INTRA_ARTICULAR

## 2020-11-16 MED ORDER — TRIAMCINOLONE ACETONIDE 32 MG IX SRER
32.0000 mg | Freq: Once | INTRA_ARTICULAR | Status: AC
Start: 2020-11-16 — End: 2020-11-16
  Filled 2020-11-16: qty 1

## 2020-11-16 MED ORDER — LIDOCAINE HCL 2 % IJ SOLN
20.0000 mL | Freq: Once | INTRAMUSCULAR | Status: AC
  Administered 2020-11-16: 100 mg

## 2020-11-16 MED ORDER — TRIAMCINOLONE ACETONIDE 32 MG IX SRER
32.0000 mg | Freq: Once | INTRA_ARTICULAR | Status: AC
  Filled 2020-11-16: qty 1

## 2020-11-16 MED ORDER — ROPIVACAINE HCL 2 MG/ML IJ SOLN
INTRAMUSCULAR | Status: AC
  Filled 2020-11-16: qty 20

## 2020-11-16 NOTE — Progress Notes (Signed)
PROVIDER NOTE: Information contained herein reflects review and annotations entered in association with encounter. Interpretation of such information and data should be left to medically-trained personnel. Information provided to patient can be located elsewhere in the medical record under "Patient Instructions". Document created using STT-dictation technology, any transcriptional errors that may result from process are unintentional.    Patient: Natasha Hanson  Service Category: Procedure  Provider: Oswaldo Done, MD  DOB: [birth year]  DOS: 11/16/2020  Location: [facility]  MRN: [id number]  Setting: Ambulatory - outpatient  Referring Provider: No ref. provider found  Type: Established Patient  Specialty: Interventional Pain Management  PCP: No primary care provider on file.   Primary Reason for Visit: Interventional Pain Management Treatment. CC: Knee Pain (bilateral)  Procedure:          Anesthesia, Analgesia, Anxiolysis:  Type: Diagnostic Intra-Articular Local anesthetic and steroid (Zilretta) knee Injection #1  Region: Lateral infrapatellar Knee Region Level: Knee Joint Laterality: Bilateral  Type: Local Anesthesia Indication(s): Analgesia         Local Anesthetic: Lidocaine 1-2% Route: Infiltration ([place]/IM) IV Access: Declined Sedation: Declined   Position: Sitting   Indications: 1. Chronic knee pain (2ry area of Pain) (Bilateral) (L>R)   2. Osteoarthritis of the knees (Bilateral)   3. Osteoarthritis of knee (Left)   4. Osteoarthritis of knee (Right)    Pain Score: Pre-procedure: 9 /10 Post-procedure: 0-No pain (left and right)/10   Pre-op H&P Assessment:  Natasha Hanson is a 55 y.o. (year old), female patient, seen today for interventional treatment. She  has a past surgical history that includes Refractive surgery; Abdominal hysterectomy; Foot surgery; and Foot Fusion. Natasha Hanson has a current medication list which includes the following prescription(s):  albuterol, baclofen, escitalopram, gabapentin, oxycodone, [START ON 12/15/2020] oxycodone, [START ON 01/14/2021] oxycodone, proair hfa, propranolol, topiramate, gabapentin, magnesium oxide, methocarbamol, sucralfate, and turmeric. Her primarily concern today is the Knee Pain (bilateral)  Initial Vital Signs:  Pulse/HCG Rate: (!) 45  Temp: (!) 97.2 F (36.2 C) Resp: 14 BP: 110/65 SpO2: 100 %  BMI: Estimated body mass index is 23.21 kg/m as calculated from the following:   Height as of this encounter: 5\' 3"  (1.6 m).   Weight as of this encounter: 131 lb (59.4 kg).  Risk Assessment: Allergies: Reviewed. She has No Known Allergies.  Allergy Precautions: None required Coagulopathies: Reviewed. None identified.  Blood-thinner therapy: None at this time Active Infection(s): Reviewed. None identified. Natasha Hanson is afebrile  Site Confirmation: Natasha Hanson was asked to confirm the procedure and laterality before marking the site Procedure checklist: Completed Consent: Before the procedure and under the influence of no sedative(s), amnesic(s), or anxiolytics, the patient was informed of the treatment options, risks and possible complications. To fulfill our ethical and legal obligations, as recommended by the American Medical Association's Code of Ethics, I have informed the patient of my clinical impression; the nature and purpose of the treatment or procedure; the risks, benefits, and possible complications of the intervention; the alternatives, including doing nothing; the risk(s) and benefit(s) of the alternative treatment(s) or procedure(s); and the risk(s) and benefit(s) of doing nothing. The patient was provided information about the general risks and possible complications associated with the procedure. These may include, but are not limited to: failure to achieve desired goals, infection, bleeding, organ or nerve damage, allergic reactions, paralysis, and death. In addition, the patient was  informed of those risks and complications associated to the procedure, such as failure to decrease  pain; infection; bleeding; organ or nerve damage with subsequent damage to sensory, motor, and/or autonomic systems, resulting in permanent pain, numbness, and/or weakness of one or several areas of the body; allergic reactions; (i.e.: anaphylactic reaction); and/or death. Furthermore, the patient was informed of those risks and complications associated with the medications. These include, but are not limited to: allergic reactions (i.e.: anaphylactic or anaphylactoid reaction(s)); adrenal axis suppression; blood sugar elevation that in diabetics may result in ketoacidosis or comma; water retention that in patients with history of congestive heart failure may result in shortness of breath, pulmonary edema, and decompensation with resultant heart failure; weight gain; swelling or edema; medication-induced neural toxicity; particulate matter embolism and blood vessel occlusion with resultant organ, and/or nervous system infarction; and/or aseptic necrosis of one or more joints. Finally, the patient was informed that Medicine is not an exact science; therefore, there is also the possibility of unforeseen or unpredictable risks and/or possible complications that may result in a catastrophic outcome. The patient indicated having understood very clearly. We have given the patient no guarantees and we have made no promises. Enough time was given to the patient to ask questions, all of which were answered to the patient's satisfaction. Natasha Hanson has indicated that she wanted to continue with the procedure. Attestation: I, the ordering provider, attest that I have discussed with the patient the benefits, risks, side-effects, alternatives, likelihood of achieving goals, and potential problems during recovery for the procedure that I have provided informed consent. Date  Time: 11/16/2020 11:42 AM  Pre-Procedure Preparation:   Monitoring: As per clinic protocol. Respiration, ETCO2, SpO2, BP, heart rate and rhythm monitor placed and checked for adequate function Safety Precautions: Patient was assessed for positional comfort and pressure points before starting the procedure. Time-out: I initiated and conducted the "Time-out" before starting the procedure, as per protocol. The patient was asked to participate by confirming the accuracy of the "Time Out" information. Verification of the correct person, site, and procedure were performed and confirmed by me, the nursing staff, and the patient. "Time-out" conducted as per Joint Commission's Universal Protocol (UP.01.01.01). Time: 1202  Description of Procedure:          Target Area: Knee Joint Approach: Just above the Lateral tibial plateau, lateral to the infrapatellar tendon. Area Prepped: Entire knee area, from the mid-thigh to the mid-shin. DuraPrep (Iodine Povacrylex [0.7% available iodine] and Isopropyl Alcohol, 74% w/w) Safety Precautions: Aspiration looking for blood return was conducted prior to all injections. At no point did we inject any substances, as a needle was being advanced. No attempts were made at seeking any paresthesias. Safe injection practices and needle disposal techniques used. Medications properly checked for expiration dates. SDV (single dose vial) medications used. Description of the Procedure: Protocol guidelines were followed. The patient was placed in position over the fluoroscopy table. The target area was identified and the area prepped in the usual manner. Skin & deeper tissues infiltrated with local anesthetic. Appropriate amount of time allowed to pass for local anesthetics to take effect. The procedure needles were then advanced to the target area. Proper needle placement secured. Negative aspiration confirmed. Solution injected in intermittent fashion, asking for systemic symptoms every 0.5cc of injectate. The needles were then removed and  the area cleansed, making sure to leave some of the prepping solution back to take advantage of its long term bactericidal properties. Vitals:   11/16/20 1141 11/16/20 1142  BP: 110/65   Pulse: (!) 45 (!) 46  Resp: 14 14  Temp: (!) 97.2 F (36.2 C)   TempSrc: Temporal   SpO2: 100% 100%  Weight: 131 lb (59.4 kg)   Height: 5\' 3"  (1.6 m)     Start Time: 1202 hrs. End Time: 1209 hrs. Materials:  Needle(s) Type: Regular needle Gauge: 25G Length: 1.5-in Medication(s): Please see orders for medications and dosing details.  Imaging Guidance:          Type of Imaging Technique: None used Indication(s): N/A Exposure Time: No patient exposure Contrast: None used. Fluoroscopic Guidance: N/A Ultrasound Guidance: N/A Interpretation: N/A  Antibiotic Prophylaxis:   Anti-infectives (From admission, onward)   None     Indication(s): None identified  Post-operative Assessment:  Post-procedure Vital Signs:  Pulse/HCG Rate: (!) 46  Temp: (!) 97.2 F (36.2 C) Resp: 14 BP: 110/65 SpO2: 100 %  EBL: None  Complications: No immediate post-treatment complications observed by team, or reported by patient.  Note: The patient tolerated the entire procedure well. A repeat set of vitals were taken after the procedure and the patient was kept under observation following institutional policy, for this type of procedure. Post-procedural neurological assessment was performed, showing return to baseline, prior to discharge. The patient was provided with post-procedure discharge instructions, including a section on how to identify potential problems. Should any problems arise concerning this procedure, the patient was given instructions to immediately contact , at any time, without hesitation. In any case, we plan to contact the patient by telephone for a follow-up status report regarding this interventional procedure.  Comments:  No additional relevant information.  Plan of Care  Orders:   Orders Placed This Encounter  Procedures  . KNEE INJECTION    Local Anesthetic & Steroid injection.    Scheduling Instructions:     Side(s): Bilateral Knee     Sedation: None     Timeframe: Today    Order Specific Question:   Where will this procedure be performed?    Answer:   [facility]  . Informed Consent Details: Physician/Practitioner Attestation; Transcribe to consent form and obtain patient signature    Note: Always confirm laterality of pain with Ms. Haisley, before procedure. Transcribe to consent form and obtain patient signature.    Order Specific Question:   Physician/Practitioner attestation of informed consent for procedure/surgical case    Answer:   I, the physician/practitioner, attest that I have discussed with the patient the benefits, risks, side effects, alternatives, likelihood of achieving goals and potential problems during recovery for the procedure that I have provided informed consent.    Order Specific Question:   Procedure    Answer:   Bilateral intra-articular knee arthrocentesis (aspiration and/or injection)    Order Specific Question:   Physician/Practitioner performing the procedure    Answer:   Margaruite Top A. [place], MD    Order Specific Question:   Indication/Reason    Answer:   Chronic bilateral knee pain secondary to knee arthropathy/arthralgia  . Provide equipment / supplies at bedside    "Block Tray" (Disposable  single use) Needle type: SpinalRegular Amount/quantity: 2 Size: Short(1.5-inch) Gauge: 25G    Standing Status:   Standing    Number of Occurrences:   1    Order Specific Question:   Specify    Answer:   Block Tray   Chronic Opioid Analgesic:  Oxycodone IR 5 mg every 8 hours (15 mg/day of oxycodone) Highest recorded MME/day: 30 mg/day MME/day: 22.5 mg/day   Medications ordered for procedure: Meds ordered this encounter  Medications  .  lidocaine (XYLOCAINE) 2 % (with pres) injection 400 mg  . ropivacaine (PF) 2 mg/mL  (0.2%) (NAROPIN) injection 4 mL  . Triamcinolone Acetonide SRER 32 mg    Maintain refrigerated.  Prepared suspension may be stored up to 4 hours at ambient conditions.  . Triamcinolone Acetonide SRER 32 mg    Maintain refrigerated.  Prepared suspension may be stored up to 4 hours at ambient conditions.   Medications administered: We administered lidocaine, ropivacaine (PF) 2 mg/mL (0.2%), Triamcinolone Acetonide, and Triamcinolone Acetonide.  See the medical record for exact dosing, route, and time of administration.  Follow-up plan:   Return in about 2 weeks (around 11/30/2020) for procedure day (afternoon VV) (PPE).       Interventional Therapies  Risk  Complexity Considerations:   Estimated body mass index is 23.21 kg/m as calculated from the following:   Height as of this encounter: 5\' 3"  (1.6 m).   Weight as of this encounter: 131 lb (59.4 kg). WNL   Planned  Pending:      Under consideration:   Diagnostic left lumbar ESI  Possible bilateral lumbar facet RFA  Diagnostic bilateral intra-articular knee joint injection  Possible bilateral Hyalgan knee injections  Diagnostic bilateral genicular nerve block  Possible bilateral genicular nerve RFA  Diagnostic left cervical ESI  Diagnostic bilateral cervical facet block  Possible bilateral cervical facet RFA    Completed:   Diagnostic bilateral lumbar facet block x2 (08/10/2020)  Diagnostic bilateral IA steroid (Zilretta) knee injection x1 (11/16/2020)    Therapeutic  Palliative (PRN) options:   None established    Recent Visits Date Type Provider Dept  11/15/20 Office Visit Delano MetzNaveira, Nayelie Gionfriddo, MD [facility]  Showing recent visits within past 90 days and meeting all other requirements Today's Visits Date Type Provider Dept  11/16/20 Procedure visit Delano MetzNaveira, Shyanne Mcclary, MD [facility]  Showing today's visits and meeting all other requirements Future Appointments Date Type Provider Dept  11/30/20  Appointment Delano MetzNaveira, Joanie Duprey, MD [facility]  02/09/21 Appointment Delano MetzNaveira, Suprina Mandeville, MD [facility]  Showing future appointments within next 90 days and meeting all other requirements  Disposition: Discharge home  Discharge (Date  Time): 11/16/2020; 1215 hrs.   Primary Care Physician: No primary care provider on file. Location: [facility] Note by: Oswaldo DoneFrancisco A Fredy Gladu, MD Date: 11/16/2020; Time: 12:27 PM  Disclaimer:  Medicine is not an Visual merchandiserexact science. The only guarantee in medicine is that nothing is guaranteed. It is important to note that the decision to proceed with this intervention was based on the information collected from the patient. The Data and conclusions were drawn from the patient's questionnaire, the interview, and the physical examination. Because the information was provided in large part by the patient, it cannot be guaranteed that it has not been purposely or unconsciously manipulated. Every effort has been made to obtain as much relevant data as possible for this evaluation. It is important to note that the conclusions that lead to this procedure are derived in large part from the available data. Always take into account that the treatment will also be dependent on availability of resources and existing treatment guidelines, considered by other Pain Management Practitioners as being common knowledge and practice, at the time of the intervention. For Medico-Legal purposes, it is also important to point out that variation in procedural techniques and pharmacological choices are the acceptable norm. The indications, contraindications, technique, and results of the above procedure should only be interpreted and judged by  a Board-Certified Interventional Pain Specialist with extensive familiarity and expertise in the same exact procedure and technique.

## 2020-11-16 NOTE — Patient Instructions (Addendum)
____________________________________________________________________________________________  Post-Procedure Discharge Instructions  Instructions:  Apply ice:   Purpose: This will minimize any swelling and discomfort after procedure.   When: Day of procedure, as soon as you get home.  How: Fill a plastic sandwich bag with crushed ice. Cover it with a small towel and apply to injection site.  How long: (15 min on, 15 min off) Apply for 15 minutes then remove x 15 minutes.  Repeat sequence on day of procedure, until you go to bed.  Apply heat:   Purpose: To treat any soreness and discomfort from the procedure.  When: Starting the next day after the procedure.  How: Apply heat to procedure site starting the day following the procedure.  How long: May continue to repeat daily, until discomfort goes away.  Food intake: Start with clear liquids (like water) and advance to regular food, as tolerated.   Physical activities: Keep activities to a minimum for the first 8 hours after the procedure. After that, then as tolerated.  Driving: If you have received any sedation, be responsible and do not drive. You are not allowed to drive for 24 hours after having sedation.  Blood thinner: (Applies only to those taking blood thinners) You may restart your blood thinner 6 hours after your procedure.  Insulin: (Applies only to Diabetic patients taking insulin) As soon as you can eat, you may resume your normal dosing schedule.  Infection prevention: Keep procedure site clean and dry. Shower daily and clean area with soap and water.  Post-procedure Pain Diary: Extremely important that this be done correctly and accurately. Recorded information will be used to determine the next step in treatment. For the purpose of accuracy, follow these rules:  Evaluate only the area treated. Do not report or include pain from an untreated area. For the purpose of this evaluation, ignore all other areas of pain,  except for the treated area.  After your procedure, avoid taking a long nap and attempting to complete the pain diary after you wake up. Instead, set your alarm clock to go off every hour, on the hour, for the initial 8 hours after the procedure. Document the duration of the numbing medicine, and the relief you are getting from it.  Do not go to sleep and attempt to complete it later. It will not be accurate. If you received sedation, it is likely that you were given a medication that may cause amnesia. Because of this, completing the diary at a later time may cause the information to be inaccurate. This information is needed to plan your care.  Follow-up appointment: Keep your post-procedure follow-up evaluation appointment after the procedure (usually 2 weeks for most procedures, 6 weeks for radiofrequencies). DO NOT FORGET to bring you pain diary with you.   Expect: (What should I expect to see with my procedure?)  From numbing medicine (AKA: Local Anesthetics): Numbness or decrease in pain. You may also experience some weakness, which if present, could last for the duration of the local anesthetic.  Onset: Full effect within 15 minutes of injected.  Duration: It will depend on the type of local anesthetic used. On the average, 1 to 8 hours.   From steroids (Applies only if steroids were used): Decrease in swelling or inflammation. Once inflammation is improved, relief of the pain will follow.  Onset of benefits: Depends on the amount of swelling present. The more swelling, the longer it will take for the benefits to be seen. In some cases, up to 10 days.    Duration: Steroids will stay in the system x 2 weeks. Duration of benefits will depend on multiple posibilities including persistent irritating factors.  Side-effects: If present, they may typically last 2 weeks (the duration of the steroids).  Frequent: Cramps (if they occur, drink Gatorade and take over-the-counter Magnesium 450-500 mg  once to twice a day); water retention with temporary weight gain; increases in blood sugar; decreased immune system response; increased appetite.  Occasional: Facial flushing (red, warm cheeks); mood swings; menstrual changes.  Uncommon: Long-term decrease or suppression of natural hormones; bone thinning. (These are more common with higher doses or more frequent use. This is why we prefer that our patients avoid having any injection therapies in other practices.)   Very Rare: Severe mood changes; psychosis; aseptic necrosis.  From procedure: Some discomfort is to be expected once the numbing medicine wears off. This should be minimal if ice and heat are applied as instructed.  Call if: (When should I call?)  You experience numbness and weakness that gets worse with time, as opposed to wearing off.  New onset bowel or bladder incontinence. (Applies only to procedures done in the spine)  Emergency Numbers:  Durning business hours (Monday - Thursday, 8:00 AM - 4:00 PM) (Friday, 9:00 AM - 12:00 Noon): [phone]  After hours: [phone]  NOTE: If you are having a problem and are unable connect with, or to talk to a provider, then go to your nearest urgent care or emergency department. If the problem is serious and urgent, please call 911. ____________________________________________________________________________________________   Triamcinolone extended-release injection What is this medicine? TRIAMCINOLONE (trye am SIN [place]) is a corticosteroid. It helps to reduce swelling and treat pain. This medicine is used to treat arthritis in the knee. This medicine may be used for other purposes; ask your health care provider or pharmacist if you have questions. COMMON BRAND NAME(S): Zilretta What should I tell my health care provider before I take this medicine? They need to know if you have any of these conditions:  Cushing's syndrome  diabetes  glaucoma  heart  disease  high blood pressure  infection, like tuberculosis, herpes, measles, chickenpox, or fungal infection  liver disease  low levels of potassium in the blood  mental illness  myasthenia gravis  recent heart attack  seizures  stomach or intestine disease  thyroid disease  an unusual or allergic reaction to triamcinolone, corticosteroids, benzyl alcohol, other medicines, foods, dyes, or preservatives  pregnant or trying to get pregnant  breast-feeding How should I use this medicine? This medicine is injected into the joint by a health care professional. After your dose follow your doctor's instructions for your care. Contact your pediatrician regarding the use of this medicine in children. Special care may be needed. Overdosage: If you think you have taken too much of this medicine contact a poison control center or emergency room at once. NOTE: This medicine is only for you. Do not share this medicine with others. What if I miss a dose? This does not apply. What may interact with this medicine?  antiviral medicines for HIV or AIDS  aspirin  certain medicines for fungal infections like ketoconazole and itraconazole  clarithromycin  mifepristone  nefazodone  other steroid medicines  vaccines and other immunization products This list may not describe all possible interactions. Give your health care provider a list of all the medicines, herbs, non-prescription drugs, or dietary supplements you use. Also tell them if you smoke, drink alcohol, or use illegal drugs.  Some items may interact with your medicine. What should I watch for while using this medicine? Visit your doctor or health care professional for regular checks on your progress. If you are taking this medicine for a long time, carry an identification card with your name, the type and dose of medicine, and your doctor's name and address. Do not come in contact with people who have chickenpox or the measles  while you are taking this medicine. If you do, call your doctor right away. This medicine may increase blood sugar. Ask your healthcare provider if changes in diet or medicines are needed if you have diabetes. What side effects may I notice from receiving this medicine? Side effects that you should report to your doctor or health care professional as soon as possible:  allergic reactions like skin rash, itching or hives, swelling of the face, lips, or tongue  bloody or black, tarry stools  changes in emotions or moods  excessive hair growth on the face or body  eye pain  increased blood pressure  increased joint pain and swelling at site where injected  lumpy, thin skin at site where injected  rounding of face  seizures  signs and symptoms of high blood sugar such as being more thirsty or hungry or having to urinate more than normal. You may also feel very tired or have blurry vision.  signs and symptoms of infection like fever or chills; cough; sore throat; pain or trouble passing urine  sores that do not heal  stomach pain  swelling of ankles, feet, hands  trouble sleeping  unusual bruising or bleeding Side effects that usually do not require medical attention (report these to your doctor or health care professional if they continue or are bothersome):  headache  nausea  pain, redness, or irritation at site where injected  upset stomach  weight gain This list may not describe all possible side effects. Call your doctor for medical advice about side effects. You may report side effects to FDA at 1-800-FDA-1088. Where should I keep my medicine? This drug is given in a hospital or clinic and will not be stored at home. NOTE: This sheet is a summary. It may not cover all possible information. If you have questions about this medicine, talk to your doctor, pharmacist, or health care provider.  2021 [employer] Standard (2018-02-28  11:00:49)  ____________________________________________________________________________________________  Virtual Visits   What is a "Virtual Visit"? It is a Mining engineer (medical visit) that takes place on real time (NOT TEXT or E-MAIL) over the telephone or computer device (desktop, laptop, tablet, smart phone, etc.). It allows for more location flexibility between the patient and the healthcare provider.  Who decides when these types of visits will be used? The physician.  Who is eligible for these types of visits? Only those patients that can be reliably reached over the telephone.  What do you mean by reliably? We do not have time to call everyone multiple times, therefore those that tend to screen calls and then call back later are not suitable candidates for this system. We understand how people are reluctant to pickup on "unknown" calls, therefore, we suggest adding our telephone numbers to your list of "CONTACT(s)". This way, you should be able to readily identify our calls when you receive one. All of our numbers are available below.   Who is not eligible? This option is not available for medication management encounters, specially for controlled substances. Patients on pain medications that fall under the  category of controlled substances have to come in for "Face-to-Face" encounters. This is required for mandatory monitoring of these substances. You may be asked to provide a sample for an unannounced urine drug screening test (UDS), and we will need to count your pain pills. Not bringing your pills to be counted may result in no refill. Obviously, neither one of these can be done over the phone.  When will this type of visits be used? You can request a virtual visit whenever you are physically unable to attend a regular appointment. The decision will be made by the physician (or healthcare provider) on a case by case basis.   At what time will I be  called? This is an excellent question. The providers will try to call you whenever they have time available. Do not expect to be called at any specific time. The secretaries will assign you a time for your virtual visit appointment, but this is done simply to keep a list of those patients that need to be called, but not for the purpose of keeping a time schedule. Be advised that the call may come in anytime during the day, between the hours of 8:00 AM and 8::00 PM, depending on provider availability. We do understand that the system is not perfect. If you are unable to be available that day on a moments notice, then request an "in-person" appointment rather than a "virtual visit".  Can I request my medication visits to be "Virtual"? Yes you may request it, but the decision is entirely up to the healthcare provider. Control substances require specific monitoring that requires Face-to-Face encounters. The number of encounters  and the extent of the monitoring is determined on a case by case basis.  Add a new contact to your smart phone and label it "PAIN CLINIC" Under this contact add the following numbers: Main: [phone] (Official Contact Number) Nurses: [phone] (These are outgoing only calling systems. Do not call this number.) Dr. Laban Emperor: [phone] or [phone] (Outgoing calls only. Do not call this number.)  ____________________________________________________________________________________________

## 2020-11-16 NOTE — Progress Notes (Signed)
Safety precautions to be maintained throughout the outpatient stay will include: orient to surroundings, keep bed in low position, maintain call bell within reach at all times, provide assistance with transfer out of bed and ambulation.  

## 2020-11-17 ENCOUNTER — Telehealth: Payer: Self-pay

## 2020-11-17 MED FILL — Triamcinolone Acetonide Intra-articular Inj ER Susp 32 MG: INTRA_ARTICULAR | Qty: 1 | Status: AC

## 2020-11-17 NOTE — Telephone Encounter (Signed)
Post procedure phone call.  LM 

## 2020-11-18 ENCOUNTER — Other Ambulatory Visit: Payer: Self-pay

## 2020-11-19 ENCOUNTER — Other Ambulatory Visit: Payer: Self-pay

## 2020-11-20 LAB — TOXASSURE SELECT 13 (MW), URINE

## 2020-11-23 ENCOUNTER — Other Ambulatory Visit: Payer: Self-pay

## 2020-11-24 ENCOUNTER — Other Ambulatory Visit: Payer: Self-pay

## 2020-11-25 ENCOUNTER — Other Ambulatory Visit: Payer: Self-pay

## 2020-11-26 ENCOUNTER — Other Ambulatory Visit: Payer: Self-pay

## 2020-11-26 MED ORDER — TOPIRAMATE 50 MG PO TABS
ORAL_TABLET | ORAL | 2 refills | Status: DC
  Filled 2020-11-26: qty 90, 30d supply, fill #0
  Filled 2020-12-30: qty 90, 30d supply, fill #1
  Filled 2021-01-27: qty 90, 30d supply, fill #2

## 2020-11-29 ENCOUNTER — Encounter: Payer: Self-pay | Admitting: Pain Medicine

## 2020-11-30 ENCOUNTER — Other Ambulatory Visit: Payer: Self-pay

## 2020-11-30 ENCOUNTER — Ambulatory Visit: Payer: Self-pay | Attending: Pain Medicine | Admitting: Pain Medicine

## 2020-11-30 DIAGNOSIS — M542 Cervicalgia: Secondary | ICD-10-CM

## 2020-11-30 DIAGNOSIS — G894 Chronic pain syndrome: Secondary | ICD-10-CM

## 2020-11-30 DIAGNOSIS — M545 Low back pain, unspecified: Secondary | ICD-10-CM

## 2020-11-30 DIAGNOSIS — G8929 Other chronic pain: Secondary | ICD-10-CM

## 2020-11-30 DIAGNOSIS — Z79891 Long term (current) use of opiate analgesic: Secondary | ICD-10-CM

## 2020-11-30 DIAGNOSIS — M25561 Pain in right knee: Secondary | ICD-10-CM

## 2020-11-30 DIAGNOSIS — M25562 Pain in left knee: Secondary | ICD-10-CM

## 2020-11-30 DIAGNOSIS — M1712 Unilateral primary osteoarthritis, left knee: Secondary | ICD-10-CM

## 2020-11-30 DIAGNOSIS — M170 Bilateral primary osteoarthritis of knee: Secondary | ICD-10-CM

## 2020-11-30 DIAGNOSIS — Z79899 Other long term (current) drug therapy: Secondary | ICD-10-CM

## 2020-11-30 NOTE — Progress Notes (Signed)
Patient: Natasha Hanson  Service Category: E/M  Provider: Gaspar Cola, MD  DOB: 03-01-1966  DOS: 11/30/2020  Location: Office  MRN: [id number]  Setting: Ambulatory outpatient  Referring Provider: No ref. provider found  Type: Established Patient  Specialty: Interventional Pain Management  PCP: No primary care provider on file.  Location: Remote location  Delivery: TeleHealth     Virtual Encounter - Pain Management PROVIDER NOTE: Information contained herein reflects review and annotations entered in association with encounter. Interpretation of such information and data should be left to medically-trained personnel. Information provided to patient can be located elsewhere in the medical record under "Patient Instructions". Document created using STT-dictation technology, any transcriptional errors that may result from process are unintentional.    Contact & Pharmacy Preferred: [phone] Home: [phone] (home) Mobile: [phone] (mobile) E-mail: [email or web address] [id number], [address] [place] [address] Phone: [phone] Fax: [phone]   Pre-screening  [name] offered "in-person" vs "virtual" encounter. She indicated preferring virtual for this encounter.   Reason COVID-19*  Social distancing based on CDC and AMA recommendations.   I contacted DANIELLY ACKERLEY on 11/30/2020 via telephone.      I clearly identified myself as Gaspar Cola, MD. I verified that I was speaking with the correct person using two identifiers (Name: Natasha Hanson, and date of birth: Jun 02, 1966).  Consent I sought verbal advanced consent from [name] for virtual visit interactions. I informed [name] of possible security and privacy concerns, risks, and limitations associated with providing "not-in-person" medical evaluation and management services. I also informed [name] of the availability of "in-person"  appointments. Finally, I informed her that there would be a charge for the virtual visit and that she could be  personally, fully or partially, financially responsible for it. [name] expressed understanding and agreed to proceed.   Historic Elements   [name] is a 55 y.o. year old, female patient evaluated today after our last contact on 11/16/2020. [name]  has a past medical history of Allergy, Arthritis, COPD (chronic obstructive pulmonary disease) ([place]), and Glaucoma. She also  has a past surgical history that includes Refractive surgery; Abdominal hysterectomy; Foot surgery; and Foot Fusion. [name] has a current medication list which includes the following prescription(s): albuterol, baclofen, escitalopram, gabapentin, gabapentin, magnesium oxide, oxycodone, [START ON 12/15/2020] oxycodone, [START ON 01/14/2021] oxycodone, proair hfa, propranolol, sucralfate, topiramate, turmeric, and methocarbamol. She  reports that she has been smoking. She has never used smokeless tobacco. She reports that she does not drink alcohol. No history on file for drug use. [name] has No Known Allergies.   HPI  Today, she is being contacted for a post-procedure assessment.  The patient indicates still having pain on both knees, but the left side seems to be worse.  At this time, we will order an MRI of the knees to evaluate for further evidence of arthropathy since she did not respond to intra-articular knee joint injections.  X-rays done of both knees were noncontributory.  RTCB: 02/13/2021 Nonopioids transferred 05/10/2020: Magnesium, Robaxin, Neurontin, turmeric, and Mobic.  Post-Procedure Evaluation  Procedure (11/16/2020): Diagnostic/therapeutic bilateral IA steroid (Zilretta) knee injection #1, no fluoroscopy or IV sedation Pre-procedure pain level: 9/10 Post-procedure: 0/10 (100% relief)  Sedation: None.  Effectiveness during initial hour after procedure(Ultra-Short Term Relief): 100 %.  Local  anesthetic used: Long-acting (4-6 hours) Effectiveness: Defined as any analgesic benefit obtained  secondary to the administration of local anesthetics. This carries significant diagnostic value as to the etiological location, or anatomical origin, of the pain. Duration of benefit is expected to coincide with the duration of the local anesthetic used.  Effectiveness during initial 4-6 hours after procedure(Short-Term Relief): 100 %.  Long-term benefit: Defined as any relief past the pharmacologic duration of the local anesthetics.  Effectiveness past the initial 6 hours after procedure(Long-Term Relief): 75 % (left knee continues to have pain.  right knee is improved, hurts off and on but is not nearly as bad as the left knee.).  Current benefits: Defined as benefit that persist at this time.   Analgesia:   The patient indicates currently enjoying an ongoing 75% relief of the pain on the right knee, but states that the left knee continues to hurt. Function: [name] reports improvement in function ROM: [name] reports improvement in ROM  Pharmacotherapy Assessment  Analgesic: Oxycodone IR 5 mg every 8 hours (15 mg/day of oxycodone) Highest recorded MME/day: 30 mg/day MME/day: 22.5 mg/day   Monitoring: [place] PMP: PDMP reviewed during this encounter.       Pharmacotherapy: No side-effects or adverse reactions reported. Compliance: No problems identified. Effectiveness: Clinically acceptable. Plan: Refer to "POC".  UDS:  Summary  Date Value Ref Range Status  11/15/2020 Note  Final    Comment:    ==================================================================== [employer] [facility] (MW) ==================================================================== Test                             Result       Flag       Units  Drug Present and Declared for Prescription Verification   Oxymorphone                    199          EXPECTED   ng/mg creat   Noroxycodone                   133           EXPECTED   ng/mg creat    Oxymorphone and noroxycodone are expected metabolites of oxycodone.    Sources of oxycodone are scheduled prescription medications.    Oxymorphone is also available as a scheduled prescription medication.  Drug Absent but Declared for Prescription Verification   Oxycodone                      Not Detected UNEXPECTED ng/mg creat    Oxycodone is almost always present in patients taking this drug    consistently.  Absence of oxycodone could be due to lapse of time    since the last dose or unusual pharmacokinetics (rapid metabolism).  ==================================================================== Test                      Result    Flag   Units      Ref Range   Creatinine              114              mg/dL      >=20 ==================================================================== Declared Medications:  The flagging and interpretation on this report are based on the  following declared medications.  Unexpected results may arise from  inaccuracies in the declared medications.   **Note: The testing scope of this panel includes these medications:   Oxycodone   **  Note: The testing scope of this panel does not include the  following reported medications:   Albuterol (Proair HFA)  Baclofen (Lioresal)  Escitalopram (Lexapro)  Gabapentin (Neurontin)  Magnesium (Mag-Ox)  Methocarbamol (Robaxin)  Propranolol (Inderal)  Sucralfate (Carafate)  Topiramate (Topamax)  Turmeric ==================================================================== For clinical consultation, please call [phone]. ====================================================================     Laboratory Chemistry Profile   Renal Lab Results  Component Value Date   BUN 15 12/30/2019   CREATININE 0.73 12/30/2019   BCR 12 09/09/2019   GFRAA >60 12/30/2019   GFRNONAA >60 12/30/2019     Hepatic Lab Results  Component Value Date   AST 15 12/30/2019   ALT 13 12/30/2019    ALBUMIN 3.9 12/30/2019   ALKPHOS 67 12/30/2019   LIPASE 20 12/30/2019     Electrolytes Lab Results  Component Value Date   NA 138 12/30/2019   K 3.9 12/30/2019   CL 105 12/30/2019   CALCIUM 8.9 12/30/2019   MG 1.8 09/09/2019     Bone Lab Results  Component Value Date   25OHVITD1 33 09/09/2019   25OHVITD2 <1.0 09/09/2019   25OHVITD3 33 09/09/2019     Inflammation (CRP: Acute Phase) (ESR: Chronic Phase) Lab Results  Component Value Date   CRP 2 09/09/2019   ESRSEDRATE 7 09/09/2019       Note: Above Lab results reviewed.  Imaging  DG PAIN CLINIC C-ARM 1-60 MIN NO REPORT Fluoro was used, but no Radiologist interpretation will be provided.  Please refer to "NOTES" tab for provider progress note.  Assessment  The primary encounter diagnosis was Chronic pain syndrome. Diagnoses of Chronic knee pain (2ry area of Pain) (Bilateral) (L>R), Chronic low back pain (1ry area of Pain) (Bilateral) (Midline) (L>R) w/o sciatica, Chronic neck pain (3ry area of Pain) (Bilateral) (L>R), Pharmacologic therapy, Chronic use of opiate for therapeutic purpose, Osteoarthritis of knee (Left), Chronic knee pain (Left), and Osteoarthritis of the knees (Bilateral) were also pertinent to this visit.  Plan of Care  Problem-specific:  No problem-specific Assessment & Plan notes found for this encounter.  Ms. SHILAH HEFEL has a current medication list which includes the following long-term medication(s): albuterol, escitalopram, gabapentin, gabapentin, magnesium oxide, oxycodone, [START ON 12/15/2020] oxycodone, [START ON 01/14/2021] oxycodone, proair hfa, propranolol, sucralfate, topiramate, turmeric, and methocarbamol.  Pharmacotherapy (Medications Ordered): No orders of the defined types were placed in this encounter.  Orders:  Orders Placed This Encounter  Procedures   MR KNEE LEFT WO CONTRAST    Standing Status:   Future    Standing Expiration Date:   12/30/2020    Scheduling Instructions:      Imaging must be done as soon as possible. Inform patient that order will expire within 30 days and I will not renew it.    Order Specific Question:   What is the patient's sedation requirement?    Answer:   No Sedation    Order Specific Question:   Does the patient have a pacemaker or implanted devices?    Answer:   No    Order Specific Question:   Preferred imaging location?    Answer:   [facility] (table limit-350lbs)    Order Specific Question:   Call Results- Best Contact Number?    Answer:   [phone] ([facility])    Order Specific Question:   Radiology Contrast Protocol - do NOT remove file path    Answer:   \\[email or web address]   MR KNEE RIGHT WO CONTRAST    Standing Status:  Future    Standing Expiration Date:   12/30/2020    Scheduling Instructions:     Imaging must be done as soon as possible. Inform patient that order will expire within 30 days and I will not renew it.    Order Specific Question:   What is the patient's sedation requirement?    Answer:   No Sedation    Order Specific Question:   Does the patient have a pacemaker or implanted devices?    Answer:   No    Order Specific Question:   Preferred imaging location?    Answer:   [facility] (table limit-350lbs)    Order Specific Question:   Call Results- Best Contact Number?    Answer:   [phone] ([facility])    Order Specific Question:   Radiology Contrast Protocol - do NOT remove file path    Answer:   \\[email or web address]    Follow-up plan:   Return for scheduled encounter to review MRI.     Interventional Therapies  Risk  Complexity Considerations:   Estimated body mass index is 23.21 kg/m as calculated from the following:   Height as of this encounter: _0  (1.6 m).   Weight as of this encounter: 131 lb (59.4 kg). WNL   Planned  Pending:      Under consideration:   Diagnostic left lumbar ESI  Possible  bilateral lumbar facet RFA  Diagnostic bilateral intra-articular knee joint injection  Possible bilateral Hyalgan knee injections  Diagnostic bilateral genicular nerve block  Possible bilateral genicular nerve RFA  Diagnostic left cervical ESI  Diagnostic bilateral cervical facet block  Possible bilateral cervical facet RFA    Completed:   Diagnostic bilateral lumbar facet block x2 (08/10/2020)  Diagnostic bilateral IA steroid (Zilretta) knee injection x1 (11/16/2020)    Therapeutic  Palliative (PRN) options:   None established     Recent Visits Date Type Provider Dept  11/16/20 Procedure visit Milinda Pointer, MD [facility]  11/15/20 Office Visit Milinda Pointer, MD [facility]  Showing recent visits within past 90 days and meeting all other requirements Today's Visits Date Type Provider Dept  11/30/20 Telemedicine Milinda Pointer, MD [facility]  Showing today's visits and meeting all other requirements Future Appointments Date Type Provider Dept  02/09/21 Appointment Milinda Pointer, MD [facility]  Showing future appointments within next 90 days and meeting all other requirements I discussed the assessment and treatment plan with the patient. The patient was provided an opportunity to ask questions and all were answered. The patient agreed with the plan and demonstrated an understanding of the instructions.  Patient advised to call back or seek an in-person evaluation if the symptoms or condition worsens.  Duration of encounter: 12 minutes.  Note by: Gaspar Cola, MD Date: 11/30/2020; Time: 1:29 PM

## 2020-12-01 ENCOUNTER — Other Ambulatory Visit: Payer: Self-pay

## 2020-12-02 ENCOUNTER — Other Ambulatory Visit: Payer: Self-pay

## 2020-12-02 MED ORDER — PROPRANOLOL HCL 20 MG PO TABS
ORAL_TABLET | ORAL | 3 refills | Status: DC
  Filled 2020-12-02: qty 60, 30d supply, fill #0
  Filled 2021-01-21: qty 60, 30d supply, fill #1
  Filled 2021-03-01: qty 60, 30d supply, fill #2
  Filled 2021-04-04: qty 60, 30d supply, fill #3

## 2020-12-03 ENCOUNTER — Other Ambulatory Visit: Payer: Self-pay

## 2020-12-09 ENCOUNTER — Ambulatory Visit
Admission: RE | Admit: 2020-12-09 | Discharge: 2020-12-09 | Disposition: A | Discharge status: Home / Self Care | Payer: Self-pay | Source: Ambulatory Visit | Attending: Pain Medicine | Admitting: Pain Medicine

## 2020-12-09 ENCOUNTER — Other Ambulatory Visit: Payer: Self-pay

## 2020-12-09 DIAGNOSIS — M25562 Pain in left knee: Secondary | ICD-10-CM | POA: Insufficient documentation

## 2020-12-09 DIAGNOSIS — G8929 Other chronic pain: Secondary | ICD-10-CM

## 2020-12-09 DIAGNOSIS — M25561 Pain in right knee: Secondary | ICD-10-CM | POA: Insufficient documentation

## 2020-12-09 DIAGNOSIS — M1712 Unilateral primary osteoarthritis, left knee: Secondary | ICD-10-CM | POA: Insufficient documentation

## 2020-12-14 ENCOUNTER — Telehealth: Payer: Self-pay

## 2020-12-14 NOTE — Telephone Encounter (Signed)
She had MRI done, needs follow-up VV to review MRI results.

## 2020-12-14 NOTE — Telephone Encounter (Signed)
Pt states she has noticed swelling in her left knee and foot since her last appt with [place] and was told her knee was swollen. She wants to know what to do about the swelling.

## 2020-12-20 ENCOUNTER — Other Ambulatory Visit: Payer: Self-pay

## 2020-12-20 MED FILL — Albuterol Sulfate Inhal Aero 108 MCG/ACT (90MCG Base Equiv): RESPIRATORY_TRACT | 45 days supply | Qty: 25.5 | Fill #1 | Status: AC

## 2020-12-21 ENCOUNTER — Encounter: Payer: Self-pay | Admitting: Pain Medicine

## 2020-12-21 NOTE — Progress Notes (Signed)
Patient: Natasha Hanson  Service Category: E/M  Provider: Gaspar Cola, MD  DOB: [birth year]  DOS: 12/22/2020  Location: Office  MRN: [id number]  Setting: Ambulatory outpatient  Referring Provider: No ref. provider found  Type: Established Patient  Specialty: Interventional Pain Management  PCP: Pcp, No  Location: Remote location  Delivery: TeleHealth     Virtual Encounter - Pain Management PROVIDER NOTE: Information contained herein reflects review and annotations entered in association with encounter. Interpretation of such information and data should be left to medically-trained personnel. Information provided to patient can be located elsewhere in the medical record under "Patient Instructions". Document created using STT-dictation technology, any transcriptional errors that may result from process are unintentional.    Contact & Pharmacy Preferred: [phone] Home: [phone] (home) Mobile: [phone] (mobile) E-mail: [email or web address] [id number], [place] [address] Phone: [phone] Fax: [phone]  [facility] [address], [place] [address] Phone: [phone] Fax: [phone]   Pre-screening  Ms. Manrique offered "in-person" vs "virtual" encounter. She indicated preferring virtual for this encounter.   Reason COVID-19*  Social distancing based on CDC and AMA recommendations.   I contacted Natasha Hanson on 12/22/2020 via telephone.      I clearly identified myself as Gaspar Cola, MD. I verified that I was speaking with the correct person using two identifiers (Name: Natasha Hanson, and date of birth: May 13, 1966).  Consent I sought verbal advanced consent from Natasha Hanson for virtual visit interactions. I informed Natasha Hanson of possible security and privacy concerns, risks, and limitations associated with providing  "not-in-person" medical evaluation and management services. I also informed Natasha Hanson of the availability of "in-person" appointments. Finally, I informed her that there would be a charge for the virtual visit and that she could be  personally, fully or partially, financially responsible for it. Natasha Hanson expressed understanding and agreed to proceed.   Historic Elements   Natasha Hanson is a 55 y.o. year old, female patient evaluated today after our last contact on 11/16/2020. Natasha Hanson  has a past medical history of Allergy, Arthritis, COPD (chronic obstructive pulmonary disease) ([place]), and Glaucoma. She also  has a past surgical history that includes Refractive surgery; Abdominal hysterectomy; Foot surgery; and Foot Fusion. Natasha Hanson has a current medication list which includes the following prescription(s): albuterol, baclofen, escitalopram, magnesium oxide, methocarbamol, oxycodone, [START ON 01/14/2021] oxycodone, proair hfa, propranolol, propranolol, sucralfate, topiramate, turmeric, gabapentin, and oxycodone. She  reports that she has been smoking. She has never used smokeless tobacco. She reports that she does not drink alcohol. No history on file for drug use. Natasha Hanson has No Known Allergies.   HPI  Today, she is being contacted for follow-up evaluation to review bilateral knee MRIs.  The right knee MRI indicates mild osteoarthritis most notable in the patellofemoral compartment with fraying along the free edge of the body of the lateral meniscus.  The left knee MRI indicates mild osteoarthritis most notable in the lateral compartment with fraying along the free edge of the body of the lateral meniscus.  No meniscal or ligament tear in either knee.  The patient had a bilateral Zilretta (extended release triamcinolone acetate) injection done on 11/16/2020.  By 11/30/2020 she was having 75% ongoing relief of the pain on the right knee, but on the left she was still having pain.  In addition,  today we reviewed the results from her Zilretta knee injections and although the patient indicates that she is still doing better than before the injections, she is still having pain and wants to know if there is anything else that we can do for her.  Today I have talked to her about the option of the "gel injections" and the fact that we have 2 options, they Hyalgan and then Monovisc.  The patient indicated that she would be interested in the Hyalgan since it may have the potential to provide her with a longer lasting benefit than the Monovisc even though it involves more injections.  Regarding her knee MRIs, she had some questions as to whether or not she would be an adequate surgical candidate and rather than providing her with misinformation I have decided to refer her to orthopedic surgery for them to evaluate and see if there is anything that they can do for her from the surgical standpoint.  Meanwhile, we will go ahead and schedule her to come in for bilateral intra-articular Hyalgan knee injections to see if that can provide her with some benefit.  In addition to that, today we talked about her medications and it would seem that she was provided with a prescription for gabapentin 600 mg were she is taking it 3 times daily but she refers that she could use a fourth dose.  I reviewed my last prescription it turns out that it was for 600 mg 4 times daily but somehow she was provided with a prescription where the bottle says 3 times daily.  In any case, today I will be sending a refill that she can take 4 times daily, if tolerated.  Pharmacotherapy Assessment  Analgesic: Oxycodone IR 5 mg every 8 hours (15 mg/day of oxycodone) Highest recorded MME/day: 30 mg/day MME/day: 22.5 mg/day   Monitoring: [place] PMP: PDMP reviewed during this encounter.       Pharmacotherapy: No side-effects or adverse reactions reported. Compliance: No problems identified. Effectiveness: Clinically acceptable. Plan: Refer to  "POC".  UDS:  Summary  Date Value Ref Range Status  11/15/2020 Note  Final    Comment:    ==================================================================== [employer] [facility] 13 (MW) ==================================================================== Test                             Result       Flag       Units  Drug Present and Declared for Prescription Verification   Oxymorphone                    199          EXPECTED   ng/mg creat   Noroxycodone                   133          EXPECTED   ng/mg creat    Oxymorphone and noroxycodone are expected metabolites of oxycodone.    Sources of oxycodone are scheduled prescription medications.    Oxymorphone is also available as a scheduled prescription medication.  Drug Absent but Declared for Prescription Verification   Oxycodone                      Not Detected UNEXPECTED ng/mg creat    Oxycodone is almost always present in patients taking this drug    consistently.  Absence of oxycodone could be due to lapse of time  since the last dose or unusual pharmacokinetics (rapid metabolism).  ==================================================================== Test                      Result    Flag   Units      Ref Range   Creatinine              114              mg/dL      >=20 ==================================================================== Declared Medications:  The flagging and interpretation on this report are based on the  following declared medications.  Unexpected results may arise from  inaccuracies in the declared medications.   **Note: The testing scope of this panel includes these medications:   Oxycodone   **Note: The testing scope of this panel does not include the  following reported medications:   Albuterol (Proair HFA)  Baclofen (Lioresal)  Escitalopram (Lexapro)  Gabapentin (Neurontin)  Magnesium (Mag-Ox)  Methocarbamol (Robaxin)  Propranolol (Inderal)  Sucralfate (Carafate)  Topiramate (Topamax)   Turmeric ==================================================================== For clinical consultation, please call [phone]. ====================================================================     Laboratory Chemistry Profile   Renal Lab Results  Component Value Date   BUN 15 12/30/2019   CREATININE 0.73 12/30/2019   BCR 12 09/09/2019   GFRAA >60 12/30/2019   GFRNONAA >60 12/30/2019    Hepatic Lab Results  Component Value Date   AST 15 12/30/2019   ALT 13 12/30/2019   ALBUMIN 3.9 12/30/2019   ALKPHOS 67 12/30/2019   LIPASE 20 12/30/2019    Electrolytes Lab Results  Component Value Date   NA 138 12/30/2019   K 3.9 12/30/2019   CL 105 12/30/2019   CALCIUM 8.9 12/30/2019   MG 1.8 09/09/2019    Bone Lab Results  Component Value Date   25OHVITD1 33 09/09/2019   25OHVITD2 <1.0 09/09/2019   25OHVITD3 33 09/09/2019    Inflammation (CRP: Acute Phase) (ESR: Chronic Phase) Lab Results  Component Value Date   CRP 2 09/09/2019   ESRSEDRATE 7 09/09/2019       Note: Above Lab results reviewed.  Imaging  MR KNEE RIGHT WO CONTRAST CLINICAL DATA:  Bilateral knee pain for 3 years.  No known injury.  EXAM: MRI OF THE RIGHT KNEE WITHOUT CONTRAST  TECHNIQUE: Multiplanar, multisequence MR imaging of the knee was performed. No intravenous contrast was administered.  COMPARISON:  Plain films right knee 09/09/2019.  FINDINGS: MENISCI  Medial meniscus:  Intact.  Lateral meniscus: Intact. Mild fraying along the free edge of the body noted.  LIGAMENTS  Cruciates:  Intact.  Collaterals:  Intact.  CARTILAGE  Patellofemoral: There is thinning and fraying of hyaline cartilage at the apex of the patella along the lateral facet.  Medial:  Minimally degenerated.  No focal defect.  Lateral:  Mildly degenerated.  Joint:  There is amount of joint fluid.  Popliteal Fossa:  No Baker's cyst.  Extensor Mechanism: Intact. Mild spurring off the superior pole  of the patella at the quadriceps tendon insertion noted.  Bones: No fracture or worrisome lesion. Minimal osteophytosis is seen about the knee.  Other: None.  IMPRESSION: Negative for meniscal or ligament tear.  Mild osteoarthritis about the knee appears most notable in the patellofemoral compartment. Fraying along the free edge of the body of the lateral meniscus is noted.  Electronically Signed   By: Inge Rise M.D.   On: 12/10/2020 10:09   MR KNEE LEFT WO CONTRAST CLINICAL DATA:  Bilateral knee pain  for 3 years.  No known injury.  EXAM: MRI OF THE LEFT KNEE WITHOUT CONTRAST  TECHNIQUE: Multiplanar, multisequence MR imaging of the knee was performed. No intravenous contrast was administered.  COMPARISON:  Plain films left knee 09/09/2019.  FINDINGS: MENISCI  Medial meniscus:  Intact.  Lateral meniscus: Intact. There is some fraying along the free edge of the body.  LIGAMENTS  Cruciates:  Intact.  Collaterals:  Intact.  CARTILAGE  Patellofemoral: Mild fraying of the hyaline cartilage surface is most notable at the patellar apex.  Medial:  Preserved.  Lateral:  Mildly frayed.  Joint:  Trace amount of joint fluid.  Popliteal Fossa:  No Baker's cyst.  Extensor Mechanism: Intact. Mild spurring at the quadriceps tendon insertion on the superior pole of the patella noted.  Bones: No fracture, stress change or worrisome lesion. Small osteophytes about the knee are most notable in the lateral compartment.  Other: None.  IMPRESSION: Negative for meniscal or ligament tear.  Mild osteoarthritis is most notable in the lateral compartment. Fraying along the free edge of the body of the lateral meniscus noted.  Electronically Signed   By: Inge Rise M.D.   On: 12/10/2020 10:06  Assessment  The primary encounter diagnosis was Chronic pain syndrome. Diagnoses of Chronic low back pain (1ry area of Pain) (Bilateral) (Midline) (L>R) w/o  sciatica, Chronic knee pain (2ry area of Pain) (Bilateral) (L>R), Chronic neck pain (3ry area of Pain) (Bilateral) (L>R), Osteoarthritis of the knees (Bilateral), and Neurogenic pain were also pertinent to this visit.  Plan of Care  Problem-specific:  No problem-specific Assessment & Plan notes found for this encounter.  Natasha Hanson has a current medication list which includes the following long-term medication(s): albuterol, escitalopram, magnesium oxide, methocarbamol, oxycodone, [START ON 01/14/2021] oxycodone, proair hfa, propranolol, propranolol, sucralfate, topiramate, turmeric, gabapentin, and oxycodone.  Pharmacotherapy (Medications Ordered): Meds ordered this encounter  Medications   gabapentin (NEURONTIN) 600 MG tablet    Sig: Take 1 tablet (600 mg total) by mouth in the morning, at noon, in the evening, and at bedtime.    Dispense:  120 tablet    Refill:  2    Fill one day early if pharmacy is closed on scheduled refill date. Generic permitted. Do not send renewal requests. Void any older duplicate prescription or refill(s) that may be on file.    Orders:  Orders Placed This Encounter  Procedures   KNEE INJECTION    Indications: Knee arthralgia (pain) due to osteoarthritis (OA) Imaging: None (CPT-20610) Nursing: Please request pharmacy to have Hyalgan (Hyaluronan/Hyaluronic acid) available in the office.    Standing Status:   Future    Standing Expiration Date:   01/22/2021    Scheduling Instructions:     Procedure: Knee injection Hyalgan (Hyaluronan/Hyaluronic acid)     Treatment No.: 1     Level: Intra-articular     Laterality: Bilateral Knee     Sedation: Patient's choice.     Timeframe: As soon as schedule allows    Order Specific Question:   Where will this procedure be performed?    Answer:   [facility]   Ambulatory referral to Orthopedic Surgery    Referral Priority:   Routine    Referral Type:   Surgical    Referral Reason:   Specialty Services  Required    Requested Specialty:   Orthopedic Surgery    Number of Visits Requested:   1    Follow-up plan:   Return for Procedure (  no sedation): (B) Hyalgan Knee inj. #1.     Interventional Therapies  Risk  Complexity Considerations:   Estimated body mass index is 23.21 kg/m as calculated from the following:   Height as of this encounter: _0  (1.6 m).   Weight as of this encounter: 131 lb (59.4 kg). WNL   Planned  Pending:      Under consideration:   Diagnostic left lumbar ESI  Possible bilateral lumbar facet RFA  Diagnostic bilateral intra-articular knee joint injection  Possible bilateral Hyalgan knee injections  Diagnostic bilateral genicular nerve block  Possible bilateral genicular nerve RFA  Diagnostic left cervical ESI  Diagnostic bilateral cervical facet block  Possible bilateral cervical facet RFA    Completed:   Diagnostic bilateral lumbar facet block x2 (08/10/2020)  Diagnostic bilateral IA steroid (Zilretta) knee injection x1 (11/16/2020)    Therapeutic  Palliative (PRN) options:   None established      Recent Visits Date Type Provider Dept  11/30/20 Telemedicine Milinda Pointer, MD [facility]  11/16/20 Procedure visit Milinda Pointer, MD [facility]  11/15/20 Office Visit Milinda Pointer, MD [facility]  Showing recent visits within past 90 days and meeting all other requirements Today's Visits Date Type Provider Dept  12/22/20 Telemedicine Milinda Pointer, MD [facility]  Showing today's visits and meeting all other requirements Future Appointments Date Type Provider Dept  02/09/21 Appointment Milinda Pointer, MD [facility]  Showing future appointments within next 90 days and meeting all other requirements I discussed the assessment and treatment plan with the patient. The patient was provided an opportunity to ask questions and all were answered. The patient agreed with the plan and  demonstrated an understanding of the instructions.  Patient advised to call back or seek an in-person evaluation if the symptoms or condition worsens.  Duration of encounter: 30 minutes.  Note by: Gaspar Cola, MD Date: 12/22/2020; Time: 5:50 PM

## 2020-12-22 ENCOUNTER — Other Ambulatory Visit: Payer: Self-pay

## 2020-12-22 ENCOUNTER — Ambulatory Visit: Payer: Self-pay | Attending: Pain Medicine | Admitting: Pain Medicine

## 2020-12-22 DIAGNOSIS — M542 Cervicalgia: Secondary | ICD-10-CM

## 2020-12-22 DIAGNOSIS — M170 Bilateral primary osteoarthritis of knee: Secondary | ICD-10-CM

## 2020-12-22 DIAGNOSIS — M25561 Pain in right knee: Secondary | ICD-10-CM

## 2020-12-22 DIAGNOSIS — M792 Neuralgia and neuritis, unspecified: Secondary | ICD-10-CM

## 2020-12-22 DIAGNOSIS — G894 Chronic pain syndrome: Secondary | ICD-10-CM

## 2020-12-22 DIAGNOSIS — M25562 Pain in left knee: Secondary | ICD-10-CM

## 2020-12-22 DIAGNOSIS — M545 Low back pain, unspecified: Secondary | ICD-10-CM

## 2020-12-22 DIAGNOSIS — G8929 Other chronic pain: Secondary | ICD-10-CM

## 2020-12-22 MED ORDER — GABAPENTIN 600 MG PO TABS
600.0000 mg | ORAL_TABLET | Freq: Four times a day (QID) | ORAL | 2 refills | Status: DC
  Filled 2020-12-22 – 2020-12-23 (×2): qty 120, 30d supply, fill #0
  Filled 2021-01-27: qty 120, 30d supply, fill #1
  Filled 2021-03-01: qty 120, 30d supply, fill #2

## 2020-12-22 NOTE — Patient Instructions (Signed)
____________________________________________________________________________________________  Preparing for your procedure (without sedation)  Procedure appointments are limited to planned procedures: . No Prescription Refills. . No disability issues will be discussed. . No medication changes will be discussed.  Instructions: . Oral Intake: Do not eat or drink anything for at least 6 hours prior to your procedure. (Exception: Blood Pressure Medication. See below.) . Transportation: Unless otherwise stated by your physician, you may drive yourself after the procedure. . Blood Pressure Medicine: Do not forget to take your blood pressure medicine with a sip of water the morning of the procedure. If your Diastolic (lower reading)is above 100 mmHg, elective cases will be cancelled/rescheduled. . Blood thinners: These will need to be stopped for procedures. Notify our staff if you are taking any blood thinners. Depending on which one you take, there will be specific instructions on how and when to stop it. . Diabetics on insulin: Notify the staff so that you can be scheduled 1st case in the morning. If your diabetes requires high dose insulin, take only  of your normal insulin dose the morning of the procedure and notify the staff that you have done so. . Preventing infections: Shower with an antibacterial soap the morning of your procedure.  . Build-up your immune system: Take 1000 mg of Vitamin C with every meal (3 times a day) the day prior to your procedure. . Antibiotics: Inform the staff if you have a condition or reason that requires you to take antibiotics before dental procedures. . Pregnancy: If you are pregnant, call and cancel the procedure. . Sickness: If you have a cold, fever, or any active infections, call and cancel the procedure. . Arrival: You must be in the facility at least 30 minutes prior to your scheduled procedure. . Children: Do not bring any children with you. . Dress  appropriately: Bring dark clothing that you would not mind if they get stained. . Valuables: Do not bring any jewelry or valuables.  Reasons to call and reschedule or cancel your procedure: (Following these recommendations will minimize the risk of a serious complication.) . Surgeries: Avoid having procedures within 2 weeks of any surgery. (Avoid for 2 weeks before or after any surgery). . Flu Shots: Avoid having procedures within 2 weeks of a flu shots or . (Avoid for 2 weeks before or after immunizations). . Barium: Avoid having a procedure within 7-10 days after having had a radiological study involving the use of radiological contrast. (Myelograms, Barium swallow or enema study). . Heart attacks: Avoid any elective procedures or surgeries for the initial 6 months after a "Myocardial Infarction" (Heart Attack). . Blood thinners: It is imperative that you stop these medications before procedures. Let us know if you if you take any blood thinner.  . Infection: Avoid procedures during or within two weeks of an infection (including chest colds or gastrointestinal problems). Symptoms associated with infections include: Localized redness, fever, chills, night sweats or profuse sweating, burning sensation when voiding, cough, congestion, stuffiness, runny nose, sore throat, diarrhea, nausea, vomiting, cold or Flu symptoms, recent or current infections. It is specially important if the infection is over the area that we intend to treat. . Heart and lung problems: Symptoms that may suggest an active cardiopulmonary problem include: cough, chest pain, breathing difficulties or shortness of breath, dizziness, ankle swelling, uncontrolled high or unusually low blood pressure, and/or palpitations. If you are experiencing any of these symptoms, cancel your procedure and contact your primary care physician for an evaluation.  Remember:  Regular   Business hours are:  Monday to Thursday 8:00 AM to 4:00  PM  Provider's Schedule: Camran Keady, MD:  Procedure days: Tuesday and Thursday 7:30 AM to 4:00 PM  Bilal Lateef, MD:  Procedure days: Monday and Wednesday 7:30 AM to 4:00 PM ____________________________________________________________________________________________    

## 2020-12-23 ENCOUNTER — Other Ambulatory Visit: Payer: Self-pay

## 2020-12-30 ENCOUNTER — Other Ambulatory Visit: Payer: Self-pay

## 2021-01-13 ENCOUNTER — Other Ambulatory Visit: Payer: Self-pay

## 2021-01-13 ENCOUNTER — Encounter: Payer: Self-pay | Admitting: Gerontology

## 2021-01-13 ENCOUNTER — Ambulatory Visit: Payer: Self-pay | Admitting: Gerontology

## 2021-01-13 VITALS — BP 126/82 | HR 49 | Temp 98.2°F | Resp 18 | Ht 65.0 in | Wt 128.0 lb

## 2021-01-13 DIAGNOSIS — Z8659 Personal history of other mental and behavioral disorders: Secondary | ICD-10-CM | POA: Insufficient documentation

## 2021-01-13 DIAGNOSIS — R251 Tremor, unspecified: Secondary | ICD-10-CM

## 2021-01-13 DIAGNOSIS — T7840XD Allergy, unspecified, subsequent encounter: Secondary | ICD-10-CM

## 2021-01-13 DIAGNOSIS — Z7689 Persons encountering health services in other specified circumstances: Secondary | ICD-10-CM

## 2021-01-13 DIAGNOSIS — M25572 Pain in left ankle and joints of left foot: Secondary | ICD-10-CM | POA: Insufficient documentation

## 2021-01-13 HISTORY — DX: Personal history of other mental and behavioral disorders: Z86.59

## 2021-01-13 MED ORDER — CETIRIZINE HCL 10 MG PO TABS
10.0000 mg | ORAL_TABLET | Freq: Every day | ORAL | 3 refills | Status: DC
Start: 2021-01-13 — End: 2021-07-19
  Filled 2021-01-13: qty 30, 30d supply, fill #0
  Filled 2021-03-01: qty 30, 30d supply, fill #1
  Filled 2021-04-04: qty 30, 30d supply, fill #2
  Filled 2021-06-07: qty 30, 30d supply, fill #3

## 2021-01-13 NOTE — Progress Notes (Signed)
New Patient Office Visit  Subjective:  Patient ID: Natasha Hanson, female    DOB: Dec 21, 1965  Age: 55 y.o. MRN: [id number]  CC:  Chief Complaint  Patient presents with   Establish Care    HPI Natasha Hanson is a 55 y/o female who has history of Allergy, Arthritis, COPD, Tremors of nervous system neck, presents to establish care and evaluation of her problems. She follows up at the pain clinic with Dr Delano Metz for management of her chronic pain syndrome. She also follows up with Neurology at [facility] for Essential Tremor to her neck. She was seen on 11/25/20 at [facility] Neurology by Dr Judie Petit.A [place], it was recommended that she continues propranolol 20 mg bid, Topamax 50 mg TID and Gabapentin 600 mg TID. She states that she has a history of anxiety and depression and taking Lexapro was not effective. She was teary during visit stating that she didn't want to have a nervous break down. She denies suicidal nor homicidal ideation. She c/o left lower leg/foot swelling that started 3 weeks ago. She denies any injury, but states that swelling resolves when she wakes up and swells back again during the day. She endorses mild claudication, and erythema. Overall, she states that she's concerned about the edema to her lower leg.   Past Medical History:  Diagnosis Date   Allergy    Arthritis    COPD (chronic obstructive pulmonary disease) (HCC)    Glaucoma    Tremors of nervous system    neck    Past Surgical History:  Procedure Laterality Date   ABDOMINAL HYSTERECTOMY     FOOT FUSION     FOOT SURGERY     REFRACTIVE SURGERY      Family History  Problem Relation Age of Onset   Other Mother        unkown COD   Anxiety disorder Mother    Pneumonia Father    Atrial fibrillation Daughter    Other Maternal Grandmother        unkown medical history   Other Maternal Grandfather        unkown medical history   Other Paternal Grandmother        unkown medical history   Other Paternal  Grandfather        unkown medical history    Social History   Socioeconomic History   Marital status: Married    Spouse name: Not on file   Number of children: Not on file   Years of education: Not on file   Highest education level: Not on file  Occupational History   Not on file  Tobacco Use   Smoking status: Every Day    Packs/day: 1.00    Years: 40.00    Pack years: 40.00    Types: Cigarettes   Smokeless tobacco: Never  Vaping Use   Vaping Use: Never used  Substance and Sexual Activity   Alcohol use: Yes    Comment: drinks occasionally   Drug use: Never   Sexual activity: Not on file  Other Topics Concern   Not on file  Social History Narrative   Not on file   Social Determinants of Health   Financial Resource Strain: High Risk   Difficulty of Paying Living Expenses: Very hard  Food Insecurity: No Food Insecurity   Worried About Running Out of Food in the Last Year: Never true   Ran Out of Food in the Last Year: Never true  Transportation Needs: No  Transportation Needs   Lack of Transportation (Medical): No   Lack of Transportation (Non-Medical): No  Physical Activity: Insufficiently Active   Days of Exercise per Week: 3 days   Minutes of Exercise per Session: 30 min  Stress: Stress Concern Present   Feeling of Stress : Very much  Social Connections: Socially Isolated   Frequency of Communication with Friends and Family: More than three times a week   Frequency of Social Gatherings with Friends and Family: Once a week   Attends Religious Services: Never   Database administrator or Organizations: No   Attends Banker Meetings: Never   Marital Status: Widowed  Intimate Partner Violence: Not on file    ROS Review of Systems  Constitutional: Negative.   HENT: Negative.    Eyes: Negative.   Respiratory: Negative.    Cardiovascular:  Positive for leg swelling (swelling to left ankle and foot).  Gastrointestinal: Negative.   Endocrine:  Negative.   Genitourinary: Negative.   Skin: Negative.   Neurological:  Positive for tremors (History of tremor to neck).  Hematological: Negative.   Psychiatric/Behavioral:  The patient is nervous/anxious.    Objective:   Today's Vitals: BP 126/82 (BP Location: Left Arm, Patient Position: Sitting, Cuff Size: Large)   Pulse (!) 49   Temp 98.2 F (36.8 C)   Resp 18   Ht 5\' 5"  (1.651 m)   Wt 128 lb (58.1 kg)   SpO2 97%   BMI 21.30 kg/m   Physical Exam Constitutional:      Appearance: Normal appearance.  HENT:     Head: Normocephalic and atraumatic.     Nose: Nose normal.     Mouth/Throat:     Mouth: Mucous membranes are moist.  Eyes:     Extraocular Movements: Extraocular movements intact.     Conjunctiva/sclera: Conjunctivae normal.     Pupils: Pupils are equal, round, and reactive to light.  Neck:     Comments: Mild tremor to neck Cardiovascular:     Rate and Rhythm: Bradycardia present.     Pulses: Normal pulses.     Heart sounds: Normal heart sounds.  Pulmonary:     Effort: Pulmonary effort is normal.     Breath sounds: Normal breath sounds.  Abdominal:     General: Abdomen is flat. Bowel sounds are normal.     Palpations: Abdomen is soft.  Genitourinary:    Comments: Deferred per patient. Musculoskeletal:        General: Normal range of motion.     Left lower leg: Left lower leg edema: +1 edema left lower leg, ankle, foot.  Skin:    General: Skin is warm.  Neurological:     General: No focal deficit present.     Mental Status: She is alert and oriented to person, place, and time. Mental status is at baseline.  Psychiatric:        Mood and Affect: Mood normal.        Behavior: Behavior normal.        Thought Content: Thought content normal.        Judgment: Judgment normal.    Assessment & Plan:   1. Pain and swelling of ankle, left -Unknown etiology to edema of left lower extremity, pulses present, skin is warm and no erythema. She will have  Ultrasound done to rule out DVT, if negative will likely refer to Vascular Surgery. She was encouraged to elevate leg while sitting and to go to the ED  for worsening symptoms. - US Venous Img Lower Unilateral Left (DVT); Future  2. History of depression -She will follow up with [facility] health Ms. Rhett Bannister -She was advised to call the crisis helpline with worsening symptoms.  3. Allergy, subsequent encounter -She will continue on current medication for her allergy. - cetirizine (ZYRTEC) 10 MG tablet; Take 1 tablet (10 mg total) by mouth once daily.  Dispense: 30 tablet; Refill: 3  4. Encounter to establish care -She will continue on all her medications and advised to notify the clinic with any concerns.  5. Tremor -She will continue on her gabapentin, Topamax and propranolol.  She was advised to continue to follow-up with [facility] neurology.      Follow-up: Return in about 6 days (around 01/19/2021), or if symptoms worsen or fail to improve.   Stefannie Defeo Trellis Paganini, NP

## 2021-01-14 ENCOUNTER — Ambulatory Visit
Admission: RE | Admit: 2021-01-14 | Discharge: 2021-01-14 | Disposition: A | Discharge status: Home / Self Care | Payer: Self-pay | Source: Ambulatory Visit | Attending: Gerontology | Admitting: Gerontology

## 2021-01-14 ENCOUNTER — Telehealth: Payer: Self-pay

## 2021-01-14 DIAGNOSIS — M25572 Pain in left ankle and joints of left foot: Secondary | ICD-10-CM | POA: Insufficient documentation

## 2021-01-14 DIAGNOSIS — M25472 Effusion, left ankle: Secondary | ICD-10-CM | POA: Insufficient documentation

## 2021-01-14 NOTE — Telephone Encounter (Signed)
Pt. Waiting for her Ultrasound order from her PCP "to get straightened out." States she is concerned she won't get procedure done today. Instructed pt. If her symptoms worsen go to ED.Verbalizes understanding.

## 2021-01-18 ENCOUNTER — Other Ambulatory Visit: Payer: Self-pay | Admitting: Gerontology

## 2021-01-18 ENCOUNTER — Telehealth: Payer: Self-pay

## 2021-01-18 ENCOUNTER — Other Ambulatory Visit: Payer: Self-pay

## 2021-01-18 ENCOUNTER — Ambulatory Visit: Payer: Self-pay

## 2021-01-18 DIAGNOSIS — M25572 Pain in left ankle and joints of left foot: Secondary | ICD-10-CM

## 2021-01-18 DIAGNOSIS — Z789 Other specified health status: Secondary | ICD-10-CM

## 2021-01-18 DIAGNOSIS — M25472 Effusion, left ankle: Secondary | ICD-10-CM

## 2021-01-18 NOTE — Telephone Encounter (Signed)
Called patient for her 10 am appointment to assess for and provide resources.

## 2021-01-19 ENCOUNTER — Other Ambulatory Visit: Payer: Self-pay

## 2021-01-19 ENCOUNTER — Ambulatory Visit: Payer: Self-pay | Admitting: Gerontology

## 2021-01-19 VITALS — BP 108/71 | HR 87 | Temp 98.6°F | Ht 63.0 in | Wt 127.8 lb

## 2021-01-19 DIAGNOSIS — H60501 Unspecified acute noninfective otitis externa, right ear: Secondary | ICD-10-CM

## 2021-01-19 DIAGNOSIS — M25572 Pain in left ankle and joints of left foot: Secondary | ICD-10-CM

## 2021-01-19 DIAGNOSIS — M25472 Effusion, left ankle: Secondary | ICD-10-CM

## 2021-01-19 DIAGNOSIS — H6091 Unspecified otitis externa, right ear: Secondary | ICD-10-CM

## 2021-01-19 DIAGNOSIS — H6691 Otitis media, unspecified, right ear: Secondary | ICD-10-CM | POA: Insufficient documentation

## 2021-01-19 DIAGNOSIS — L989 Disorder of the skin and subcutaneous tissue, unspecified: Secondary | ICD-10-CM | POA: Insufficient documentation

## 2021-01-19 HISTORY — DX: Unspecified otitis externa, right ear: H60.91

## 2021-01-19 MED ORDER — NEOMYCIN-POLYMYXIN-HC 3.5-10000-1 OT SOLN
4.0000 [drp] | Freq: Three times a day (TID) | OTIC | 0 refills | Status: DC
Start: 2021-01-19 — End: 2021-01-19
  Filled 2021-01-19: qty 10, 7d supply, fill #0

## 2021-01-19 MED ORDER — TRIAMCINOLONE ACETONIDE 0.1 % EX CREA
1.0000 | TOPICAL_CREAM | Freq: Two times a day (BID) | CUTANEOUS | 0 refills | Status: DC
Start: 2021-01-19 — End: 2021-08-04
  Filled 2021-01-19: qty 30, 15d supply, fill #0

## 2021-01-19 MED ORDER — AMOXICILLIN-POT CLAVULANATE 875-125 MG PO TABS
1.0000 | ORAL_TABLET | Freq: Two times a day (BID) | ORAL | 0 refills | Status: DC
  Filled 2021-01-19: qty 10, 5d supply, fill #0

## 2021-01-19 NOTE — Progress Notes (Signed)
bnp

## 2021-01-19 NOTE — Patient Instructions (Signed)
Edema  Edema is when you have too much fluid in your body or under your skin. Edema may make your legs, feet, and ankles swell up. Swelling is also common in looser tissues, like around your eyes. This is a common condition. It gets more common as you get older. There are many possible causes of edema. Eating too much salt (sodium) and being on your feet or sitting for a long time can cause edema in yourlegs, feet, and ankles. Hot weather may make edema worse. Edema is usually painless. Your skin may look swollen or shiny. Follow these instructions at home: Keep the swollen body part raised (elevated) above the level of your heart when you are sitting or lying down. Do not sit still or stand for a long time. Do not wear tight clothes. Do not wear garters on your upper legs. Exercise your legs. This can help the swelling go down. Wear elastic bandages or support stockings as told by your doctor. Eat a low-salt (low-sodium) diet to reduce fluid as told by your doctor. Depending on the cause of your swelling, you may need to limit how much fluid you drink (fluid restriction). Take over-the-counter and prescription medicines only as told by your doctor. Contact a doctor if: Treatment is not working. You have heart, liver, or kidney disease and have symptoms of edema. You have sudden and unexplained weight gain. Get help right away if: You have shortness of breath or chest pain. You cannot breathe when you lie down. You have pain, redness, or warmth in the swollen areas. You have heart, liver, or kidney disease and get edema all of a sudden. You have a fever and your symptoms get worse all of a sudden. Summary Edema is when you have too much fluid in your body or under your skin. Edema may make your legs, feet, and ankles swell up. Swelling is also common in looser tissues, like around your eyes. Raise (elevate) the swollen body part above the level of your heart when you are sitting or lying  down. Follow your doctor's instructions about diet and how much fluid you can drink (fluid restriction). This information is not intended to replace advice given to you by your health care provider. Make sure you discuss any questions you have with your healthcare provider. Document Revised: 03/24/2020 Document Reviewed: 03/24/2020 Elsevier Patient Education  2022 Elsevier Inc.  

## 2021-01-19 NOTE — Progress Notes (Signed)
Established Patient Office Visit  Subjective:  Patient ID: Natasha Hanson, female    DOB: 19-Nov-1965  Age: 55 y.o. MRN: [id number]  CC:  Chief Complaint  Patient presents with   Follow-up    Follow up from left ankle swelling addressed at last visit. Pt had scan done 01/14/2021. Pt now reports swelling of the right ankle as well. Pt has "dry itchy skin" in between toes and on bottom of feet. Pt is concerned about weight loss. Pt experiencing ear pain in the right ear "feels like it has water in it".    HPI Natasha Hanson is a 55 y/o female who has history of Allergy, Arthritis, COPD, Tremors of nervous system neck,resents for routine follow up and lab draw. She states that her left leg is still swollen, and swelling increases as the day progresses. She experiences intermittent claudication, but denies erythema. She had Doppler Ultrasound on 01/14/21 and it showed No evidence of left lower extremity deep venous thrombosis.  She states that she noticed swelling to her right foot. She also c/o dry , itchy scartered circular scally area on the soles of her feet that started 4 days ago. She denies changes to her laundry soap, cream or contact with irritants.  She also c/o otalgia to her right ear that started 3 days ago. She reports fluid filled sensation inside her ear. She denies fever, chills, tinnitus, and rhinorrhea. Overall, she states that she's doing well but concerned about edema to her left leg.    Past Medical History:  Diagnosis Date   Allergy    Arthritis    COPD (chronic obstructive pulmonary disease) (HCC)    Glaucoma    Tremors of nervous system    neck    Past Surgical History:  Procedure Laterality Date   ABDOMINAL HYSTERECTOMY     FOOT FUSION     FOOT SURGERY     REFRACTIVE SURGERY      Family History  Problem Relation Age of Onset   Other Mother        unkown COD   Anxiety disorder Mother    Pneumonia Father    Atrial fibrillation Daughter    Other Maternal  Grandmother        unkown medical history   Other Maternal Grandfather        unkown medical history   Other Paternal Grandmother        unkown medical history   Other Paternal Grandfather        unkown medical history    Social History   Socioeconomic History   Marital status: Married    Spouse name: Not on file   Number of children: Not on file   Years of education: Not on file   Highest education level: Not on file  Occupational History   Not on file  Tobacco Use   Smoking status: Every Day    Packs/day: 1.00    Years: 40.00    Pack years: 40.00    Types: Cigarettes   Smokeless tobacco: Never  Vaping Use   Vaping Use: Never used  Substance and Sexual Activity   Alcohol use: Yes    Comment: drinks occasionally   Drug use: Never   Sexual activity: Not on file  Other Topics Concern   Not on file  Social History Narrative   Not on file   Social Determinants of Health   Financial Resource Strain: High Risk   Difficulty of Paying Living Expenses: Very  hard  Food Insecurity: No Food Insecurity   Worried About Programme researcher, broadcasting/film/video in the Last Year: Never true   Ran Out of Food in the Last Year: Never true  Transportation Needs: Unmet Transportation Needs   Lack of Transportation (Medical): No   Lack of Transportation (Non-Medical): Yes  Physical Activity: Inactive   Days of Exercise per Week: 0 days   Minutes of Exercise per Session: 0 min  Stress: Stress Concern Present   Feeling of Stress : Very much  Social Connections: Socially Isolated   Frequency of Communication with Friends and Family: Once a week   Frequency of Social Gatherings with Friends and Family: Once a week   Attends Religious Services: Never   Database administrator or Organizations: No   Attends Banker Meetings: Never   Marital Status: Widowed  Intimate Partner Violence: Not on file    Outpatient Medications Prior to Visit  Medication Sig Dispense Refill   baclofen  (LIORESAL) 10 MG tablet Take (1/2) tablet (5mg  total) by mouth twice daily. 90 tablet 0   cetirizine (ZYRTEC) 10 MG tablet Take 1 tablet (10 mg total) by mouth once daily. 30 tablet 3   escitalopram (LEXAPRO) 10 MG tablet Take 1 tablet (10 mg total) by mouth daily for mood. (Patient taking differently: Take 10 mg by mouth at bedtime.) 30 tablet 4   gabapentin (NEURONTIN) 600 MG tablet TAKE ONE TABLET (600MG  TOTAL) BY MOUTH FOUR TIMES DAILY. (IN THE MORNING, NOON, EVENING, AND AT BEDTIME). 120 tablet 2   PROAIR HFA 108 (90 Base) MCG/ACT inhaler INHALE 2 TO 4 PUFFS BY MOUTH EVERY 4 HOURS AS NEEDED (Patient taking differently: Needing two-three times daily during summer months) 51 g 99   propranolol (INDERAL) 20 MG tablet Take 1 tablet (20 mg total) by mouth Two (2) times a day. 60 tablet 3   topiramate (TOPAMAX) 50 MG tablet Take 1 tablet (50 mg) in the morning, half a tablet (25 mg) in the afternoon and 1 tablet (50 mg) at night for 2 weeks. Then continue taking 1 tablet (50 mg) three times a day. (Patient taking differently: Patient is taking 50 mg three times a day) 90 tablet 2   Vitamin D, Cholecalciferol, 25 MCG (1000 UT) TABS Take 1,000 Units by mouth daily.     oxyCODONE (OXY IR/ROXICODONE) 5 MG immediate release tablet Take 1 tablet (5 mg total) by mouth every 8 (eight) hours as needed for severe pain. Must last 30 days. 90 tablet 0   oxyCODONE (OXY IR/ROXICODONE) 5 MG immediate release tablet Take 1 tablet (5 mg total) by mouth every 8 (eight) hours as needed for severe pain. Must last 30 days. 90 tablet 0   oxyCODONE (OXY IR/ROXICODONE) 5 MG immediate release tablet Take 1 tablet (5 mg total) by mouth every 8 (eight) hours as needed for severe pain. Must last 30 days. 90 tablet 0   sucralfate (CARAFATE) 1 g tablet Take 1 tablet (1 g total) by mouth 4 (four) times daily -  with meals and at bedtime. 120 tablet 0   Turmeric 500 MG CAPS Take 500 mg by mouth daily. 30 capsule 2   No  facility-administered medications prior to visit.    Allergies  Allergen Reactions   No Known Allergies     ROS Review of Systems  Constitutional: Negative.   Respiratory: Negative.    Cardiovascular:  Positive for leg swelling (left leg).  Neurological: Negative.   Psychiatric/Behavioral: Negative.  Objective:    Physical Exam HENT:     Head: Normocephalic and atraumatic.  Cardiovascular:     Rate and Rhythm: Normal rate and regular rhythm.     Pulses: Normal pulses.     Heart sounds: Normal heart sounds.  Pulmonary:     Effort: Pulmonary effort is normal.     Breath sounds: Normal breath sounds.  Musculoskeletal:        General: Swelling (trace edema to dorsal surface of right foot) present.     Left lower leg: Edema (+1 edema to left lower leg,+2 to left foot) present.  Skin:    Comments: Dry, scaly scattered flat lesion to soles of feet.  Neurological:     General: No focal deficit present.     Mental Status: She is alert and oriented to person, place, and time. Mental status is at baseline.  Psychiatric:        Mood and Affect: Mood normal.        Behavior: Behavior normal.        Thought Content: Thought content normal.        Judgment: Judgment normal.    BP 108/71 (BP Location: Right Arm, Patient Position: Sitting, Cuff Size: Normal)   Pulse 87   Temp 98.6 F (37 C)   Ht 5\' 3"  (1.6 m)   Wt 127 lb 12.8 oz (58 kg)   SpO2 97%   BMI 22.64 kg/m  Wt Readings from Last 3 Encounters:  01/19/21 127 lb 12.8 oz (58 kg)  01/19/21 127 lb 12.8 oz (58 kg)  01/13/21 128 lb (58.1 kg)     Health Maintenance Due  Topic Date Due   COVID-19 Vaccine (1) Never done   Pneumococcal Vaccine 870-576 Years old (1 - PCV) Never done   HIV Screening  Never done   Hepatitis C Screening  Never done   Zoster Vaccines- Shingrix (1 of 2) Never done   PAP SMEAR-Modifier  Never done   COLONOSCOPY (Pts 45-4530yrs Insurance coverage will need to be confirmed)  Never done    MAMMOGRAM  Never done   INFLUENZA VACCINE  01/10/2021    There are no preventive care reminders to display for this patient.  Lab Results  Component Value Date   TSH 0.967 02/15/2019   Lab Results  Component Value Date   WBC 11.5 (H) 12/30/2019   HGB 14.7 12/30/2019   HCT 42.4 12/30/2019   MCV 98.4 12/30/2019   PLT 380 12/30/2019   Lab Results  Component Value Date   NA 138 12/30/2019   K 3.9 12/30/2019   CO2 25 12/30/2019   GLUCOSE 93 12/30/2019   BUN 15 12/30/2019   CREATININE 0.73 12/30/2019   BILITOT 1.0 12/30/2019   ALKPHOS 67 12/30/2019   AST 15 12/30/2019   ALT 13 12/30/2019   PROT 7.0 12/30/2019   ALBUMIN 3.9 12/30/2019   CALCIUM 8.9 12/30/2019   ANIONGAP 8 12/30/2019   No results found for: CHOL No results found for: HDL No results found for: LDLCALC No results found for: TRIG No results found for: CHOLHDL No results found for: [id number]    Assessment & Plan:     1. Pain and swelling of ankle, left- -Ultrasound done was negative for DVT, she was advised to wear compression stockings,elevate leg while sitting or lying down, complete [facility] financial application for - Ambulatory referral to Vascular Surgery  2. Acute otitis externa of right ear, unspecified type - Redness and opacified tympanic membrane were  present. She will continue on Augmentin, educated on medication side effects and advised to notify clinic. - amoxicillin-clavulanate (AUGMENTIN) 875-125 MG tablet; Take 1 tablet by mouth 2 (two) times daily.  Dispense: 10 tablet; Refill: 0  3. Skin lesion of foot - Unknown etiology of scattered, dry scaly lesion to soles of feet. She will continue on Triamcinolone, educated on medication side effects and to notify clinic. - triamcinolone cream (KENALOG) 0.1 %; Apply 1 application topically 2 (two) times daily.  Dispense: 30 g; Refill: 0    Follow-up: Return in about 4 weeks (around 02/16/2021), or if symptoms worsen or fail to improve.    Pheonix Wisby Trellis Paganini, NP

## 2021-01-20 ENCOUNTER — Institutional Professional Consult (permissible substitution): Payer: Self-pay | Admitting: Licensed Clinical Social Worker

## 2021-01-20 ENCOUNTER — Telehealth: Payer: Self-pay | Admitting: Emergency Medicine

## 2021-01-20 ENCOUNTER — Ambulatory Visit: Payer: Self-pay | Admitting: Licensed Clinical Social Worker

## 2021-01-20 DIAGNOSIS — F339 Major depressive disorder, recurrent, unspecified: Secondary | ICD-10-CM

## 2021-01-20 DIAGNOSIS — F411 Generalized anxiety disorder: Secondary | ICD-10-CM

## 2021-01-20 LAB — BRAIN NATRIURETIC PEPTIDE: BNP: 105.6 pg/mL — ABNORMAL HIGH (ref 0.0–100.0)

## 2021-01-20 LAB — D-DIMER, QUANTITATIVE: D-DIMER: 0.63 mg/L FEU — ABNORMAL HIGH (ref 0.00–0.49)
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
## 2021-01-20 NOTE — Telephone Encounter (Signed)
Called patient to notify that Medication Management did not have spacers for inhaler. Also advised patient that her blood work from yesterday (D-Dimer and BNP) were slightly elevated per Lanora Manis, NP. Advised patient to wear compression socks or an ace wrap to the affected leg and to make sure to go to appointment on Tuesday (01/25/21) to Vascular. Advised patient it she starting having any worsening symptoms, like numbness, increased swelling or coldness to her leg to go to an ED to be evaluated.  Patient agreed and voiced understanding. All of above per Lanora Manis, NP

## 2021-01-21 ENCOUNTER — Other Ambulatory Visit: Payer: Self-pay

## 2021-01-21 LAB — COMPREHENSIVE METABOLIC PANEL
ALT: 6 IU/L (ref 0–32)
AST: 13 IU/L (ref 0–40)
Albumin/Globulin Ratio: 1.6 (ref 1.2–2.2)
Albumin: 3.9 g/dL (ref 3.8–4.9)
Alkaline Phosphatase: 69 IU/L (ref 44–121)
BUN/Creatinine Ratio: 12 (ref 9–23)
BUN: 10 mg/dL (ref 6–24)
Bilirubin Total: 0.4 mg/dL (ref 0.0–1.2)
CO2: 19 mmol/L — ABNORMAL LOW (ref 20–29)
Calcium: 9.3 mg/dL (ref 8.7–10.2)
Chloride: 109 mmol/L — ABNORMAL HIGH (ref 96–106)
Creatinine, Ser: 0.83 mg/dL (ref 0.57–1.00)
Globulin, Total: 2.4 g/dL (ref 1.5–4.5)
Glucose: 75 mg/dL (ref 65–99)
Potassium: 4.8 mmol/L (ref 3.5–5.2)
Sodium: 141 mmol/L (ref 134–144)
Total Protein: 6.3 g/dL (ref 6.0–8.5)
eGFR: 83 mL/min/{1.73_m2} (ref >59)

## 2021-01-21 LAB — URINALYSIS
Bilirubin, UA: NEGATIVE
Glucose, UA: NEGATIVE
Ketones, UA: NEGATIVE
Leukocytes,UA: NEGATIVE
Nitrite, UA: NEGATIVE
Protein,UA: NEGATIVE
RBC, UA: NEGATIVE
Specific Gravity, UA: 1.008 (ref 1.005–1.030)
Urobilinogen, Ur: 0.2 mg/dL (ref 0.2–1.0)
pH, UA: 7.5 (ref 5.0–7.5)

## 2021-01-21 LAB — D-DIMER, QUANTITATIVE

## 2021-01-21 LAB — TSH: TSH: 1.47 u[IU]/mL (ref 0.450–4.500)

## 2021-01-24 ENCOUNTER — Other Ambulatory Visit: Payer: Self-pay

## 2021-01-25 ENCOUNTER — Other Ambulatory Visit: Payer: Self-pay | Admitting: Gerontology

## 2021-01-25 ENCOUNTER — Other Ambulatory Visit: Payer: Self-pay

## 2021-01-25 ENCOUNTER — Encounter (INDEPENDENT_AMBULATORY_CARE_PROVIDER_SITE_OTHER): Payer: Self-pay | Admitting: Nurse Practitioner

## 2021-01-25 ENCOUNTER — Ambulatory Visit (INDEPENDENT_AMBULATORY_CARE_PROVIDER_SITE_OTHER): Payer: Self-pay | Admitting: Nurse Practitioner

## 2021-01-25 ENCOUNTER — Institutional Professional Consult (permissible substitution): Payer: Self-pay | Admitting: Licensed Clinical Social Worker

## 2021-01-25 VITALS — BP 108/76 | HR 57 | Resp 19 | Ht 63.0 in | Wt 127.0 lb

## 2021-01-25 DIAGNOSIS — M25572 Pain in left ankle and joints of left foot: Secondary | ICD-10-CM

## 2021-01-25 DIAGNOSIS — Z8659 Personal history of other mental and behavioral disorders: Secondary | ICD-10-CM

## 2021-01-25 DIAGNOSIS — M79604 Pain in right leg: Secondary | ICD-10-CM

## 2021-01-25 DIAGNOSIS — M25472 Effusion, left ankle: Secondary | ICD-10-CM

## 2021-01-25 DIAGNOSIS — M79605 Pain in left leg: Secondary | ICD-10-CM

## 2021-01-25 MED ORDER — ESCITALOPRAM OXALATE 20 MG PO TABS
20.0000 mg | ORAL_TABLET | Freq: Every day | ORAL | 0 refills | Status: DC
  Filled 2021-01-25: qty 30, 30d supply, fill #0

## 2021-01-25 NOTE — Progress Notes (Signed)
Subjective:    Patient ID: Natasha Hanson, female    DOB: 1966-05-18, 55 y.o.   MRN: [id number] Chief Complaint  Patient presents with  . New Patient (Initial Visit)    Left foot swelling    Natasha Hanson is a 55 year old female that presents today for evaluation of left lower extremity edema.  The patient notes that this has been ongoing for approximately the last month.  The patient notes that the swelling gets somewhat better after she goes to bed during the day but after she wakes up and goes about her day the swelling grows worse and the cycle repeats itself daily.  She also endorses having some claudication-like symptoms.  She also notes that her bilateral feet are fairly numb as well.  The patient is a current smoker and has a family history of coronary artery disease.  The patient has tried compression but they were very painful for her and felt like they were cutting off her circulation.  She denies rest pain like symptoms.  She denies any open wounds or ulcerations.  She denies any injuries or surgeries to the area.   Review of Systems  Cardiovascular:  Positive for leg swelling.       Claudication  Neurological:  Positive for numbness.  All other systems reviewed and are negative.     Objective:   Physical Exam Vitals reviewed.  HENT:     Head: Normocephalic.  Cardiovascular:     Rate and Rhythm: Normal rate.     Comments: Bilateral pedal pulses nonpalpable Pulmonary:     Effort: Pulmonary effort is normal.  Musculoskeletal:     Left lower leg: Edema present.  Skin:    General: Skin is cool and dry.  Neurological:     Mental Status: She is alert and oriented to person, place, and time.  Psychiatric:        Mood and Affect: Mood normal.        Behavior: Behavior normal.        Thought Content: Thought content normal.        Judgment: Judgment normal.    BP 108/76 (BP Location: Left Arm)   Pulse (!) 57   Resp 19   Ht 5\' 3"  (1.6 m)   Wt 127 lb (57.6 kg)   BMI  22.50 kg/m   Past Medical History:  Diagnosis Date  . Allergy   . Arthritis   . COPD (chronic obstructive pulmonary disease) (HCC)   . Glaucoma   . Tremors of nervous system    neck    Social History   Socioeconomic History  . Marital status: Married    Spouse name: Not on file  . Number of children: Not on file  . Years of education: Not on file  . Highest education level: Not on file  Occupational History  . Not on file  Tobacco Use  . Smoking status: Every Day    Packs/day: 1.00    Years: 40.00    Pack years: 40.00    Types: Cigarettes  . Smokeless tobacco: Never  Vaping Use  . Vaping Use: Never used  Substance and Sexual Activity  . Alcohol use: Yes    Comment: drinks occasionally  . Drug use: Never  . Sexual activity: Not on file  Other Topics Concern  . Not on file  Social History Narrative  . Not on file   Social Determinants of Health   Financial Resource Strain: High Risk  . Difficulty  of Paying Living Expenses: Very hard  Food Insecurity: No Food Insecurity  . Worried About Programme researcher, broadcasting/film/video in the Last Year: Never true  . Ran Out of Food in the Last Year: Never true  Transportation Needs: Unmet Transportation Needs  . Lack of Transportation (Medical): No  . Lack of Transportation (Non-Medical): Yes  Physical Activity: Inactive  . Days of Exercise per Week: 0 days  . Minutes of Exercise per Session: 0 min  Stress: Stress Concern Present  . Feeling of Stress : Very much  Social Connections: Socially Isolated  . Frequency of Communication with Friends and Family: Once a week  . Frequency of Social Gatherings with Friends and Family: Once a week  . Attends Religious Services: Never  . Active Member of Clubs or Organizations: No  . Attends Banker Meetings: Never  . Marital Status: Widowed  Intimate Partner Violence: Not on file    Past Surgical History:  Procedure Laterality Date  . ABDOMINAL HYSTERECTOMY    . FOOT FUSION     . FOOT SURGERY    . REFRACTIVE SURGERY      Family History  Problem Relation Age of Onset  . Other Mother        unkown COD  . Anxiety disorder Mother   . Pneumonia Father   . Atrial fibrillation Daughter   . Other Maternal Grandmother        unkown medical history  . Other Maternal Grandfather        unkown medical history  . Other Paternal Grandmother        unkown medical history  . Other Paternal Grandfather        unkown medical history    Allergies  Allergen Reactions  . No Known Allergies     CBC Latest Ref Rng & Units 12/30/2019 02/15/2019 01/05/2018  WBC 4.0 - 10.5 K/uL 11.5(H) 9.8 12.7(H)  Hemoglobin 12.0 - 15.0 g/dL 52.7 15.5(H) 15.2  Hematocrit 36.0 - 46.0 % 42.4 45.7 43.5  Platelets 150 - 400 K/uL 380 372 326      CMP     Component Value Date/Time   NA 141 01/19/2021 1153   NA 138 09/30/2014 1350   K 4.8 01/19/2021 1153   K 3.7 09/30/2014 1350   CL 109 (H) 01/19/2021 1153   CL 104 09/30/2014 1350   CO2 19 (L) 01/19/2021 1153   CO2 26 09/30/2014 1350   GLUCOSE 75 01/19/2021 1153   GLUCOSE 93 12/30/2019 1201   GLUCOSE 128 (H) 09/30/2014 1350   BUN 10 01/19/2021 1153   BUN 9 09/30/2014 1350   CREATININE 0.83 01/19/2021 1153   CREATININE 0.75 09/30/2014 1350   CALCIUM 9.3 01/19/2021 1153   CALCIUM 9.2 09/30/2014 1350   PROT 6.3 01/19/2021 1153   ALBUMIN 3.9 01/19/2021 1153   AST 13 01/19/2021 1153   ALT 6 01/19/2021 1153   ALKPHOS 69 01/19/2021 1153   BILITOT 0.4 01/19/2021 1153   GFRNONAA >60 12/30/2019 1201   GFRNONAA >60 09/30/2014 1350   GFRAA >60 12/30/2019 1201   GFRAA >60 09/30/2014 1350     No results found.     Assessment & Plan:   1. Pain and swelling of ankle, left The patient does have some varicosities noted bilaterally so it is certainly possible that the swelling is related to some venous insufficiency.  We discussed the use of medical grade 1 compression socks, elevation and activity for control lower extremity edema.   We  also discussed systemic causes of edema however the patient notes that her cardiac, renal and hepatic systems are all normal to her knowledge.  We will have the patient return at her convenience for noninvasive studies.  2. Pain in both lower extremities Based upon the physical exam as well as the patient's description of symptoms there is some concern for peripheral arterial disease.  I have advised the patient to hold off on purchasing compression until we evaluate her noninvasive studies due to concern for severe peripheral arterial disease.  We will check ABIs when the patient returns to evaluate her arterial circulation in regards to her current symptoms.   Current Outpatient Medications on File Prior to Visit  Medication Sig Dispense Refill  . amoxicillin-clavulanate (AUGMENTIN) 875-125 MG tablet Take 1 tablet by mouth 2 (two) times daily. 10 tablet 0  . baclofen (LIORESAL) 10 MG tablet Take (1/2) tablet (5mg  total) by mouth twice daily. 90 tablet 0  . cetirizine (ZYRTEC) 10 MG tablet Take 1 tablet (10 mg total) by mouth once daily. 30 tablet 3  . gabapentin (NEURONTIN) 600 MG tablet TAKE ONE TABLET (600MG  TOTAL) BY MOUTH FOUR TIMES DAILY. (IN THE MORNING, NOON, EVENING, AND AT BEDTIME). 120 tablet 2  . oxyCODONE (OXY IR/ROXICODONE) 5 MG immediate release tablet Take 1 tablet (5 mg total) by mouth every 8 (eight) hours as needed for severe pain. Must last 30 days. 90 tablet 0  . PROAIR HFA 108 (90 Base) MCG/ACT inhaler INHALE 2 TO 4 PUFFS BY MOUTH EVERY 4 HOURS AS NEEDED (Patient taking differently: Needing two-three times daily during summer months) 51 g 99  . propranolol (INDERAL) 20 MG tablet Take 1 tablet (20 mg total) by mouth Two (2) times a day. 60 tablet 3  . topiramate (TOPAMAX) 50 MG tablet Take 1 tablet (50 mg) in the morning, half a tablet (25 mg) in the afternoon and 1 tablet (50 mg) at night for 2 weeks. Then continue taking 1 tablet (50 mg) three times a day. (Patient taking  differently: Patient is taking 50 mg three times a day) 90 tablet 2  . triamcinolone cream (KENALOG) 0.1 % Apply 1 application topically 2 (two) times daily. 30 g 0  . Vitamin D, Cholecalciferol, 25 MCG (1000 UT) TABS Take 1,000 Units by mouth daily.    oxyCODONE (OXY IR/ROXICODONE) 5 MG immediate release tablet Take 1 tablet (5 mg total) by mouth every 8 (eight) hours as needed for severe pain. Must last 30 days. 90 tablet 0  . oxyCODONE (OXY IR/ROXICODONE) 5 MG immediate release tablet Take 1 tablet (5 mg total) by mouth every 8 (eight) hours as needed for severe pain. Must last 30 days. 90 tablet 0  . sucralfate (CARAFATE) 1 g tablet Take 1 tablet (1 g total) by mouth 4 (four) times daily -  with meals and at bedtime. 120 tablet 0  . Turmeric 500 MG CAPS Take 500 mg by mouth daily. 30 capsule 2   No current facility-administered medications on file prior to visit.    There are no Patient Instructions on file for this visit. No follow-ups on file.   , NP

## 2021-01-26 NOTE — Telephone Encounter (Signed)
Social worker called patient and provided her with phone numbers for [facility] in reference to her request for dentures.

## 2021-01-27 ENCOUNTER — Other Ambulatory Visit: Payer: Self-pay

## 2021-01-27 ENCOUNTER — Ambulatory Visit: Payer: Self-pay | Admitting: Licensed Clinical Social Worker

## 2021-01-27 DIAGNOSIS — F331 Major depressive disorder, recurrent, moderate: Secondary | ICD-10-CM

## 2021-01-27 DIAGNOSIS — F411 Generalized anxiety disorder: Secondary | ICD-10-CM

## 2021-01-27 NOTE — Telephone Encounter (Signed)
Patient called clinic to check on her appointment type for today and social worker was able to discuss resources with patient and will mail resources to patient today.
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
## 2021-01-28 ENCOUNTER — Other Ambulatory Visit: Payer: Self-pay

## 2021-01-28 NOTE — Progress Notes (Signed)
Patient is in need of various resources such as home repair, (her roof and bathtub about to go through the floor),  electric services,  and dentures.  Patient informed social worker that she had been getting in touch with churches to see if they could repair her roof and social worker encouraged her to continue to do so for home repair.  Social worker mentioned a few churches that patient may want to check into. Patient is in need of dentures and social worker informed patient that she would check on a resource and get back with her. 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 Social worker will look into additional resources for [facility] assistance.

## 2021-01-31 ENCOUNTER — Other Ambulatory Visit: Payer: Self-pay

## 2021-02-01 ENCOUNTER — Other Ambulatory Visit: Payer: Self-pay

## 2021-02-01 MED ORDER — BACLOFEN 10 MG PO TABS
10.0000 mg | ORAL_TABLET | ORAL | 0 refills | Status: DC
  Filled 2021-02-01: qty 30, 30d supply, fill #0
  Filled 2021-03-08: qty 30, 30d supply, fill #1
  Filled 2021-04-04: qty 30, 30d supply, fill #2

## 2021-02-02 ENCOUNTER — Encounter: Payer: Self-pay | Admitting: Gerontology

## 2021-02-02 ENCOUNTER — Telehealth: Payer: Self-pay | Admitting: Gerontology

## 2021-02-02 ENCOUNTER — Ambulatory Visit: Payer: Self-pay | Admitting: Gerontology

## 2021-02-02 ENCOUNTER — Other Ambulatory Visit: Payer: Self-pay

## 2021-02-02 ENCOUNTER — Encounter: Payer: Self-pay | Admitting: Licensed Clinical Social Worker

## 2021-02-02 VITALS — BP 104/65 | HR 52 | Temp 98.2°F | Resp 16 | Ht 63.0 in | Wt 125.5 lb

## 2021-02-02 DIAGNOSIS — Z8659 Personal history of other mental and behavioral disorders: Secondary | ICD-10-CM

## 2021-02-02 DIAGNOSIS — H938X2 Other specified disorders of left ear: Secondary | ICD-10-CM

## 2021-02-02 DIAGNOSIS — Z8739 Personal history of other diseases of the musculoskeletal system and connective tissue: Secondary | ICD-10-CM | POA: Insufficient documentation

## 2021-02-02 HISTORY — DX: Personal history of other diseases of the musculoskeletal system and connective tissue: Z87.39

## 2021-02-02 HISTORY — DX: Other specified disorders of left ear: H93.8X2

## 2021-02-02 MED ORDER — BACLOFEN 10 MG PO TABS
10.0000 mg | ORAL_TABLET | ORAL | 0 refills | Status: DC
Start: 2021-02-02 — End: 2021-02-09
  Filled 2021-02-02: qty 60, fill #0

## 2021-02-02 MED ORDER — DULOXETINE HCL 60 MG PO CPEP
60.0000 mg | ORAL_CAPSULE | Freq: Every day | ORAL | 0 refills | Status: DC
  Filled 2021-02-02: qty 30, 30d supply, fill #0

## 2021-02-02 NOTE — Progress Notes (Signed)
Established Patient Office Visit  Subjective:  Patient ID: Natasha Hanson, female    DOB: 09-28-1965  Age: 55 y.o. MRN: [id number]  CC:  Chief Complaint  Patient presents with   Follow-up    Patient here for follow up of ear pain. Patient states she thinks the other ear is infected now.    HPI Natasha Hanson  is a 55 y/o female who has history of Allergy, Arthritis, COPD, Tremors of nervous system neck presents for c/o fullness sensation to left ear that started 4 days ago. She was treated for Acute Otitis externa to her right ear with Augmentin for 5 days on 01/19/21. Currently, she states that symptoms has resolved in her right ear, but she's experiencing sensation of fullness to her left ear. She denies dizziness, tinnitus and vertigo. She continues to experience edema to her left lower leg. She will follow up with Vascular Surgery on 02/09/21 for ABI . Her BNP done on 01/19/21 was 105.6 pg/ml, she denies chest pain, palpitation, and light headedness. She has a history of anxiety and depression, reports that she doesn't take Lexapro.  She also reports having history of back spasm and takes 5 mg baclofen bid. She states that her mood is good, denies suicidal nor homicidal ideation.  Overall, she states that she's doing well and offers no further symptoms.  Past Medical History:  Diagnosis Date   Allergy    Arthritis    COPD (chronic obstructive pulmonary disease) (HCC)    Glaucoma    Tremors of nervous system    neck    Past Surgical History:  Procedure Laterality Date   ABDOMINAL HYSTERECTOMY     FOOT FUSION     FOOT SURGERY     REFRACTIVE SURGERY      Family History  Problem Relation Age of Onset   Other Mother        unkown COD   Anxiety disorder Mother    Pneumonia Father    Atrial fibrillation Daughter    Other Maternal Grandmother        unkown medical history   Other Maternal Grandfather        unkown medical history   Other Paternal Grandmother        unkown  medical history   Other Paternal Grandfather        unkown medical history    Social History   Socioeconomic History   Marital status: Married    Spouse name: Not on file   Number of children: Not on file   Years of education: Not on file   Highest education level: Not on file  Occupational History   Not on file  Tobacco Use   Smoking status: Every Day    Packs/day: 1.00    Years: 40.00    Pack years: 40.00    Types: Cigarettes   Smokeless tobacco: Never  Vaping Use   Vaping Use: Never used  Substance and Sexual Activity   Alcohol use: Yes    Comment: drinks occasionally   Drug use: Never   Sexual activity: Not on file  Other Topics Concern   Not on file  Social History Narrative   Not on file   Social Determinants of Health   Financial Resource Strain: High Risk   Difficulty of Paying Living Expenses: Very hard  Food Insecurity: No Food Insecurity   Worried About Running Out of Food in the Last Year: Never true   Ran Out of Food in  the Last Year: Never true  Transportation Needs: Public librarian (Medical): No   Lack of Transportation (Non-Medical): Yes  Physical Activity: Inactive   Days of Exercise per Week: 0 days   Minutes of Exercise per Session: 0 min  Stress: Stress Concern Present   Feeling of Stress : Very much  Social Connections: Socially Isolated   Frequency of Communication with Friends and Family: Once a week   Frequency of Social Gatherings with Friends and Family: Once a week   Attends Religious Services: Never   Marine scientist or Organizations: No   Attends Archivist Meetings: Never   Marital Status: Widowed  Intimate Partner Violence: Not on file    Outpatient Medications Prior to Visit  Medication Sig Dispense Refill   baclofen (LIORESAL) 10 MG tablet Take (1/2) tablet (91m total) by mouth twice daily. 90 tablet 0   cetirizine (ZYRTEC) 10 MG tablet Take 1 tablet (10 mg total) by  mouth once daily. 30 tablet 3   gabapentin (NEURONTIN) 600 MG tablet TAKE ONE TABLET (600MG TOTAL) BY MOUTH FOUR TIMES DAILY. (IN THE MORNING, NOON, EVENING, AND AT BEDTIME). 120 tablet 2   oxyCODONE (OXY IR/ROXICODONE) 5 MG immediate release tablet Take 1 tablet (5 mg total) by mouth every 8 (eight) hours as needed for severe pain. Must last 30 days. 90 tablet 0   PROAIR HFA 108 (90 Base) MCG/ACT inhaler INHALE 2 TO 4 PUFFS BY MOUTH EVERY 4 HOURS AS NEEDED (Patient taking differently: Needing two-three times daily during summer months) 51 g 99   propranolol (INDERAL) 20 MG tablet Take 1 tablet (20 mg total) by mouth Two (2) times a day. 60 tablet 3   topiramate (TOPAMAX) 50 MG tablet Take 1 tablet (50 mg) in the morning, half a tablet (25 mg) in the afternoon and 1 tablet (50 mg) at night for 2 weeks. Then continue taking 1 tablet (50 mg) three times a day. (Patient taking differently: Patient is taking 50 mg three times a day) 90 tablet 2   triamcinolone cream (KENALOG) 0.1 % Apply 1 application topically 2 (two) times daily. 30 g 0   Vitamin D, Cholecalciferol, 25 MCG (1000 UT) TABS Take 1,000 Units by mouth daily.     oxyCODONE (OXY IR/ROXICODONE) 5 MG immediate release tablet Take 1 tablet (5 mg total) by mouth every 8 (eight) hours as needed for severe pain. Must last 30 days. 90 tablet 0   oxyCODONE (OXY IR/ROXICODONE) 5 MG immediate release tablet Take 1 tablet (5 mg total) by mouth every 8 (eight) hours as needed for severe pain. Must last 30 days. 90 tablet 0   amoxicillin-clavulanate (AUGMENTIN) 875-125 MG tablet Take 1 tablet by mouth 2 (two) times daily. 10 tablet 0   escitalopram (LEXAPRO) 20 MG tablet Take 1 tablet (20 mg total) by mouth once daily. (Patient not taking: Reported on 02/02/2021) 30 tablet 0   sucralfate (CARAFATE) 1 g tablet Take 1 tablet (1 g total) by mouth 4 (four) times daily -  with meals and at bedtime. 120 tablet 0   Turmeric 500 MG CAPS Take 500 mg by mouth daily. 30  capsule 2   No facility-administered medications prior to visit.    Allergies  Allergen Reactions   No Known Allergies     ROS Review of Systems  Constitutional: Negative.   HENT:  Positive for ear pain (left ear).   Respiratory: Negative.    Cardiovascular: Negative.  Musculoskeletal:  Positive for myalgias (chronic intermittent muscle sapasm to back).  Neurological: Negative.   Psychiatric/Behavioral: Negative.       Objective:    Physical Exam HENT:     Left Ear: Hearing normal.  No middle ear effusion. There is no impacted cerumen. No mastoid tenderness. Tympanic membrane is not erythematous.  Cardiovascular:     Rate and Rhythm: Normal rate and regular rhythm.     Pulses: Normal pulses.     Heart sounds: Normal heart sounds.  Pulmonary:     Effort: Pulmonary effort is normal.     Breath sounds: Normal breath sounds.  Neurological:     General: No focal deficit present.     Mental Status: She is alert and oriented to person, place, and time. Mental status is at baseline.  Psychiatric:        Mood and Affect: Mood normal.        Behavior: Behavior normal.        Thought Content: Thought content normal.        Judgment: Judgment normal.    BP 104/65 (BP Location: Right Arm, Patient Position: Sitting, Cuff Size: Normal)   Pulse (!) 52   Temp 98.2 F (36.8 C)   Resp 16   Ht 5' 3"  (1.6 m)   Wt 125 lb 8 oz (56.9 kg)   SpO2 98%   BMI 22.23 kg/m  Wt Readings from Last 3 Encounters:  02/02/21 125 lb 8 oz (56.9 kg)  01/25/21 127 lb (57.6 kg)  01/19/21 127 lb 12.8 oz (58 kg)     Health Maintenance Due  Topic Date Due   COVID-19 Vaccine (1) Never done   Pneumococcal Vaccine 1-26 Years old (1 - PCV) Never done   HIV Screening  Never done   Hepatitis C Screening  Never done   Zoster Vaccines- Shingrix (1 of 2) Never done   PAP SMEAR-Modifier  Never done   COLONOSCOPY (Pts 45-40yr Insurance coverage will need to be confirmed)  Never done   MAMMOGRAM  Never  done   INFLUENZA VACCINE  01/10/2021    There are no preventive care reminders to display for this patient.  Lab Results  Component Value Date   TSH 1.470 01/19/2021   Lab Results  Component Value Date   WBC 11.5 (H) 12/30/2019   HGB 14.7 12/30/2019   HCT 42.4 12/30/2019   MCV 98.4 12/30/2019   PLT 380 12/30/2019   Lab Results  Component Value Date   NA 141 01/19/2021   K 4.8 01/19/2021   CO2 19 (L) 01/19/2021   GLUCOSE 75 01/19/2021   BUN 10 01/19/2021   CREATININE 0.83 01/19/2021   BILITOT 0.4 01/19/2021   ALKPHOS 69 01/19/2021   AST 13 01/19/2021   ALT 6 01/19/2021   PROT 6.3 01/19/2021   ALBUMIN 3.9 01/19/2021   CALCIUM 9.3 01/19/2021   ANIONGAP 8 12/30/2019   EGFR 83 01/19/2021   No results found for: CHOL No results found for: HDL No results found for: LDLCALC No results found for: TRIG No results found for: CHOLHDL No results found for: HGBA1C    Assessment & Plan:   1. History of muscle spasm -She will continue on baclofen for muscle spasms to her back.  She was educated on medication side effects and advised to notify clinic. - baclofen (LIORESAL) 10 MG tablet; Take (1/2) tablet (568mtotal) by mouth twice daily.  Dispense: 60 tablet; Refill: 0  2. Sensation of fullness in  ear, left -She has no immediate infusion, tympanic membrane was clear, all landmarks were visualized, no mastoid pain.  She was advised to notify clinic for worsening symptoms.  3. History of depression -Grandkids consultation with Dr. Clovis Riley, she will continue on duloxetine 60 mg daily.  She was educated on medication side effect and advised to notify clinic or go to the emergency room. - DULoxetine (CYMBALTA) 60 MG capsule; Take 1 capsule (60 mg total) by mouth daily.  Dispense: 30 capsule; Refill: 0     Follow-up: Return in about 2 weeks (around 02/16/2021), or if symptoms worsen or fail to improve.    Asyria Kolander Jerold Coombe, NP

## 2021-02-02 NOTE — Telephone Encounter (Signed)
ERROR

## 2021-02-03 ENCOUNTER — Other Ambulatory Visit: Payer: Self-pay

## 2021-02-07 NOTE — Progress Notes (Signed)
PROVIDER NOTE: Information contained herein reflects review and annotations entered in association with encounter. Interpretation of such information and data should be left to medically-trained personnel. Information provided to patient can be located elsewhere in the medical record under "Patient Instructions". Document created using STT-dictation technology, any transcriptional errors that may result from process are unintentional.    Patient: Natasha Hanson  Service Category: E/M  Provider: Gaspar Cola, MD  DOB: 08-23-1965  DOS: 02/09/2021  Specialty: Interventional Pain Management  MRN: [id number]  Setting: Ambulatory outpatient  PCP: Langston Reusing, NP  Type: Established Patient    Referring Provider: No ref. provider found  Location: Office  Delivery: Face-to-face     HPI  [name], a 55 y.o. year old female, is here today because of her Chronic pain syndrome [G89.4]. Ms. Broy primary complain today is Back Pain (Upper and lower back bilateral ) and Knee Pain (Bilateral ) Last encounter: My last encounter with her was on 11/16/2020. Pertinent problems: Ms. Carnegie has Chronic pain syndrome; Cervicalgia; DDD (degenerative disc disease), cervical; Cervical facet hypertrophy; Cervical facet syndrome; Cervical central spinal stenosis; Abnormal MRI, cervical spine (02/20/2019); Cervical foraminal stenosis; Chronic low back pain (1ry area of Pain) (Bilateral) (Midline) (L>R) w/o sciatica; Chronic knee pain (2ry area of Pain) (Bilateral) (L>R); Chronic neck pain (3ry area of Pain) (Bilateral) (L>R); DDD (degenerative disc disease), lumbosacral; Lumbar facet hypertrophy (Multilevel) (Bilateral); Lumbar facet syndrome (Bilateral); Osteoarthritis of knee (Left); Osteoarthritis of knee (Right); Osteoarthritis of the knees (Bilateral); Chronic sacroiliac joint pain (Bilateral); Somatic dysfunction of sacroiliac joints (Bilateral); Other spondylosis, sacral and sacrococcygeal region;  Spondylosis without myelopathy or radiculopathy, lumbosacral region; Chronic hip pain (Bilateral); Neurogenic pain; Chronic musculoskeletal pain; Arm numbness; Other intervertebral disc degeneration, lumbar region; Arthritis of knee; Muscle cramps; Chronic knee pain (Left); Chronic upper back pain; and Chronic midline thoracic back pain on their pertinent problem list. Pain Assessment: Severity of Chronic pain is reported as a 9 /10. Location: Back (bilateral knees) Upper, Lower, Left, Right/denies. Onset: More than a month ago. Quality: Discomfort, Constant, Sharp. Timing: Constant. Modifying factor(s): nothing currently.  medications are working okay. Vitals:  height is _0  (1.6 m) and weight is 129 lb (58.5 kg). Her temporal temperature is 97.5 F (36.4 C) (abnormal). Her blood pressure is 104/59 (abnormal) and her pulse is 46 (abnormal). Her respiration is 16 and oxygen saturation is 100%.   Reason for encounter: medication management.   The patient indicates doing well with the current medication regimen. No adverse reactions or side effects reported to the medications.   RTCB: 05/14/2021 Nonopioids transferred 05/10/2020: Magnesium, Robaxin, Neurontin, turmeric, and Mobic.  Pharmacotherapy Assessment  Analgesic: Oxycodone IR 5 mg every 8 hours (15 mg/day of oxycodone) Highest recorded MME/day: 30 mg/day MME/day: 22.5 mg/day   Monitoring: [place] PMP: PDMP reviewed during this encounter.       Pharmacotherapy: No side-effects or adverse reactions reported. Compliance: No problems identified. Effectiveness: Clinically acceptable.  Janett Billow, RN  02/09/2021  8:23 AM  Sign when Signing Visit Nursing Pain Medication Assessment:  Safety precautions to be maintained throughout the outpatient stay will include: orient to surroundings, keep bed in low position, maintain call bell within reach at all times, provide assistance with transfer out of bed and ambulation.  Medication Inspection  Compliance: Pill count conducted under aseptic conditions, in front of the patient. Neither the pills nor the bottle was removed from the patient's sight at any time. Once count was completed  pills were immediately returned to the patient in their original bottle.  Medication: Oxycodone IR Pill/Patch Count:  20 of 90 pills remain Pill/Patch Appearance: Markings consistent with prescribed medication Bottle Appearance: Standard pharmacy container. Clearly labeled. Filled Date: 08 / 06 / 2022 Last Medication intake:  Today    UDS:  Summary  Date Value Ref Range Status  11/15/2020 Note  Final    Comment:    ==================================================================== [employer] [facility] (MW) ==================================================================== Test                             Result       Flag       Units  Drug Present and Declared for Prescription Verification   Oxymorphone                    199          EXPECTED   ng/mg creat   Noroxycodone                   133          EXPECTED   ng/mg creat    Oxymorphone and noroxycodone are expected metabolites of oxycodone.    Sources of oxycodone are scheduled prescription medications.    Oxymorphone is also available as a scheduled prescription medication.  Drug Absent but Declared for Prescription Verification   Oxycodone                      Not Detected UNEXPECTED ng/mg creat    Oxycodone is almost always present in patients taking this drug    consistently.  Absence of oxycodone could be due to lapse of time    since the last dose or unusual pharmacokinetics (rapid metabolism).  ==================================================================== Test                      Result    Flag   Units      Ref Range   Creatinine              114              mg/dL      >=20 ==================================================================== Declared Medications:  The flagging and interpretation on this report are based  on the  following declared medications.  Unexpected results may arise from  inaccuracies in the declared medications.   **Note: The testing scope of this panel includes these medications:   Oxycodone   **Note: The testing scope of this panel does not include the  following reported medications:   Albuterol (Proair HFA)  Baclofen (Lioresal)  Escitalopram (Lexapro)  Gabapentin (Neurontin)  Magnesium (Mag-Ox)  Methocarbamol (Robaxin)  Propranolol (Inderal)  Sucralfate (Carafate)  Topiramate (Topamax)  Turmeric ==================================================================== For clinical consultation, please call [phone]. ====================================================================      ROS  Constitutional: Denies any fever or chills Gastrointestinal: No reported hemesis, hematochezia, vomiting, or acute GI distress Musculoskeletal: Denies any acute onset joint swelling, redness, loss of ROM, or weakness Neurological: No reported episodes of acute onset apraxia, aphasia, dysarthria, agnosia, amnesia, paralysis, loss of coordination, or loss of consciousness  Medication Review  DULoxetine, Vitamin D (Cholecalciferol), albuterol, baclofen, cetirizine, gabapentin, oxyCODONE, propranolol, topiramate, and triamcinolone cream  History Review  Allergy: Ms. Hojnacki is allergic to no known allergies. Drug: Ms. Moustafa  reports no history of drug use. Alcohol:  reports current alcohol use. Tobacco:  reports  that she has been smoking cigarettes. She has a 40.00 pack-year smoking history. She has never used smokeless tobacco. Social: Ms. Cozart  reports that she has been smoking cigarettes. She has a 40.00 pack-year smoking history. She has never used smokeless tobacco. She reports current alcohol use. She reports that she does not use drugs. Medical:  has a past medical history of Allergy, Arthritis, COPD (chronic obstructive pulmonary disease) ([place]), Glaucoma, and Tremors of  nervous system. Surgical: Ms. Sui  has a past surgical history that includes Refractive surgery; Abdominal hysterectomy; Foot surgery; and Foot Fusion. Family: family history includes Anxiety disorder in her mother; Atrial fibrillation in her daughter; Other in her maternal grandfather, maternal grandmother, mother, paternal grandfather, and paternal grandmother; Pneumonia in her father.  Laboratory Chemistry Profile   Renal Lab Results  Component Value Date   BUN 10 01/19/2021   CREATININE 0.83 01/19/2021   BCR 12 01/19/2021   GFRAA >60 12/30/2019   GFRNONAA >60 12/30/2019    Hepatic Lab Results  Component Value Date   AST 13 01/19/2021   ALT 6 01/19/2021   ALBUMIN 3.9 01/19/2021   ALKPHOS 69 01/19/2021   LIPASE 20 12/30/2019    Electrolytes Lab Results  Component Value Date   NA 141 01/19/2021   K 4.8 01/19/2021   CL 109 (H) 01/19/2021   CALCIUM 9.3 01/19/2021   MG 1.8 09/09/2019    Bone Lab Results  Component Value Date   25OHVITD1 33 09/09/2019   25OHVITD2 <1.0 09/09/2019   25OHVITD3 33 09/09/2019    Inflammation (CRP: Acute Phase) (ESR: Chronic Phase) Lab Results  Component Value Date   CRP 2 09/09/2019   ESRSEDRATE 7 09/09/2019         Note: Above Lab results reviewed.  Recent Imaging Review  US Venous Img Lower Unilateral Left (DVT) CLINICAL DATA:  Left lower extremity pain and edema.  EXAM: LEFT LOWER EXTREMITY VENOUS DOPPLER ULTRASOUND  TECHNIQUE: Gray-scale sonography with graded compression, as well as color Doppler and duplex ultrasound were performed to evaluate the lower extremity deep venous systems from the level of the common femoral vein and including the common femoral, femoral, profunda femoral, popliteal and calf veins including the posterior tibial, peroneal and gastrocnemius veins when visible. The superficial great saphenous vein was also interrogated. Spectral Doppler was utilized to evaluate flow at rest and with distal  augmentation maneuvers in the common femoral, femoral and popliteal veins.  COMPARISON:  None.  FINDINGS: Contralateral Common Femoral Vein: Respiratory phasicity is normal and symmetric with the symptomatic side. No evidence of thrombus. Normal compressibility.  Common Femoral Vein: No evidence of thrombus. Normal compressibility, respiratory phasicity and response to augmentation.  Saphenofemoral Junction: No evidence of thrombus. Normal compressibility and flow on color Doppler imaging.  Profunda Femoral Vein: No evidence of thrombus. Normal compressibility and flow on color Doppler imaging.  Femoral Vein: No evidence of thrombus. Normal compressibility, respiratory phasicity and response to augmentation.  Popliteal Vein: No evidence of thrombus. Normal compressibility, respiratory phasicity and response to augmentation.  Calf Veins: No evidence of thrombus. Normal compressibility and flow on color Doppler imaging.  Superficial Great Saphenous Vein: No evidence of thrombus. Normal compressibility.  Venous Reflux:  None.  Other Findings: No evidence of superficial thrombophlebitis or abnormal fluid collection.  IMPRESSION: No evidence of left lower extremity deep venous thrombosis.  Electronically Signed   By: Aletta Edouard M.D.   On: 01/14/2021 12:53 Note: Reviewed        Physical  Exam  General appearance: Well nourished, well developed, and well hydrated. In no apparent acute distress Mental status: Alert, oriented x 3 (person, place, & time)       Respiratory: No evidence of acute respiratory distress Eyes: PERLA Vitals: BP (!) 104/59 (BP Location: Right Arm, Patient Position: Sitting, Cuff Size: Normal)   Pulse (!) 46   Temp (!) 97.5 F (36.4 C) (Temporal)   Resp 16   Ht _0  (1.6 m)   Wt 129 lb (58.5 kg)   SpO2 100%   BMI 22.85 kg/m  BMI: Estimated body mass index is 22.85 kg/m as calculated from the following:   Height as of this encounter: 5'  3" (1.6 m).   Weight as of this encounter: 129 lb (58.5 kg). Ideal: Ideal body weight: 52.4 kg (115 lb 8.3 oz) Adjusted ideal body weight: 54.8 kg (120 lb 14.6 oz)  Assessment   Status Diagnosis  Controlled Controlled Controlled 1. Chronic pain syndrome   2. Chronic low back pain (1ry area of Pain) (Bilateral) (Midline) (L>R) w/o sciatica   3. Chronic knee pain (2ry area of Pain) (Bilateral) (L>R)   4. Chronic neck pain (3ry area of Pain) (Bilateral) (L>R)   5. Pharmacologic therapy   6. Chronic use of opiate for therapeutic purpose   7. Encounter for medication management   8. Chronic upper back pain   9. Chronic midline thoracic back pain   10. Lumbar facet syndrome (Bilateral)      Updated Problems: Problem  Chronic Upper Back Pain  Chronic Midline Thoracic Back Pain    Plan of Care  Problem-specific:  No problem-specific Assessment & Plan notes found for this encounter.  Ms. IMO CUMBIE has a current medication list which includes the following long-term medication(s): cetirizine, duloxetine, gabapentin, [START ON 02/13/2021] oxycodone, [START ON 03/15/2021] oxycodone, [START ON 04/14/2021] oxycodone, proair hfa, propranolol, and topiramate.  Pharmacotherapy (Medications Ordered): Meds ordered this encounter  Medications   oxyCODONE (OXY IR/ROXICODONE) 5 MG immediate release tablet    Sig: Take 1 tablet (5 mg total) by mouth every 8 (eight) hours as needed for severe pain. Must last 30 days.    Dispense:  90 tablet    Refill:  0    Not a duplicate. Do NOT delete! Dispense 1 day early if closed on refill date. Avoid benzodiazepines within 8 hours of opioids. Do not send refill requests.   oxyCODONE (OXY IR/ROXICODONE) 5 MG immediate release tablet    Sig: Take 1 tablet (5 mg total) by mouth every 8 (eight) hours as needed for severe pain. Must last 30 days.    Dispense:  90 tablet    Refill:  0    Not a duplicate. Do NOT delete! Dispense 1 day early if closed on refill  date. Avoid benzodiazepines within 8 hours of opioids. Do not send refill requests.   oxyCODONE (OXY IR/ROXICODONE) 5 MG immediate release tablet    Sig: Take 1 tablet (5 mg total) by mouth every 8 (eight) hours as needed for severe pain. Must last 30 days.    Dispense:  90 tablet    Refill:  0    Not a duplicate. Do NOT delete! Dispense 1 day early if closed on refill date. Avoid benzodiazepines within 8 hours of opioids. Do not send refill requests.    Orders:  Orders Placed This Encounter  Procedures   LUMBAR FACET(MEDIAL BRANCH NERVE BLOCK) MBNB    Standing Status:   Future  Standing Expiration Date:   03/11/2021    Scheduling Instructions:     Procedure: Lumbar facet block (AKA.: Lumbosacral medial branch nerve block)     Side: Bilateral     Level: L3-4, L4-5, & L5-S1 Facets (L2, L3, L4, L5, & S1 Medial Branch Nerves)     Sedation: Patient's choice.     Timeframe: ASAA    Order Specific Question:   Where will this procedure be performed?    Answer:   [facility]   DG Thoracic Spine 4V    Patient presents with axial pain with possible radicular component. Please assist [place] in identifying specific level(s) and laterality of any additional findings such as: 1. Facet (Zygapophyseal) joint DJD (Hypertrophy, space narrowing, subchondral sclerosis, and/or osteophyte formation) 2. DDD and/or IVDD (Loss of disc height, desiccation, gas patterns, osteophytes, endplate sclerosis, or "Black disc disease") 3. Pars defects 4. Spondylolisthesis, spondylosis, and/or spondyloarthropathies (include Degree/Grade of displacement in mm) (stability) 5. Vertebral body Fractures (acute/chronic) (state percentage of collapse) 6. Demineralization (osteopenia/osteoporotic) 7. Bone pathology 8. Foraminal narrowing  9. Surgical changes    Standing Status:   Future    Standing Expiration Date:   05/11/2021    Order Specific Question:   Reason for Exam (SYMPTOM  OR DIAGNOSIS REQUIRED)    Answer:    Upper back pain and/or thoracic spine pain.    Order Specific Question:   Is patient pregnant?    Answer:   No    Order Specific Question:   Preferred imaging location?    Answer:   [facility]    Order Specific Question:   Call Results- Best Contact Number?    Answer:   [phone] [facility])   Informed Consent Details: Physician/Practitioner Attestation; Transcribe to consent form and obtain patient signature    Nursing Order: Transcribe to consent form and obtain patient signature. Note: Always confirm laterality of pain with Ms. Minogue, before procedure.    Order Specific Question:   Physician/Practitioner attestation of informed consent for procedure/surgical case    Answer:   I, the physician/practitioner, attest that I have discussed with the patient the benefits, risks, side effects, alternatives, likelihood of achieving goals and potential problems during recovery for the procedure that I have provided informed consent.    Order Specific Question:   Procedure    Answer:   Lumbar Facet Block  under fluoroscopic guidance    Order Specific Question:   Physician/Practitioner performing the procedure    Answer:   Trevonn Hallum A. Dossie Arbour MD    Order Specific Question:   Indication/Reason    Answer:   Low Back Pain, with our without leg pain, due to Facet Joint Arthralgia (Joint Pain) Spondylosis (Arthritis of the Spine), without myelopathy or radiculopathy (Nerve Damage).    Follow-up plan:   Return for [facility]) procedure:, (Sed) (B) L-FCT BLK.     Interventional Therapies  Risk  Complexity Considerations:   Estimated body mass index is 23.21 kg/m as calculated from the following:   Height as of this encounter: _0  (1.6 m).   Weight as of this encounter: 131 lb (59.4 kg). WNL   Planned  Pending:      Under consideration:   Diagnostic left lumbar ESI  Possible bilateral lumbar facet RFA  Diagnostic bilateral intra-articular knee joint injection  Possible  bilateral Hyalgan knee injections  Diagnostic bilateral genicular nerve block  Possible bilateral genicular nerve RFA  Diagnostic left cervical ESI  Diagnostic bilateral cervical facet block  Possible bilateral cervical facet RFA    Completed:   Diagnostic bilateral lumbar facet block x2 (08/10/2020)  Diagnostic bilateral IA steroid (Zilretta) knee injection x1 (11/16/2020)    Therapeutic  Palliative (PRN) options:   None established       Recent Visits Date Type Provider Dept  12/22/20 Telemedicine Milinda Pointer, MD [facility]  11/30/20 Telemedicine Milinda Pointer, MD [facility]  11/16/20 Procedure visit Milinda Pointer, MD [facility]  11/15/20 Office Visit Milinda Pointer, MD [facility]  Showing recent visits within past 90 days and meeting all other requirements Today's Visits Date Type Provider Dept  02/09/21 Office Visit Milinda Pointer, MD [facility]  Showing today's visits and meeting all other requirements Future Appointments No visits were found meeting these conditions. Showing future appointments within next 90 days and meeting all other requirements I discussed the assessment and treatment plan with the patient. The patient was provided an opportunity to ask questions and all were answered. The patient agreed with the plan and demonstrated an understanding of the instructions.  Patient advised to call back or seek an in-person evaluation if the symptoms or condition worsens.  Duration of encounter: 30 minutes.  Note by: Gaspar Cola, MD Date: 02/09/2021; Time: 8:47 AM

## 2021-02-08 ENCOUNTER — Other Ambulatory Visit (INDEPENDENT_AMBULATORY_CARE_PROVIDER_SITE_OTHER): Payer: Self-pay | Admitting: Nurse Practitioner

## 2021-02-08 DIAGNOSIS — M25472 Effusion, left ankle: Secondary | ICD-10-CM

## 2021-02-08 DIAGNOSIS — M79604 Pain in right leg: Secondary | ICD-10-CM

## 2021-02-08 DIAGNOSIS — M79605 Pain in left leg: Secondary | ICD-10-CM

## 2021-02-08 DIAGNOSIS — M25572 Pain in left ankle and joints of left foot: Secondary | ICD-10-CM

## 2021-02-09 ENCOUNTER — Ambulatory Visit (INDEPENDENT_AMBULATORY_CARE_PROVIDER_SITE_OTHER): Payer: Self-pay

## 2021-02-09 ENCOUNTER — Other Ambulatory Visit: Payer: Self-pay

## 2021-02-09 ENCOUNTER — Ambulatory Visit (INDEPENDENT_AMBULATORY_CARE_PROVIDER_SITE_OTHER): Payer: Self-pay | Admitting: Nurse Practitioner

## 2021-02-09 ENCOUNTER — Encounter (INDEPENDENT_AMBULATORY_CARE_PROVIDER_SITE_OTHER): Payer: Self-pay | Admitting: Nurse Practitioner

## 2021-02-09 ENCOUNTER — Ambulatory Visit: Payer: Self-pay | Attending: Pain Medicine | Admitting: Pain Medicine

## 2021-02-09 ENCOUNTER — Encounter: Payer: Self-pay | Admitting: Pain Medicine

## 2021-02-09 VITALS — BP 104/59 | HR 46 | Temp 97.5°F | Resp 16 | Ht 63.0 in | Wt 129.0 lb

## 2021-02-09 VITALS — BP 110/74 | HR 46 | Ht 63.0 in | Wt 130.0 lb

## 2021-02-09 DIAGNOSIS — M545 Low back pain, unspecified: Secondary | ICD-10-CM | POA: Insufficient documentation

## 2021-02-09 DIAGNOSIS — M79604 Pain in right leg: Secondary | ICD-10-CM

## 2021-02-09 DIAGNOSIS — M47816 Spondylosis without myelopathy or radiculopathy, lumbar region: Secondary | ICD-10-CM | POA: Insufficient documentation

## 2021-02-09 DIAGNOSIS — M79605 Pain in left leg: Secondary | ICD-10-CM

## 2021-02-09 DIAGNOSIS — G894 Chronic pain syndrome: Secondary | ICD-10-CM | POA: Insufficient documentation

## 2021-02-09 DIAGNOSIS — M25472 Effusion, left ankle: Secondary | ICD-10-CM

## 2021-02-09 DIAGNOSIS — M25572 Pain in left ankle and joints of left foot: Secondary | ICD-10-CM

## 2021-02-09 DIAGNOSIS — M542 Cervicalgia: Secondary | ICD-10-CM | POA: Insufficient documentation

## 2021-02-09 DIAGNOSIS — Z79899 Other long term (current) drug therapy: Secondary | ICD-10-CM | POA: Insufficient documentation

## 2021-02-09 DIAGNOSIS — M546 Pain in thoracic spine: Secondary | ICD-10-CM | POA: Insufficient documentation

## 2021-02-09 DIAGNOSIS — Z79891 Long term (current) use of opiate analgesic: Secondary | ICD-10-CM | POA: Insufficient documentation

## 2021-02-09 DIAGNOSIS — M792 Neuralgia and neuritis, unspecified: Secondary | ICD-10-CM

## 2021-02-09 DIAGNOSIS — M25562 Pain in left knee: Secondary | ICD-10-CM | POA: Insufficient documentation

## 2021-02-09 DIAGNOSIS — M25561 Pain in right knee: Secondary | ICD-10-CM | POA: Insufficient documentation

## 2021-02-09 DIAGNOSIS — G8929 Other chronic pain: Secondary | ICD-10-CM | POA: Insufficient documentation

## 2021-02-09 DIAGNOSIS — M549 Dorsalgia, unspecified: Secondary | ICD-10-CM | POA: Insufficient documentation

## 2021-02-09 DIAGNOSIS — I890 Lymphedema, not elsewhere classified: Secondary | ICD-10-CM

## 2021-02-09 MED ORDER — OXYCODONE HCL 5 MG PO TABS: 5.0000 mg | ORAL_TABLET | Freq: Three times a day (TID) | ORAL | 0 refills | Status: DC | PRN

## 2021-02-09 NOTE — Progress Notes (Signed)
Nursing Pain Medication Assessment:  Safety precautions to be maintained throughout the outpatient stay will include: orient to surroundings, keep bed in low position, maintain call bell within reach at all times, provide assistance with transfer out of bed and ambulation.  Medication Inspection Compliance: Pill count conducted under aseptic conditions, in front of the patient. Neither the pills nor the bottle was removed from the patient's sight at any time. Once count was completed pills were immediately returned to the patient in their original bottle.  Medication: Oxycodone IR Pill/Patch Count:  20 of 90 pills remain Pill/Patch Appearance: Markings consistent with prescribed medication Bottle Appearance: Standard pharmacy container. Clearly labeled. Filled Date: 08 / 06 / 2022 Last Medication intake:  Today

## 2021-02-09 NOTE — Patient Instructions (Addendum)
___[facility] [place] [facility] [place] [facility] mall.  _________________________________________________________________________________________  Medication Rules  Purpose: To inform patients, and their family members, of our rules and regulations.  Applies to: All patients receiving prescriptions (written or electronic).  Pharmacy of record: Pharmacy where electronic prescriptions will be sent. If written prescriptions are taken to a different pharmacy, please inform the nursing staff. The pharmacy listed in the electronic medical record should be the one where you would like electronic prescriptions to be sent.  Electronic prescriptions: In compliance with the [facility] Strengthen Opioid Misuse Prevention (STOP) Act of 2017 (Session Conni Elliot [phone]), effective June 12, 2018, all controlled substances must be electronically prescribed. Calling prescriptions to the pharmacy will cease to exist.  Prescription refills: Only during scheduled appointments. Applies to all prescriptions.  NOTE: The following applies primarily to controlled substances (Opioid* Pain Medications).   Type of encounter (visit): For patients receiving controlled substances, face-to-face visits are required. (Not an option or up to the patient.)  Patient's responsibilities: Pain Pills: Bring all pain pills to every appointment (except for procedure appointments). Pill Bottles: Bring pills in original pharmacy bottle. Always bring the newest bottle. Bring bottle, even if empty. Medication refills: You are responsible for knowing and keeping track of what medications you take and those you need refilled. The day before your appointment: write a list of all prescriptions that need to be refilled. The day of the appointment: give the list to the admitting nurse. Prescriptions will be written only during appointments. No prescriptions will be written on procedure days. If you forget a medication: it will not be "Called in",  "Faxed", or "electronically sent". You will need to get another appointment to get these prescribed. No early refills. Do not call asking to have your prescription filled early. Prescription Accuracy: You are responsible for carefully inspecting your prescriptions before leaving our office. Have the discharge nurse carefully go over each prescription with you, before taking them home. Make sure that your name is accurately spelled, that your address is correct. Check the name and dose of your medication to make sure it is accurate. Check the number of pills, and the written instructions to make sure they are clear and accurate. Make sure that you are given enough medication to last until your next medication refill appointment. Taking Medication: Take medication as prescribed. When it comes to controlled substances, taking less pills or less frequently than prescribed is permitted and encouraged. Never take more pills than instructed. Never take medication more frequently than prescribed.  Inform other Doctors: Always inform, all of your healthcare providers, of all the medications you take. Pain Medication from other Providers: You are not allowed to accept any additional pain medication from any other Doctor or Healthcare provider. There are two exceptions to this rule. (see below) In the event that you require additional pain medication, you are responsible for notifying us, as stated below. Cough Medicine: Often these contain an opioid, such as codeine or hydrocodone. Never accept or take cough medicine containing these opioids if you are already taking an opioid* medication. The combination may cause respiratory failure and death. Medication Agreement: You are responsible for carefully reading and following our Medication Agreement. This must be signed before receiving any prescriptions from our practice. Safely store a copy of your signed Agreement. Violations to the Agreement will result in no  further prescriptions. (Additional copies of our Medication Agreement are available upon request.) Laws, Rules, & Regulations: All patients are expected to follow all Federal  and [employer], Rules, & Regulations. Ignorance of the Laws does not constitute a valid excuse.  Illegal drugs and Controlled Substances: The use of illegal substances (including, but not limited to marijuana and its derivatives) and/or the illegal use of any controlled substances is strictly prohibited. Violation of this rule may result in the immediate and permanent discontinuation of any and all prescriptions being written by our practice. The use of any illegal substances is prohibited. Adopted CDC guidelines & recommendations: Target dosing levels will be at or below 60 MME/day. Use of benzodiazepines** is not recommended.  Exceptions: There are only two exceptions to the rule of not receiving pain medications from other Healthcare Providers. Exception #1 (Emergencies): In the event of an emergency (i.e.: accident requiring emergency care), you are allowed to receive additional pain medication. However, you are responsible for: As soon as you are able, call our office [phone], at any time of the day or night, and leave a message stating your name, the date and nature of the emergency, and the name and dose of the medication prescribed. In the event that your call is answered by a member of our staff, make sure to document and save the date, time, and the name of the person that took your information.  Exception #2 (Planned Surgery): In the event that you are scheduled by another doctor or dentist to have any type of surgery or procedure, you are allowed (for a period no longer than 30 days), to receive additional pain medication, for the acute post-op pain. However, in this case, you are responsible for picking up a copy of our "Post-op Pain Management for Surgeons" handout, and giving it to your surgeon or dentist.  This document is available at our office, and does not require an appointment to obtain it. Simply go to our office during business hours (Monday-Thursday from 8:00 AM to 4:00 PM) (Friday 8:00 AM to 12:00 Noon) or if you have a scheduled appointment with [place], prior to your surgery, and ask for it by name. In addition, you are responsible for: calling our office [phone], at any time of the day or night, and leaving a message stating your name, name of your surgeon, type of surgery, and date of procedure or surgery. Failure to comply with your responsibilities may result in termination of therapy involving the controlled substances.  *Opioid medications include: morphine, codeine, oxycodone, oxymorphone, hydrocodone, hydromorphone, meperidine, tramadol, tapentadol, buprenorphine, fentanyl, methadone. **Benzodiazepine medications include: diazepam (Valium), alprazolam (Xanax), clonazepam (Klonopine), lorazepam (Ativan), clorazepate (Tranxene), chlordiazepoxide (Librium), estazolam (Prosom), oxazepam (Serax), temazepam (Restoril), triazolam (Halcion) (Last updated: 05/10/2020) ____________________________________________________________________________________________  ____________________________________________________________________________________________  Medication Recommendations and Reminders  Applies to: All patients receiving prescriptions (written and/or electronic).  Medication Rules & Regulations: These rules and regulations exist for your safety and that of others. They are not flexible and neither are we. Dismissing or ignoring them will be considered "non-compliance" with medication therapy, resulting in complete and irreversible termination of such therapy. (See document titled "Medication Rules" for more details.) In all conscience, because of safety reasons, we cannot continue providing a therapy where the patient does not follow instructions.  Pharmacy of record:  Definition:  This is the pharmacy where your electronic prescriptions will be sent.  We do not endorse any particular pharmacy, however, we have experienced problems with [facility] not securing enough medication supply for the community. We do not restrict you in your choice of pharmacy. However, once we write for your prescriptions, we will NOT  be re-sending more prescriptions to fix restricted supply problems created by your pharmacy, or your insurance.  The pharmacy listed in the electronic medical record should be the one where you want electronic prescriptions to be sent. If you choose to change pharmacy, simply notify our nursing staff.  Recommendations: Keep all of your pain medications in a safe place, under lock and key, even if you live alone. We will NOT replace lost, stolen, or damaged medication. After you fill your prescription, take 1 week's worth of pills and put them away in a safe place. You should keep a separate, properly labeled bottle for this purpose. The remainder should be kept in the original bottle. Use this as your primary supply, until it runs out. Once it's gone, then you know that you have 1 week's worth of medicine, and it is time to come in for a prescription refill. If you do this correctly, it is unlikely that you will ever run out of medicine. To make sure that the above recommendation works, it is very important that you make sure your medication refill appointments are scheduled at least 1 week before you run out of medicine. To do this in an effective manner, make sure that you do not leave the office without scheduling your next medication management appointment. Always ask the nursing staff to show you in your prescription , when your medication will be running out. Then arrange for the receptionist to get you a return appointment, at least 7 days before you run out of medicine. Do not wait until you have 1 or 2 pills left, to come in. This is very poor planning and does not take  into consideration that we may need to cancel appointments due to bad weather, sickness, or emergencies affecting our staff. DO NOT ACCEPT A "Partial Fill": If for any reason your pharmacy does not have enough pills/tablets to completely fill or refill your prescription, do not allow for a "partial fill". The law allows the pharmacy to complete that prescription within 72 hours, without requiring a new prescription. If they do not fill the rest of your prescription within those 72 hours, you will need a separate prescription to fill the remaining amount, which we will NOT provide. If the reason for the partial fill is your insurance, you will need to talk to the pharmacist about payment alternatives for the remaining tablets, but again, DO NOT ACCEPT A PARTIAL FILL, unless you can trust your pharmacist to obtain the remainder of the pills within 72 hours.  Prescription refills and/or changes in medication(s):  Prescription refills, and/or changes in dose or medication, will be conducted only during scheduled medication management appointments. (Applies to both, written and electronic prescriptions.) No refills on procedure days. No medication will be changed or started on procedure days. No changes, adjustments, and/or refills will be conducted on a procedure day. Doing so will interfere with the diagnostic portion of the procedure. No phone refills. No medications will be "called into the pharmacy". No Fax refills. No weekend refills. No Holliday refills. No after hours refills.  Remember:  Business hours are:  Monday to Thursday 8:00 AM to 4:00 PM Provider's Schedule: Delano Metz, MD - Appointments are:  Medication management: Monday and Wednesday 8:00 AM to 4:00 PM Procedure day: Tuesday and Thursday 7:30 AM to 4:00 PM Edward Jolly, MD - Appointments are:  Medication management: Tuesday and Thursday 8:00 AM to 4:00 PM Procedure day: Monday and Wednesday 7:30 AM to 4:00 PM (Last update:  12/31/2019) ____________________________________________________________________________________________  ____________________________________________________________________________________________  CBD (cannabidiol) WARNING  Applicable to: All individuals currently taking or considering taking CBD (cannabidiol) and, more important, all patients taking opioid analgesic controlled substances (pain medication). (Example: oxycodone; oxymorphone; hydrocodone; hydromorphone; morphine; methadone; tramadol; tapentadol; fentanyl; buprenorphine; butorphanol; dextromethorphan; meperidine; codeine; etc.)  Legal status: CBD remains a Schedule I drug prohibited for any use. CBD is illegal with one exception. In the [place], CBD has a limited Education officer, environmental (FDA) approval for the treatment of two specific types of epilepsy disorders. Only one CBD product has been approved by the FDA for this purpose: "Epidiolex". FDA is aware that some companies are marketing products containing cannabis and cannabis-derived compounds in ways that violate the [employer], Drug and Cosmetic Act [facility] Act) and that may put the health and safety of consumers at risk. The FDA, a Federal agency, has not enforced the CBD status since 2018.   Legality: Some manufacturers ship CBD products nationally, which is illegal. Often such products are sold online and are therefore available throughout the country. CBD is openly sold in head shops and health food stores in some states where such sales have not been explicitly legalized. Selling unapproved products with unsubstantiated therapeutic claims is not only a violation of the law, but also can put patients at risk, as these products have not been proven to be safe or effective. Federal illegality makes it difficult to conduct research on CBD.  Reference: "FDA Regulation of Cannabis and Cannabis-Derived Products, Including Cannabidiol (CBD)" -  [employer]  Warning: CBD is not FDA approved and has not undergo the same manufacturing controls as prescription drugs.  This means that the purity and safety of available CBD may be questionable. Most of the time, despite manufacturer's claims, it is contaminated with THC (delta-9-tetrahydrocannabinol - the chemical in marijuana responsible for the "HIGH").  When this is the case, the [facility] contaminant will trigger a positive urine drug screen (UDS) test for Marijuana (carboxy-THC). Because a positive UDS for any illicit substance is a violation of our medication agreement, your opioid analgesics (pain medicine) may be permanently discontinued.  MORE ABOUT CBD  General Information: CBD  is a derivative of the Marijuana (cannabis sativa) plant discovered in 63. It is one of the 113 identified substances found in Marijuana. It accounts for up to 40% of the plant's extract. As of 2018, preliminary clinical studies on CBD included research for the treatment of anxiety, movement disorders, and pain. CBD is available and consumed in multiple forms, including inhalation of smoke or vapor, as an aerosol spray, and by mouth. It may be supplied as an oil containing CBD, capsules, dried cannabis, or as a liquid solution. CBD is thought not to be as psychoactive as THC (delta-9-tetrahydrocannabinol - the chemical in marijuana responsible for the "HIGH"). Studies suggest that CBD may interact with different biological target receptors in the body, including cannabinoid and other neurotransmitter receptors. As of 2018 the mechanism of action for its biological effects has not been determined.  Side-effects  Adverse reactions: Dry mouth, diarrhea, decreased appetite, fatigue, drowsiness, malaise, weakness, sleep disturbances, and others.  Drug interactions: CBC may interact with other medications  such as blood-thinners. (Last update: 01/17/2020) ____________________________________________________________________________________________  ____________________________________________________________________________________________  Drug Holidays (Slow)  What is a "Drug Holiday"? Drug Holiday: is the name given to the period of time during which a patient stops taking a medication(s) for the purpose of eliminating tolerance to the drug.  Benefits Improved effectiveness of opioids. Decreased opioid dose  needed to achieve benefits. Improved pain with lesser dose.  What is tolerance? Tolerance: is the progressive decreased in effectiveness of a drug due to its repetitive use. With repetitive use, the body gets use to the medication and as a consequence, it loses its effectiveness. This is a common problem seen with opioid pain medications. As a result, a larger dose of the drug is needed to achieve the same effect that used to be obtained with a smaller dose.  How long should a "Drug Holiday" last? You should stay off of the pain medicine for at least 14 consecutive days. (2 weeks)  Should I stop the medicine "cold [place]"? No. You should always coordinate with your Pain Specialist so that he/she can provide you with the correct medication dose to make the transition as smoothly as possible.  How do I stop the medicine? Slowly. You will be instructed to decrease the daily amount of pills that you take by one (1) pill every seven (7) days. This is called a "slow downward taper" of your dose. For example: if you normally take four (4) pills per day, you will be asked to drop this dose to three (3) pills per day for seven (7) days, then to two (2) pills per day for seven (7) days, then to one (1) per day for seven (7) days, and at the end of those last seven (7) days, this is when the "Drug Holiday" would start.   Will I have withdrawals? By doing a "slow downward taper" like this one, it  is unlikely that you will experience any significant withdrawal symptoms. Typically, what triggers withdrawals is the sudden stop of a high dose opioid therapy. Withdrawals can usually be avoided by slowly decreasing the dose over a prolonged period of time. If you do not follow these instructions and decide to stop your medication abruptly, withdrawals may be possible.  What are withdrawals? Withdrawals: refers to the wide range of symptoms that occur after stopping or dramatically reducing opiate drugs after heavy and prolonged use. Withdrawal symptoms do not occur to patients that use low dose opioids, or those who take the medication sporadically. Contrary to benzodiazepine (example: Valium, Xanax, etc.) or alcohol withdrawals ("Delirium Tremens"), opioid withdrawals are not lethal. Withdrawals are the physical manifestation of the body getting rid of the excess receptors.  Expected Symptoms Early symptoms of withdrawal may include: Agitation Anxiety Muscle aches Increased tearing Insomnia Runny nose Sweating Yawning  Late symptoms of withdrawal may include: Abdominal cramping Diarrhea Dilated pupils Goose bumps Nausea Vomiting  Will I experience withdrawals? Due to the slow nature of the taper, it is very unlikely that you will experience any.  What is a slow taper? Taper: refers to the gradual decrease in dose.  (Last update: 12/31/2019) ____________________________________________________________________________________________   Preparing for Procedure with Sedation Instructions: Oral Intake: Do not eat or drink anything for at least 8 hours prior to your procedure. Transportation: Public transportation is not allowed. Bring an adult driver. The driver must be physically present in our waiting room before any procedure can be started. Physical Assistance: Bring an adult capable of physically assisting you, in the event you need help. Blood Pressure Medicine: Take your  blood pressure medicine with a sip of water the morning of the procedure. Insulin: Take only  of your normal insulin dose. Preventing infections: Shower with an antibacterial soap the morning of your procedure. Build-up your immune system: Take 1000 mg of Vitamin C with every meal (3 times a  day) the day prior to your procedure. Pregnancy: If you are pregnant, call and cancel the procedure. Sickness: If you have a cold, fever, or any active infections, call and cancel the procedure. Arrival: You must be in the facility at least 30 minutes prior to your scheduled procedure. Children: Do not bring children with you. Dress appropriately: Bring dark clothing that you would not mind if they get stained. Valuables: Do not bring any jewelry or valuables. Procedure appointments are reserved for interventional treatments only. No Prescription Refills. No medication changes will be discussed during procedure appointments. No disability issues will be discussed.

## 2021-02-09 NOTE — Progress Notes (Signed)
Subjective:    Patient ID: Natasha Hanson, female    DOB: Mar 24, 1966, 55 y.o.   MRN: [id number] Chief Complaint  Patient presents with   Follow-up    Pt conv BLE ven reflux abi     Natasha Hanson is a 55 year old female that presents today for evaluation of left lower extremity edema.  The patient notes that this has been ongoing for approximately the last month.  The patient notes that the swelling gets somewhat better after she goes to bed during the day but after she wakes up and goes about her day the swelling grows worse and the cycle repeats itself daily.  She also endorses having some claudication-like symptoms.  She also notes that her bilateral feet are fairly numb as well.  Patient does have extensive lower back issues with no neurogenic pain.  The patient is a current smoker and has a family history of coronary artery disease.  The patient has tried compression but they were very painful for her and felt like they were cutting off her circulation, but she endorses that she only had the socks on from possibly few minutes.  She denies rest pain like symptoms.  She denies any open wounds or ulcerations.  She denies any injuries or surgeries to the area.  Today noninvasive studies show no evidence of DVT or superficial thrombophlebitis bilaterally.  No evidence of deep venous insufficiency or superficial venous reflux bilaterally.   Review of Systems  Cardiovascular:  Positive for leg swelling.  Neurological:  Positive for numbness.  All other systems reviewed and are negative.     Objective:   Physical Exam Vitals reviewed.  HENT:     Head: Normocephalic.  Cardiovascular:     Rate and Rhythm: Normal rate.     Pulses:          Dorsalis pedis pulses are 1+ on the right side and 1+ on the left side.  Pulmonary:     Effort: Pulmonary effort is normal.  Skin:    General: Skin is cool.  Neurological:     Mental Status: She is alert and oriented to person, place, and time.      Motor: Tremor present.  Psychiatric:        Mood and Affect: Mood normal.        Behavior: Behavior normal.        Thought Content: Thought content normal.        Judgment: Judgment normal.    BP 110/74   Pulse (!) 46   Ht 5\' 3"  (1.6 m)   Wt 130 lb (59 kg)   BMI 23.03 kg/m   Past Medical History:  Diagnosis Date   Allergy    Arthritis    COPD (chronic obstructive pulmonary disease) (HCC)    Glaucoma    Tremors of nervous system    neck    Social History   Socioeconomic History   Marital status: Married    Spouse name: Not on file   Number of children: Not on file   Years of education: Not on file   Highest education level: Not on file  Occupational History   Not on file  Tobacco Use   Smoking status: Every Day    Packs/day: 1.00    Years: 40.00    Pack years: 40.00    Types: Cigarettes   Smokeless tobacco: Never  Vaping Use   Vaping Use: Never used  Substance and Sexual Activity   Alcohol use: Yes  Comment: drinks occasionally   Drug use: Never   Sexual activity: Not on file  Other Topics Concern   Not on file  Social History Narrative   Not on file   Social Determinants of Health   Financial Resource Strain: High Risk   Difficulty of Paying Living Expenses: Very hard  Food Insecurity: No Food Insecurity   Worried About Running Out of Food in the Last Year: Never true   Ran Out of Food in the Last Year: Never true  Transportation Needs: Unmet Transportation Needs   Lack of Transportation (Medical): No   Lack of Transportation (Non-Medical): Yes  Physical Activity: Inactive   Days of Exercise per Week: 0 days   Minutes of Exercise per Session: 0 min  Stress: Stress Concern Present   Feeling of Stress : Very much  Social Connections: Socially Isolated   Frequency of Communication with Friends and Family: Once a week   Frequency of Social Gatherings with Friends and Family: Once a week   Attends Religious Services: Never   Database administrator  or Organizations: No   Attends Banker Meetings: Never   Marital Status: Widowed  Catering manager Violence: Not on file    Past Surgical History:  Procedure Laterality Date   ABDOMINAL HYSTERECTOMY     FOOT FUSION     FOOT SURGERY     REFRACTIVE SURGERY      Family History  Problem Relation Age of Onset   Other Mother        unkown COD   Anxiety disorder Mother    Pneumonia Father    Atrial fibrillation Daughter    Other Maternal Grandmother        unkown medical history   Other Maternal Grandfather        unkown medical history   Other Paternal Grandmother        unkown medical history   Other Paternal Grandfather        unkown medical history    Allergies  Allergen Reactions   No Known Allergies     CBC Latest Ref Rng & Units 12/30/2019 02/15/2019 01/05/2018  WBC 4.0 - 10.5 K/uL 11.5(H) 9.8 12.7(H)  Hemoglobin 12.0 - 15.0 g/dL 53.6 15.5(H) 15.2  Hematocrit 36.0 - 46.0 % 42.4 45.7 43.5  Platelets 150 - 400 K/uL 380 372 326      CMP     Component Value Date/Time   NA 141 01/19/2021 1153   NA 138 09/30/2014 1350   K 4.8 01/19/2021 1153   K 3.7 09/30/2014 1350   CL 109 (H) 01/19/2021 1153   CL 104 09/30/2014 1350   CO2 19 (L) 01/19/2021 1153   CO2 26 09/30/2014 1350   GLUCOSE 75 01/19/2021 1153   GLUCOSE 93 12/30/2019 1201   GLUCOSE 128 (H) 09/30/2014 1350   BUN 10 01/19/2021 1153   BUN 9 09/30/2014 1350   CREATININE 0.83 01/19/2021 1153   CREATININE 0.75 09/30/2014 1350   CALCIUM 9.3 01/19/2021 1153   CALCIUM 9.2 09/30/2014 1350   PROT 6.3 01/19/2021 1153   ALBUMIN 3.9 01/19/2021 1153   AST 13 01/19/2021 1153   ALT 6 01/19/2021 1153   ALKPHOS 69 01/19/2021 1153   BILITOT 0.4 01/19/2021 1153   GFRNONAA >60 12/30/2019 1201   GFRNONAA >60 09/30/2014 1350   GFRAA >60 12/30/2019 1201   GFRAA >60 09/30/2014 1350     No results found.     Assessment & Plan:   1. Pain  in both lower extremities Recommend:  I do not find evidence of  Vascular pathology that would explain the patient's symptoms  The patient has atypical pain symptoms for vascular disease  I do not find evidence of Vascular pathology that would explain the patient's symptoms and I suspect the patient is c/o pseudoclaudication.  Patient should have an evaluation of his LS spine which I defer to the primary service.  Noninvasive studies including venous ultrasound of the legs do not identify vascular problems  The patient should continue walking and begin a more formal exercise program. The patient should continue his antiplatelet therapy and aggressive treatment of the lipid abnormalities. The patient should begin wearing graduated compression socks 15-20 mmHg strength to control her mild edema.  Patient will follow-up with me on a PRN basis  Further work-up of her lower extremity pain is deferred to the primary service      2. Neurogenic pain This account for the burning in the patient's lower extremities as well as the discomfort when wearing compression.  3. Lymphedema I have had a long discussion with the patient regarding swelling and why it  causes symptoms.  We also discussed the importance of using compression socks that it is the primary treatment for lower extremity edema.  The patient will  beginning wearing the stockings first thing in the morning and removing them in the evening. The patient is instructed specifically not to sleep in the stockings.   In addition, behavioral modification will be initiated.  This will include frequent elevation, use of over the counter pain medications and exercise such as walking.  I have reviewed systemic causes for chronic edema such as liver, kidney and cardiac etiologies.  The patient denies problems with these organ systems.    Consideration for a lymph pump will also be made based upon the effectiveness of conservative therapy.  This would help to improve the edema control and prevent sequela such as  ulcers and infections  Patient return in 3 months to determine if effectiveness with conservative therapy.  Current Outpatient Medications on File Prior to Visit  Medication Sig Dispense Refill   baclofen (LIORESAL) 10 MG tablet Take (1/2) tablet (5mg  total) by mouth twice daily. 90 tablet 0   cetirizine (ZYRTEC) 10 MG tablet Take 1 tablet (10 mg total) by mouth once daily. 30 tablet 3   DULoxetine (CYMBALTA) 60 MG capsule Take 1 capsule (60 mg total) by mouth once daily. 30 capsule 0   gabapentin (NEURONTIN) 600 MG tablet TAKE ONE TABLET (600MG  TOTAL) BY MOUTH FOUR TIMES DAILY. (IN THE MORNING, NOON, EVENING, AND AT BEDTIME). 120 tablet 2   [START ON 02/13/2021] oxyCODONE (OXY IR/ROXICODONE) 5 MG immediate release tablet Take 1 tablet (5 mg total) by mouth every 8 (eight) hours as needed for severe pain. Must last 30 days. 90 tablet 0   [START ON 03/15/2021] oxyCODONE (OXY IR/ROXICODONE) 5 MG immediate release tablet Take 1 tablet (5 mg total) by mouth every 8 (eight) hours as needed for severe pain. Must last 30 days. 90 tablet 0   [START ON 04/14/2021] oxyCODONE (OXY IR/ROXICODONE) 5 MG immediate release tablet Take 1 tablet (5 mg total) by mouth every 8 (eight) hours as needed for severe pain. Must last 30 days. 90 tablet 0   PROAIR HFA 108 (90 Base) MCG/ACT inhaler INHALE 2 TO 4 PUFFS BY MOUTH EVERY 4 HOURS AS NEEDED (Patient taking differently: Needing two-three times daily during summer months) 51 g 99   propranolol (  INDERAL) 20 MG tablet Take 1 tablet (20 mg total) by mouth Two (2) times a day. 60 tablet 3   topiramate (TOPAMAX) 50 MG tablet Take 1 tablet (50 mg) in the morning, half a tablet (25 mg) in the afternoon and 1 tablet (50 mg) at night for 2 weeks. Then continue taking 1 tablet (50 mg) three times a day. (Patient taking differently: Patient is taking 50 mg three times a day) 90 tablet 2   triamcinolone cream (KENALOG) 0.1 % Apply 1 application topically 2 (two) times daily. 30 g 0    Vitamin D, Cholecalciferol, 25 MCG (1000 UT) TABS Take 1,000 Units by mouth daily.     No current facility-administered medications on file prior to visit.    There are no Patient Instructions on file for this visit. No follow-ups on file.   Georgiana Spinner, NP

## 2021-02-10 ENCOUNTER — Ambulatory Visit: Payer: Self-pay | Admitting: Licensed Clinical Social Worker

## 2021-02-10 ENCOUNTER — Other Ambulatory Visit: Payer: Self-pay

## 2021-02-10 DIAGNOSIS — F411 Generalized anxiety disorder: Secondary | ICD-10-CM

## 2021-02-10 DIAGNOSIS — F331 Major depressive disorder, recurrent, moderate: Secondary | ICD-10-CM

## 2021-02-10 NOTE — BH Specialist Note (Signed)
[facility] Follow Up Telephone Visit  MRN: [id number] Name: Natasha Hanson   Total time: 60 minutes  Types of Service: Telephone visit Patient consents to telephone visit and 2 patient identifiers were used to identify patient   Interpretor:No. Interpretor Name and Language: N/A  Subjective: Natasha Hanson is a 55 y.o. female accompanied by  herself Patient was referred by Hurman Horn, NP  for Mental Health. Patient reports the following symptoms/concerns: The patient reports that she has been okay since her last follow-up appointment. She explained that she is having difficulty with her feet swelling and is concerned that it may spread to her hands or other parts of her body. She expressed that she is frustrated not knowing what is causing her swelling and not knowing if she will be able to work or if she should file for disability. She shared that she is not certain if she can claim disability and still keep her [employer] widows pension and requests to be connected with someone who can help her figure this out. Ms. Blakley discussed her husband's passing and explained that she has to call around to find a physician who would sign off on his death certificate before he could be transported from their home to the morgue. She explained that took the entire day while the police and county coroner waited. She shared that she thinks back to that day in disbelief. She discussed relationship and other familial stressors impacting her life. She noted that her children do not come by enough to fill the emptiness of being a widow.The patient noted that her grandson who was hurt is doing much better and so is her daughter who underwent another heart procedure two weeks ago. The patient denied any suicidal or homicidal thoughts.  Duration of problem: Years; Severity of problem: moderate  Objective: Mood: Euthymic and Affect: Appropriate Risk of harm to self or others: No plan to harm self or  others  Life Context: Family and Social: see above School/Work: see above Self-Care: see above Life Changes: see above  Patient and/or Family's Strengths/Protective Factors: Concrete supports in place (healthy food, safe environments, etc.)  Goals Addressed: Patient will:  Reduce symptoms of: agitation, compulsions, insomnia, and stress   Increase knowledge and/or ability of: coping skills, healthy habits, self-management skills, and stress reduction   Demonstrate ability to: Increase healthy adjustment to current life circumstances and Increase adequate support systems for patient/family  Progress towards Goals: Ongoing  Interventions: Interventions utilized:  CBT Cognitive Behavioral Therapy was utilized by the clinician during today's follow up session. The clinician processed with the patient how they have been doing since the last follow-up session. The clinician provided a space for the patient to ventilate their frustrations regarding their current life circumstances. Clinician measured the patient's anxiety and depression on a numerical scale.  Clinician introduced distress tolerance skills to the patient and explained that negative emotions will usually lessen in intensity and pass over time. The clinician encouraged the patient to utilize their coping skills to deal with their current life circumstances.   Standardized Assessments completed: GAD-7 and PHQ 9 GAD-7   17 PHQ-9   16  Assessment: Patient currently experiencing see above.   Patient may benefit from see above.  Plan: Follow up with behavioral health clinician on : 02/22/2021 at 3:00 PM  Behavioral recommendations:  Referral(s): [facility] (In Clinic) "From scale of 1-10, how likely are you to follow plan?":   Judith Part, LCSWA

## 2021-02-16 ENCOUNTER — Ambulatory Visit: Payer: Self-pay | Admitting: Gerontology

## 2021-02-17 ENCOUNTER — Encounter: Payer: Self-pay | Admitting: Gerontology

## 2021-02-17 ENCOUNTER — Other Ambulatory Visit: Payer: Self-pay

## 2021-02-17 ENCOUNTER — Ambulatory Visit: Payer: Self-pay | Admitting: Gerontology

## 2021-02-17 DIAGNOSIS — M7989 Other specified soft tissue disorders: Secondary | ICD-10-CM | POA: Insufficient documentation

## 2021-02-17 DIAGNOSIS — H04129 Dry eye syndrome of unspecified lacrimal gland: Secondary | ICD-10-CM

## 2021-02-17 NOTE — Progress Notes (Signed)
Established Patient Office Visit  Subjective:  Patient ID: Natasha Hanson, female    DOB: 15-May-1966  Age: 55 y.o. MRN: [id number]  CC:  Chief Complaint  Patient presents with   Otalgia    Pt c/o bilateral ear pain. States it feels like there is water in her ears for the past 3 weeks.    Eye Problem    Pt c/o R eye dryness for the past 3 days. Pt has been using OTC dry eye drops.    Medication Refill    Pt requesting rx for naproxen, she has been taking OTC.    HPI Natasha Hanson is a 55 y/o female who has history of Allergy, Arthritis, COPD, Tremors of nervous system neck presents presents for follow up visit. She completed the course of Augmentin for Otitis externa to left ear. Currently, she continues to experience mild discomfort to her left ear, but denies otalgia, tinnitus, discharge and mastoid pain. She also c/o of dry eyes that has been going on for 3 days and reports moderate relief with using  otc Artificial tears. She denies vision changes and pain. She was seen on 02/09/21 Vascular surgery for bilateral lower extremity edema by Sara Chu FNP  and wearing compression stockings was recommended. She states that she doesn't have the resources to purchase compression stockings. Her legs continue to swell  up by the end of the day and reduces when she wakes up in the morning. She denies erythema, and claudication. She was also seen at the pain clinic on 02/09/21 by Dr Dossie Arbour F for her chronic back pain. She states that she takes Naproxyn for muscle spasm and requests refill. Overall, she states that she's doing well and offers no further complaint.   Past Medical History:  Diagnosis Date   Allergy    Arthritis    COPD (chronic obstructive pulmonary disease) (HCC)    Glaucoma    Tremors of nervous system    neck    Past Surgical History:  Procedure Laterality Date   ABDOMINAL HYSTERECTOMY     FOOT FUSION     FOOT SURGERY     REFRACTIVE SURGERY Left     Family History   Problem Relation Age of Onset   Other Mother        unkown COD   Anxiety disorder Mother    Pneumonia Father    Atrial fibrillation Daughter    Other Maternal Grandmother        unkown medical history   Other Maternal Grandfather        unkown medical history   Other Paternal Grandmother        unkown medical history   Other Paternal Grandfather        unkown medical history    Social History   Socioeconomic History   Marital status: Married    Spouse name: Not on file   Number of children: Not on file   Years of education: Not on file   Highest education level: Not on file  Occupational History   Not on file  Tobacco Use   Smoking status: Every Day    Packs/day: 1.00    Years: 40.00    Pack years: 40.00    Types: Cigarettes   Smokeless tobacco: Never  Vaping Use   Vaping Use: Never used  Substance and Sexual Activity   Alcohol use: Yes    Comment: drinks occasionally   Drug use: Never   Sexual activity: Not on  file  Other Topics Concern   Not on file  Social History Narrative   Not on file   Social Determinants of Health   Financial Resource Strain: High Risk   Difficulty of Paying Living Expenses: Very hard  Food Insecurity: No Food Insecurity   Worried About Charity fundraiser in the Last Year: Never true   Ran Out of Food in the Last Year: Never true  Transportation Needs: Unmet Transportation Needs   Lack of Transportation (Medical): No   Lack of Transportation (Non-Medical): Yes  Physical Activity: Inactive   Days of Exercise per Week: 0 days   Minutes of Exercise per Session: 0 min  Stress: Stress Concern Present   Feeling of Stress : Very much  Social Connections: Socially Isolated   Frequency of Communication with Friends and Family: Once a week   Frequency of Social Gatherings with Friends and Family: Once a week   Attends Religious Services: Never   Marine scientist or Organizations: No   Attends Archivist Meetings: Never    Marital Status: Widowed  Intimate Partner Violence: Not on file    Outpatient Medications Prior to Visit  Medication Sig Dispense Refill   baclofen (LIORESAL) 10 MG tablet Take (1/2) tablet (64m total) by mouth twice daily. 90 tablet 0   cetirizine (ZYRTEC) 10 MG tablet Take 1 tablet (10 mg total) by mouth once daily. 30 tablet 3   DULoxetine (CYMBALTA) 60 MG capsule Take 1 capsule (60 mg total) by mouth once daily. 30 capsule 0   gabapentin (NEURONTIN) 600 MG tablet TAKE ONE TABLET (600MG TOTAL) BY MOUTH FOUR TIMES DAILY. (IN THE MORNING, NOON, EVENING, AND AT BEDTIME). 120 tablet 2   oxyCODONE (OXY IR/ROXICODONE) 5 MG immediate release tablet Take 1 tablet (5 mg total) by mouth every 8 (eight) hours as needed for severe pain. Must last 30 days. 90 tablet 0   [START ON 03/15/2021] oxyCODONE (OXY IR/ROXICODONE) 5 MG immediate release tablet Take 1 tablet (5 mg total) by mouth every 8 (eight) hours as needed for severe pain. Must last 30 days. 90 tablet 0   [START ON 04/14/2021] oxyCODONE (OXY IR/ROXICODONE) 5 MG immediate release tablet Take 1 tablet (5 mg total) by mouth every 8 (eight) hours as needed for severe pain. Must last 30 days. 90 tablet 0   PROAIR HFA 108 (90 Base) MCG/ACT inhaler INHALE 2 TO 4 PUFFS BY MOUTH EVERY 4 HOURS AS NEEDED (Patient taking differently: Needing two-three times daily during summer months) 51 g 99   propranolol (INDERAL) 20 MG tablet Take 1 tablet (20 mg total) by mouth Two (2) times a day. 60 tablet 3   topiramate (TOPAMAX) 50 MG tablet Take 1 tablet (50 mg) in the morning, half a tablet (25 mg) in the afternoon and 1 tablet (50 mg) at night for 2 weeks. Then continue taking 1 tablet (50 mg) three times a day. (Patient taking differently: Patient is taking 50 mg three times a day) 90 tablet 2   triamcinolone cream (KENALOG) 0.1 % Apply 1 application topically 2 (two) times daily. 30 g 0   Vitamin D, Cholecalciferol, 25 MCG (1000 UT) TABS Take 1,000 Units by mouth  daily.     No facility-administered medications prior to visit.    Allergies  Allergen Reactions   No Known Allergies     ROS Review of Systems  Constitutional: Negative.   Eyes:  Negative for photophobia, pain, discharge, redness, itching and visual disturbance.  Dry eyes  Respiratory: Negative.    Cardiovascular: Negative.   Musculoskeletal:  Positive for back pain (chronic back pain).  Neurological: Negative.   Psychiatric/Behavioral: Negative.       Objective:    Physical Exam HENT:     Head: Normocephalic and atraumatic.  Eyes:     Extraocular Movements: Extraocular movements intact.     Conjunctiva/sclera: Conjunctivae normal.     Pupils: Pupils are equal, round, and reactive to light.  Cardiovascular:     Rate and Rhythm: Regular rhythm. Bradycardia present.  Pulmonary:     Effort: Pulmonary effort is normal.     Breath sounds: Normal breath sounds.  Abdominal:     General: Abdomen is flat. Bowel sounds are normal.     Palpations: Abdomen is soft.  Neurological:     General: No focal deficit present.     Mental Status: She is alert and oriented to person, place, and time. Mental status is at baseline.  Psychiatric:        Mood and Affect: Mood normal.        Behavior: Behavior normal.        Thought Content: Thought content normal.        Judgment: Judgment normal.    BP 112/66 (BP Location: Right Arm, Patient Position: Sitting, Cuff Size: Large)   Pulse (!) 42   Temp 97.7 F (36.5 C)   Resp 13   Ht 5' 3"  (1.6 m)   Wt 128 lb 9.6 oz (58.3 kg)   SpO2 95%   BMI 22.78 kg/m  Wt Readings from Last 3 Encounters:  02/17/21 128 lb 9.6 oz (58.3 kg)  02/09/21 130 lb (59 kg)  02/09/21 129 lb (58.5 kg)     Health Maintenance Due  Topic Date Due   COVID-19 Vaccine (1) Never done   Pneumococcal Vaccine 32-46 Years old (1 - PCV) Never done   HIV Screening  Never done   Hepatitis C Screening  Never done   Zoster Vaccines- Shingrix (1 of 2) Never done    PAP SMEAR-Modifier  Never done   COLONOSCOPY (Pts 45-41yr Insurance coverage will need to be confirmed)  Never done   MAMMOGRAM  Never done   INFLUENZA VACCINE  01/10/2021    There are no preventive care reminders to display for this patient.  Lab Results  Component Value Date   TSH 1.470 01/19/2021   Lab Results  Component Value Date   WBC 11.5 (H) 12/30/2019   HGB 14.7 12/30/2019   HCT 42.4 12/30/2019   MCV 98.4 12/30/2019   PLT 380 12/30/2019   Lab Results  Component Value Date   NA 141 01/19/2021   K 4.8 01/19/2021   CO2 19 (L) 01/19/2021   GLUCOSE 75 01/19/2021   BUN 10 01/19/2021   CREATININE 0.83 01/19/2021   BILITOT 0.4 01/19/2021   ALKPHOS 69 01/19/2021   AST 13 01/19/2021   ALT 6 01/19/2021   PROT 6.3 01/19/2021   ALBUMIN 3.9 01/19/2021   CALCIUM 9.3 01/19/2021   ANIONGAP 8 12/30/2019   EGFR 83 01/19/2021   No results found for: CHOL No results found for: HDL No results found for: LDLCALC No results found for: TRIG No results found for: CHOLHDL No results found for: HGBA1C    Assessment & Plan:    1. Swelling of both lower extremities - She was encouraged to elevate legs while siting down. OSoledadwill evaluate buying compression stockings for patient. She was advised to go  to the ED for worsening symptoms.  2. Dry eye - No redness, edema or visual changes , she was advised to continue using otc tear drops and go to the ED for worsening symptoms.      Follow-up: Return in about 6 weeks (around 03/31/2021), or if symptoms worsen or fail to improve.    Daevion Navarette Jerold Coombe, NP

## 2021-02-22 ENCOUNTER — Ambulatory Visit: Payer: Self-pay | Admitting: Licensed Clinical Social Worker

## 2021-02-22 ENCOUNTER — Other Ambulatory Visit: Payer: Self-pay

## 2021-02-22 DIAGNOSIS — F331 Major depressive disorder, recurrent, moderate: Secondary | ICD-10-CM

## 2021-02-22 DIAGNOSIS — F411 Generalized anxiety disorder: Secondary | ICD-10-CM
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
## 2021-03-01 ENCOUNTER — Other Ambulatory Visit: Payer: Self-pay

## 2021-03-01 MED ORDER — AEROCHAMBER PLUS MISC
0 refills | Status: DC
  Filled 2021-03-01: qty 1, 30d supply, fill #0

## 2021-03-04 ENCOUNTER — Other Ambulatory Visit: Payer: Self-pay | Admitting: Gerontology

## 2021-03-04 ENCOUNTER — Other Ambulatory Visit: Payer: Self-pay

## 2021-03-08 ENCOUNTER — Other Ambulatory Visit: Payer: Self-pay

## 2021-03-08 ENCOUNTER — Ambulatory Visit: Payer: Self-pay | Admitting: Licensed Clinical Social Worker

## 2021-03-08 ENCOUNTER — Other Ambulatory Visit: Payer: Self-pay | Admitting: Gerontology

## 2021-03-08 ENCOUNTER — Telehealth: Payer: Self-pay | Admitting: Licensed Clinical Social Worker

## 2021-03-08 DIAGNOSIS — Z8659 Personal history of other mental and behavioral disorders: Secondary | ICD-10-CM

## 2021-03-08 DIAGNOSIS — F331 Major depressive disorder, recurrent, moderate: Secondary | ICD-10-CM

## 2021-03-08 DIAGNOSIS — F411 Generalized anxiety disorder: Secondary | ICD-10-CM

## 2021-03-08 MED ORDER — DULOXETINE HCL 60 MG PO CPEP
60.0000 mg | ORAL_CAPSULE | Freq: Every day | ORAL | 0 refills | Status: DC
Start: 2021-03-08 — End: 2021-04-04
  Filled 2021-03-08: qty 30, 30d supply, fill #0

## 2021-03-09 ENCOUNTER — Other Ambulatory Visit: Payer: Self-pay

## 2021-03-09 NOTE — BH Specialist Note (Signed)
Patient arrived upset stated that she was called by an [facility] staff member who told her today's appointment was in person. She explained that she left roofers at her house and rushed over. Clinician apologized for the error and offered to reschedule the patient's appointment. Patient agreed and requested Clinician message her PCP regarding medication refills. Patient was rescheduled for 03/10/2021 at 9:00 AM and a message was sent to her provider regarding her medication refill request.

## 2021-03-10 ENCOUNTER — Institutional Professional Consult (permissible substitution): Payer: Self-pay | Admitting: Licensed Clinical Social Worker

## 2021-03-10 ENCOUNTER — Other Ambulatory Visit: Payer: Self-pay

## 2021-03-10 ENCOUNTER — Ambulatory Visit: Payer: Self-pay | Admitting: Licensed Clinical Social Worker

## 2021-03-10 DIAGNOSIS — F411 Generalized anxiety disorder: Secondary | ICD-10-CM

## 2021-03-10 DIAGNOSIS — F331 Major depressive disorder, recurrent, moderate: Secondary | ICD-10-CM

## 2021-03-10 MED FILL — Topiramate Tab 50 MG: ORAL | 30 days supply | Qty: 90 | Fill #0 | Status: AC
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
## 2021-03-10 NOTE — Telephone Encounter (Signed)
Left MessageCommunicated - Patient needs to be scheduled with Traci Sermon for a phone visit ASAP. UPDATE: patient was rescheduled.

## 2021-03-15 ENCOUNTER — Ambulatory Visit: Payer: Self-pay
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
## 2021-03-17 ENCOUNTER — Ambulatory Visit: Payer: Self-pay | Admitting: Licensed Clinical Social Worker

## 2021-03-22 ENCOUNTER — Telehealth: Payer: Self-pay | Admitting: Licensed Clinical Social Worker

## 2021-03-22 NOTE — Telephone Encounter (Signed)
Client was referred to [employer] for heating services to make an appointment with them, and to complete an application with Social Services for the crisis intervention program. BG will follow up every week for 3 weeks.

## 2021-03-23 ENCOUNTER — Other Ambulatory Visit: Payer: Self-pay | Admitting: Pain Medicine

## 2021-03-23 ENCOUNTER — Telehealth: Payer: Self-pay | Admitting: *Deleted

## 2021-03-23 NOTE — Telephone Encounter (Signed)
Per Dr. Laban Emperor request. I have called Natasha Hanson pharmacy to cancel oxycodone prescription. I have left a message for patient. She will need to make a face to face  appointment for med management.
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
## 2021-03-24 ENCOUNTER — Telehealth: Payer: Self-pay | Admitting: Licensed Clinical Social Worker

## 2021-03-24 NOTE — Telephone Encounter (Signed)
Clinician spoke to the patient processed with the patient a difficult memory, and encouraged her to use her coping skills; confirmed the patient's next appointment on 02/27/2021 at 12:00 Noon.

## 2021-03-27 NOTE — Progress Notes (Deleted)
The patient canceled the appointment indicating having no transportation.

## 2021-03-28 ENCOUNTER — Other Ambulatory Visit: Payer: Self-pay | Admitting: Pain Medicine

## 2021-03-28 ENCOUNTER — Encounter: Payer: Self-pay | Admitting: Pain Medicine

## 2021-03-28 DIAGNOSIS — Z79899 Other long term (current) drug therapy: Secondary | ICD-10-CM

## 2021-03-28 DIAGNOSIS — G8929 Other chronic pain: Secondary | ICD-10-CM

## 2021-03-28 DIAGNOSIS — Z79891 Long term (current) use of opiate analgesic: Secondary | ICD-10-CM

## 2021-03-28 DIAGNOSIS — G894 Chronic pain syndrome: Secondary | ICD-10-CM

## 2021-03-28 NOTE — Progress Notes (Unsigned)
The patient canceled on the morning of the appointment indicating having no transportation.

## 2021-03-29 ENCOUNTER — Ambulatory Visit: Payer: Self-pay | Admitting: Licensed Clinical Social Worker

## 2021-03-29 ENCOUNTER — Other Ambulatory Visit: Payer: Self-pay

## 2021-03-29 DIAGNOSIS — F411 Generalized anxiety disorder: Secondary | ICD-10-CM

## 2021-03-29 DIAGNOSIS — F331 Major depressive disorder, recurrent, moderate: Secondary | ICD-10-CM

## 2021-03-29 NOTE — BH Specialist Note (Signed)
Integrated Behavioral Health Follow Up Telephone Visit  MRN: [id number] Name: Natasha Hanson   Total time: 60 minutes  Types of Service: Telephone visit Patient consents to telephone visit and 2 patient identifiers were used to identify patient   Interpretor:No. Interpretor Name and Language: N/A  Subjective: Natasha Hanson is a 55 y.o. female accompanied by  herself Patient was referred by Natasha Shadow, NP for Mental Health. Patient reports the following symptoms/concerns: The patient reported that she has been doing well since her last follow-up appointment. She discussed financial stressors impacting her life. She noted that she has reconciled with her ex boyfriend and this has caused some conflict with her daughter. [name]' noted that she feels she is old enough to make her own discission regarding her relationship and her daughter should respect her. She discussed additional family stressors. [name] explained that she is going to be watching her boyfriend closer and he has a lot to prove before she trusts him again. She shared her relationship with her dog and her concerns regarding his health. The patient denied any further complaints regarding her mood, and stated overall she feels she is doing okay. [name] denied any suicidal or homicidal thoughts.  Duration of problem: Years; Severity of problem: moderate  Objective: Mood: Euthymic and Affect: Appropriate Risk of harm to self or others: No plan to harm self or others  Life Context: Family and Social: see above School/Work: see above Self-Care: see above Life Changes: see above  Patient and/or Family's Strengths/Protective Factors: Concrete supports in place (healthy food, safe environments, etc.)  Goals Addressed: Patient will:  Reduce symptoms of: agitation, anxiety, depression, and stress   Increase knowledge and/or ability of: coping skills, healthy habits, self-management skills, and stress reduction    Demonstrate ability to: Increase healthy adjustment to current life circumstances and Increase adequate support systems for patient/family  Progress towards Goals: Ongoing  Interventions: Interventions utilized:  CBT Cognitive Behavioral Therapy was utilized by the clinician during today's follow up session. Clinician met with patient to identify needs related to stressors and functioning, and assess and monitor for signs and symptoms of anxiety and depression, and assess safety. The clinician processed with the patient how they have been doing since the last follow-up session. Clinician encouraged the client to use her coping skills and to incorporate self care into her daily routine to help her deal with her current life circumstances. Session ended with scheduling.  Standardized Assessments completed: GAD-7 and PHQ 9 GAD-7 =  5 PHQ-9 = 10  Assessment: Patient currently experiencing see above.   Patient may benefit from see above.  Plan: Follow up with behavioral health clinician on : 04/14/2021 at 11:00 AM  Behavioral recommendations:  Referral(s): [place] (In Clinic) "From scale of 1-10, how likely are you to follow plan?":   Lesli Albee, LCSWA

## 2021-03-31 ENCOUNTER — Ambulatory Visit: Payer: Self-pay | Admitting: Gerontology

## 2021-04-04 ENCOUNTER — Other Ambulatory Visit: Payer: Self-pay

## 2021-04-04 ENCOUNTER — Other Ambulatory Visit: Payer: Self-pay | Admitting: Pain Medicine

## 2021-04-04 ENCOUNTER — Other Ambulatory Visit: Payer: Self-pay | Admitting: Gerontology

## 2021-04-04 DIAGNOSIS — M792 Neuralgia and neuritis, unspecified: Secondary | ICD-10-CM

## 2021-04-04 DIAGNOSIS — Z8659 Personal history of other mental and behavioral disorders: Secondary | ICD-10-CM

## 2021-04-05 ENCOUNTER — Other Ambulatory Visit: Payer: Self-pay

## 2021-04-05 MED ORDER — TOPIRAMATE 50 MG PO TABS
ORAL_TABLET | ORAL | 0 refills | Status: DC
  Filled 2021-04-05: qty 90, 30d supply, fill #0

## 2021-04-05 MED ORDER — DULOXETINE HCL 30 MG PO CPEP
60.0000 mg | ORAL_CAPSULE | Freq: Two times a day (BID) | ORAL | 0 refills | Status: DC
Start: 2021-04-05 — End: 2021-10-18
  Filled 2021-04-05: qty 60, 30d supply, fill #0

## 2021-04-06 ENCOUNTER — Encounter: Payer: Self-pay | Admitting: Gerontology

## 2021-04-06 ENCOUNTER — Ambulatory Visit: Payer: Self-pay | Admitting: Adult Health

## 2021-04-06 ENCOUNTER — Other Ambulatory Visit: Payer: Self-pay

## 2021-04-06 DIAGNOSIS — M542 Cervicalgia: Secondary | ICD-10-CM

## 2021-04-06 MED ORDER — IBUPROFEN 800 MG PO TABS
800.0000 mg | ORAL_TABLET | Freq: Three times a day (TID) | ORAL | 0 refills | Status: DC | PRN
  Filled 2021-04-06: qty 60, 20d supply, fill #0

## 2021-04-06 NOTE — Patient Instructions (Signed)
Cervical Radiculopathy  Cervical radiculopathy means that a nerve in the neck (a cervical nerve) is pinched or bruised. This can happen because of an injury to the cervical spine (vertebrae) in the neck, or as a normal part of getting older. This can cause pain or loss of feeling (numbness) that runs from your neck all the way down to your arm and fingers. Often, this condition gets better with rest. Treatment may be needed if the conditiondoes not get better. What are the causes? A neck injury. A bulging disk in your spine. Muscle movements that you cannot control (muscle spasms). Tight muscles in your neck due to overuse. Arthritis. Breakdown in the bones and joints of the spine (spondylosis) due to getting older. Bone spurs that form near the nerves in the neck. What are the signs or symptoms? Pain. The pain may: Run from the neck to the arm and hand. Be very bad or irritating. Be worse when you move your neck. Loss of feeling or tingling in your arm or hand. Weakness in your arm or hand, in very bad cases. How is this treated? In many cases, treatment is not needed for this condition. With rest, the condition often gets better over time. If treatment is needed, options may include: Wearing a soft neck collar (cervical collar) for short periods of time, as told by your doctor. Doing exercises (physical therapy) to strengthen your neck muscles. Taking medicines. Having shots (injections) in your spine, in very bad cases. Having surgery. This may be needed if other treatments do not help. The type of surgery that is used depends on the cause of your condition. Follow these instructions at home: If you have a soft neck collar: Wear it as told by your doctor. Remove it only as told by your doctor. Ask your doctor if you can remove the collar for cleaning and bathing. If you are allowed to remove the collar for cleaning or bathing: Follow instructions from your doctor about how to remove  the collar safely. Clean the collar by wiping it with mild soap and water and drying it completely. Take out any removable pads in the collar every 1-2 days. Wash them by hand with soap and water. Let them air-dry completely before you put them back in the collar. Check your skin under the collar for redness or sores. If you see any, tell your doctor. Managing pain     Take over-the-counter and prescription medicines only as told by your doctor. If told, put ice on the painful area. If you have a soft neck collar, remove it as told by your doctor. Put ice in a plastic bag. Place a towel between your skin and the bag. Leave the ice on for 20 minutes, 2-3 times a day. If using ice does not help, you can try using heat. Use the heat source that your doctor recommends, such as a moist heat pack or a heating pad. Place a towel between your skin and the heat source. Leave the heat on for 20-30 minutes. Remove the heat if your skin turns bright red. This is very important if you are unable to feel pain, heat, or cold. You may have a greater risk of getting burned. You may try a gentle neck and shoulder rub (massage). Activity Rest as needed. Return to your normal activities as told by your doctor. Ask your doctor what activities are safe for you. Do exercises as told by your doctor or physical therapist. Do not lift anything that   is heavier than 10 lb (4.5 kg) until your doctor tells you that it is safe. General instructions Use a flat pillow when you sleep. Do not drive while wearing a soft neck collar. If you do not have a soft neck collar, ask your doctor if it is safe to drive while your neck heals. Ask your doctor if the medicine prescribed to you requires you to avoid driving or using heavy machinery. Do not use any products that contain nicotine or tobacco, such as cigarettes, e-cigarettes, and chewing tobacco. These can delay healing. If you need help quitting, ask your doctor. Keep all  follow-up visits as told by your doctor. This is important. Contact a doctor if: Your condition does not get better with treatment. Get help right away if: Your pain gets worse and is not helped with medicine. You lose feeling or feel weak in your hand, arm, face, or leg. You have a high fever. You have a stiff neck. You cannot control when you poop or pee (have incontinence). You have trouble with walking, balance, or talking. Summary Cervical radiculopathy means that a nerve in the neck is pinched or bruised. A nerve can get pinched from a bulging disk, arthritis, an injury to the neck, or other causes. Symptoms include pain, tingling, or loss of feeling that goes from the neck into the arm or hand. Weakness in your arm or hand can happen in very bad cases. Treatment may include resting, wearing a soft neck collar, and doing exercises. You might need to take medicines for pain. In very bad cases, shots or surgery may be needed. This information is not intended to replace advice given to you by your health care provider. Make sure you discuss any questions you have with your healthcare provider. Document Revised: 04/19/2018 Document Reviewed: 04/19/2018 Elsevier Patient Education  2022 [employer]  
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
## 2021-04-07 ENCOUNTER — Other Ambulatory Visit: Payer: Self-pay

## 2021-04-07 ENCOUNTER — Telehealth: Payer: Self-pay | Admitting: Licensed Clinical Social Worker
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
## 2021-04-14 ENCOUNTER — Telehealth: Payer: Self-pay | Admitting: Licensed Clinical Social Worker

## 2021-04-14 ENCOUNTER — Ambulatory Visit: Payer: Self-pay | Admitting: Licensed Clinical Social Worker
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
## 2021-04-14 NOTE — Telephone Encounter (Signed)
Called the patient twice during today's scheduled appointment; no answer, left a voicemail with the clinic contact information so they may reschedule.   

## 2021-04-18 ENCOUNTER — Other Ambulatory Visit: Payer: Self-pay

## 2021-04-18 MED ORDER — OSELTAMIVIR PHOSPHATE 75 MG PO CAPS: ORAL_CAPSULE | ORAL | 0 refills | Status: DC

## 2021-04-21 ENCOUNTER — Telehealth: Payer: Self-pay | Admitting: Licensed Clinical Social Worker
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
## 2021-04-25 ENCOUNTER — Telehealth: Payer: Self-pay

## 2021-04-25 ENCOUNTER — Other Ambulatory Visit: Payer: Self-pay | Admitting: Gerontology

## 2021-04-25 ENCOUNTER — Other Ambulatory Visit: Payer: Self-pay

## 2021-04-25 DIAGNOSIS — M792 Neuralgia and neuritis, unspecified: Secondary | ICD-10-CM

## 2021-04-25 NOTE — Telephone Encounter (Signed)
Pr is out of Gabapentin need a refill

## 2021-04-26 ENCOUNTER — Other Ambulatory Visit: Payer: Self-pay | Admitting: Pain Medicine

## 2021-04-26 ENCOUNTER — Other Ambulatory Visit: Payer: Self-pay

## 2021-04-26 DIAGNOSIS — M792 Neuralgia and neuritis, unspecified: Secondary | ICD-10-CM
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
## 2021-04-28 ENCOUNTER — Telehealth: Payer: Self-pay | Admitting: Licensed Clinical Social Worker
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
## 2021-04-28 NOTE — Telephone Encounter (Signed)
Called to check in and follow up on last message. No answer and mailbox was full.

## 2021-05-04 ENCOUNTER — Ambulatory Visit: Payer: Self-pay | Admitting: Gerontology

## 2021-05-10 ENCOUNTER — Ambulatory Visit (INDEPENDENT_AMBULATORY_CARE_PROVIDER_SITE_OTHER): Payer: Self-pay | Admitting: Vascular Surgery

## 2021-05-11 ENCOUNTER — Other Ambulatory Visit: Payer: Self-pay

## 2021-05-11 ENCOUNTER — Ambulatory Visit: Payer: Self-pay | Attending: Pain Medicine | Admitting: Pain Medicine

## 2021-05-11 ENCOUNTER — Encounter: Payer: Self-pay | Admitting: Pain Medicine

## 2021-05-11 VITALS — BP 126/69 | HR 75 | Temp 97.2°F | Resp 16 | Ht 63.0 in | Wt 122.6 lb

## 2021-05-11 DIAGNOSIS — Z9114 Patient's other noncompliance with medication regimen: Secondary | ICD-10-CM | POA: Insufficient documentation

## 2021-05-11 DIAGNOSIS — G8929 Other chronic pain: Secondary | ICD-10-CM | POA: Insufficient documentation

## 2021-05-11 DIAGNOSIS — Z91148 Patient's other noncompliance with medication regimen for other reason: Secondary | ICD-10-CM | POA: Insufficient documentation

## 2021-05-11 DIAGNOSIS — M25552 Pain in left hip: Secondary | ICD-10-CM | POA: Insufficient documentation

## 2021-05-11 DIAGNOSIS — M533 Sacrococcygeal disorders, not elsewhere classified: Secondary | ICD-10-CM | POA: Insufficient documentation

## 2021-05-11 DIAGNOSIS — M25561 Pain in right knee: Secondary | ICD-10-CM | POA: Insufficient documentation

## 2021-05-11 DIAGNOSIS — M25551 Pain in right hip: Secondary | ICD-10-CM | POA: Insufficient documentation

## 2021-05-11 DIAGNOSIS — M47816 Spondylosis without myelopathy or radiculopathy, lumbar region: Secondary | ICD-10-CM | POA: Insufficient documentation

## 2021-05-11 DIAGNOSIS — M546 Pain in thoracic spine: Secondary | ICD-10-CM | POA: Insufficient documentation

## 2021-05-11 DIAGNOSIS — G894 Chronic pain syndrome: Secondary | ICD-10-CM | POA: Insufficient documentation

## 2021-05-11 DIAGNOSIS — M542 Cervicalgia: Secondary | ICD-10-CM | POA: Insufficient documentation

## 2021-05-11 DIAGNOSIS — M545 Low back pain, unspecified: Secondary | ICD-10-CM | POA: Insufficient documentation

## 2021-05-11 DIAGNOSIS — R937 Abnormal findings on diagnostic imaging of other parts of musculoskeletal system: Secondary | ICD-10-CM | POA: Insufficient documentation

## 2021-05-11 DIAGNOSIS — M25562 Pain in left knee: Secondary | ICD-10-CM | POA: Insufficient documentation

## 2021-05-11 DIAGNOSIS — M7918 Myalgia, other site: Secondary | ICD-10-CM | POA: Insufficient documentation

## 2021-05-11 NOTE — Progress Notes (Signed)
Safety precautions to be maintained throughout the outpatient stay will include: orient to surroundings, keep bed in low position, maintain call bell within reach at all times, provide assistance with transfer out of bed and ambulation.   Nursing Pain Medication Assessment:  Safety precautions to be maintained throughout the outpatient stay will include: orient to surroundings, keep bed in low position, maintain call bell within reach at all times, provide assistance with transfer out of bed and ambulation.  Medication Inspection Compliance: Natasha Hanson did not comply with our request to bring her pills to be counted. She was reminded that bringing the medication bottles, even when empty, is a requirement.  Medication: None brought in. Pill/Patch Count: None available to be counted. Bottle Appearance: No container available. Did not bring bottle(s) to appointment. Filled Date: N/A Last Medication intake:   Last took early November when she ran out.   However patient received Oxycodone 5mg  from [facility] and ran out 05/10/21.   05/12/21, RN
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
## 2021-05-11 NOTE — Patient Instructions (Signed)
______________________________________________________________________  Preparing for Procedure with Sedation  NOTICE: Due to recent regulatory changes, starting on January 10, 2021, procedures requiring intravenous (IV) sedation will no longer be performed at the [facility].  These types of procedures are required to be performed at [facility] ambulatory surgery facility.  We are very sorry for the inconvenience.  Procedure appointments are limited to planned procedures: No Prescription Refills. No disability issues will be discussed. No medication changes will be discussed.  Instructions: Oral Intake: Do not eat or drink anything for at least 8 hours prior to your procedure. (Exception: Blood Pressure Medication. See below.) Transportation: A driver is required. You may not drive yourself after the procedure. Blood Pressure Medicine: Do not forget to take your blood pressure medicine with a sip of water the morning of the procedure. If your Diastolic (lower reading) is above 100 mmHg, elective cases will be cancelled/rescheduled. Blood thinners: These will need to be stopped for procedures. Notify our staff if you are taking any blood thinners. Depending on which one you take, there will be specific instructions on how and when to stop it. Diabetics on insulin: Notify the staff so that you can be scheduled 1st case in the morning. If your diabetes requires high dose insulin, take only  of your normal insulin dose the morning of the procedure and notify the staff that you have done so. Preventing infections: Shower with an antibacterial soap the morning of your procedure. Build-up your immune system: Take 1000 mg of Vitamin C with every meal (3 times a day) the day prior to your procedure. Antibiotics: Inform the staff if you have a condition or reason that requires you to take antibiotics before dental procedures. Pregnancy: If you are pregnant, call and cancel the procedure. Sickness: If  you have a cold, fever, or any active infections, call and cancel the procedure. Arrival: You must be in the facility at least 30 minutes prior to your scheduled procedure. Children: Do not bring children with you. Dress appropriately: Bring dark clothing that you would not mind if they get stained. Valuables: Do not bring any jewelry or valuables.  Reasons to call and reschedule or cancel your procedure: (Following these recommendations will minimize the risk of a serious complication.) Surgeries: Avoid having procedures within 2 weeks of any surgery. (Avoid for 2 weeks before or after any surgery). Flu Shots: Avoid having procedures within 2 weeks of a flu shots. (Avoid for 2 weeks before or after immunizations). Barium: Avoid having a procedure within 7-10 days after having had a radiological study involving the use of radiological contrast. (Myelograms, Barium swallow or enema study). Heart attacks: Avoid any elective procedures or surgeries for the initial 6 months after a "Myocardial Infarction" (Heart Attack). Blood thinners: It is imperative that you stop these medications before procedures. Let us know if you if you take any blood thinner.  Infection: Avoid procedures during or within two weeks of an infection (including chest colds or gastrointestinal problems). Symptoms associated with infections include: Localized redness, fever, chills, night sweats or profuse sweating, burning sensation when voiding, cough, congestion, stuffiness, runny nose, sore throat, diarrhea, nausea, vomiting, cold or Flu symptoms, recent or current infections. It is specially important if the infection is over the area that we intend to treat. Heart and lung problems: Symptoms that may suggest an active cardiopulmonary problem include: cough, chest pain, breathing difficulties or shortness of breath, dizziness, ankle swelling, uncontrolled high or unusually low blood pressure, and/or palpitations. If you are  experiencing any of these symptoms, cancel your procedure and contact your primary care physician for an evaluation.  Remember:  Regular Business hours are:  Monday to Thursday 8:00 AM to 4:00 PM  Provider's Schedule: Delano Metz, MD:  Procedure days: Tuesday and Thursday 7:30 AM to 4:00 PM  Edward Jolly, MD:  Procedure days: Monday and Wednesday 7:30 AM to 4:00 PM ______________________________________________________________________  Facet Blocks Patient Information  Description: The facets are joints in the spine between the vertebrae.  Like any joints in the body, facets can become irritated and painful.  Arthritis can also effect the facets.  By injecting steroids and local anesthetic in and around these joints, we can temporarily block the nerve supply to them.  Steroids act directly on irritated nerves and tissues to reduce selling and inflammation which often leads to decreased pain.  Facet blocks may be done anywhere along the spine from the neck to the low back depending upon the location of your pain.   After numbing the skin with local anesthetic (like Novocaine), a small needle is passed onto the facet joints under x-ray guidance.  You may experience a sensation of pressure while this is being done.  The entire block usually lasts about 15-25 minutes.   Conditions which may be treated by facet blocks:  Low back/buttock pain Neck/shoulder pain Certain types of headaches  Preparation for the injection:  Do not eat any solid food or dairy products within 8 hours of your appointment. You may drink clear liquid up to 3 hours before appointment.  Clear liquids include water, black coffee, juice or soda.  No milk or cream please. You may take your regular medication, including pain medications, with a sip of water before your appointment.  Diabetics should hold regular insulin (if taken separately) and take 1/2 normal NPH dose the morning of the procedure.  Carry some sugar  containing items with you to your appointment. A driver must accompany you and be prepared to drive you home after your procedure. Bring all your current medications with you. An IV may be inserted and sedation may be given at the discretion of the physician. A blood pressure cuff, EKG and other monitors will often be applied during the procedure.  Some patients may need to have extra oxygen administered for a short period. You will be asked to provide medical information, including your allergies and medications, prior to the procedure.  We must know immediately if you are taking blood thinners (like Coumadin/Warfarin) or if you are allergic to IV iodine contrast (dye).  We must know if you could possible be pregnant.  Possible side-effects:  Bleeding from needle site Infection (rare, may require surgery) Nerve injury (rare) Numbness & tingling (temporary) Difficulty urinating (rare, temporary) Spinal headache (a headache worse with upright posture) Light-headedness (temporary) Pain at injection site (serveral days) Decreased blood pressure (rare, temporary) Weakness in arm/leg (temporary) Pressure sensation in back/neck (temporary)   Call if you experience:  Fever/chills associated with headache or increased back/neck pain Headache worsened by an upright position New onset, weakness or numbness of an extremity below the injection site Hives or difficulty breathing (go to the emergency room) Inflammation or drainage at the injection site(s) Severe back/neck pain greater than usual New symptoms which are concerning to you  Please note:  Although the local anesthetic injected can often make your back or neck feel good for several hours after the injection, the pain will likely return. It takes 3-7 days for steroids to work.  You may not notice any pain relief for at least one week.  If effective, we will often do a series of 2-3 injections spaced 3-6 weeks apart to maximally  decrease your pain.  After the initial series, you may be a candidate for a more permanent nerve block of the facets.  If you have any questions, please call #[phone] [facility]

## 2021-05-12 ENCOUNTER — Other Ambulatory Visit: Payer: Self-pay

## 2021-05-12 ENCOUNTER — Encounter: Payer: Self-pay | Admitting: Gerontology

## 2021-05-12 ENCOUNTER — Telehealth: Payer: Self-pay | Admitting: Licensed Clinical Social Worker

## 2021-05-12 ENCOUNTER — Ambulatory Visit: Payer: Self-pay | Admitting: Gerontology

## 2021-05-12 VITALS — BP 108/76 | HR 59 | Temp 98.3°F | Ht 63.0 in | Wt 125.4 lb

## 2021-05-12 DIAGNOSIS — M792 Neuralgia and neuritis, unspecified: Secondary | ICD-10-CM

## 2021-05-12 MED ORDER — GABAPENTIN 600 MG PO TABS
600.0000 mg | ORAL_TABLET | Freq: Three times a day (TID) | ORAL | 0 refills | Status: DC
  Filled 2021-05-12: qty 90, 30d supply, fill #0
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
## 2021-05-12 NOTE — Telephone Encounter (Signed)
Said she won't know her schedule until this weekend, so she will call to schedule an appt with Heather at that time.

## 2021-05-13 ENCOUNTER — Other Ambulatory Visit: Payer: Self-pay

## 2021-05-13 ENCOUNTER — Other Ambulatory Visit: Payer: Self-pay | Admitting: Gerontology

## 2021-05-16 ENCOUNTER — Other Ambulatory Visit: Payer: Self-pay

## 2021-05-16 MED ORDER — PROPRANOLOL HCL 20 MG PO TABS
ORAL_TABLET | ORAL | 3 refills | Status: DC
  Filled 2021-05-16: qty 60, 30d supply, fill #0
  Filled 2021-06-30: qty 60, 30d supply, fill #1
  Filled 2021-07-28: qty 60, 30d supply, fill #2
  Filled 2021-08-24: qty 60, 30d supply, fill #3

## 2021-05-17 ENCOUNTER — Other Ambulatory Visit: Payer: Self-pay

## 2021-05-18 ENCOUNTER — Other Ambulatory Visit: Payer: Self-pay

## 2021-05-24 ENCOUNTER — Other Ambulatory Visit: Payer: Self-pay

## 2021-05-25 ENCOUNTER — Other Ambulatory Visit: Payer: Self-pay

## 2021-05-25 MED ORDER — TOPIRAMATE 50 MG PO TABS
ORAL_TABLET | ORAL | 0 refills | Status: DC
  Filled 2021-05-25: qty 90, 30d supply, fill #0

## 2021-05-31 ENCOUNTER — Other Ambulatory Visit: Payer: Self-pay

## 2021-06-02 ENCOUNTER — Other Ambulatory Visit: Payer: Self-pay

## 2021-06-02 ENCOUNTER — Other Ambulatory Visit: Payer: Self-pay | Admitting: Gerontology

## 2021-06-02 MED FILL — Ibuprofen Tab 800 MG: ORAL | 20 days supply | Qty: 60 | Fill #0 | Status: AC

## 2021-06-07 ENCOUNTER — Other Ambulatory Visit: Payer: Self-pay

## 2021-06-10 ENCOUNTER — Other Ambulatory Visit: Payer: Self-pay

## 2021-06-10 MED ORDER — BACLOFEN 10 MG PO TABS
ORAL_TABLET | ORAL | 0 refills | Status: DC
Start: 2021-06-10 — End: 2022-02-09
  Filled 2021-06-10: qty 30, 30d supply, fill #0
  Filled 2021-07-18: qty 30, 30d supply, fill #1
  Filled 2021-08-24: qty 30, 30d supply, fill #2

## 2021-06-16 ENCOUNTER — Ambulatory Visit: Payer: Self-pay | Admitting: Gerontology

## 2021-06-29 ENCOUNTER — Telehealth: Payer: Self-pay | Admitting: Emergency Medicine

## 2021-06-29 ENCOUNTER — Ambulatory Visit: Payer: Self-pay | Admitting: Gerontology

## 2021-06-29 ENCOUNTER — Other Ambulatory Visit: Payer: Self-pay

## 2021-06-29 NOTE — Telephone Encounter (Signed)
Patient called in today c/o productive cough, runny nose x 1 week, getting worse. Patient denies fever. She has been taking Alka Seltzer Plus without relief. Requesting antibiotics. Advised patient she would need to be evaluated by a provider. Since we do not have covid, flu, etc testing, recommended patient go to [facility] for evaluation.   Patient is scheduled for OV on 07/12/21. I advised that if she gets a negative PCR covid test that she could call and we could try to get her scheduled sooner.  Patient agreed and voiced understanding.

## 2021-06-30 ENCOUNTER — Ambulatory Visit
Admission: EM | Admit: 2021-06-30 | Discharge: 2021-06-30 | Disposition: A | Discharge status: Home / Self Care | Payer: Self-pay | Attending: Student | Admitting: Student

## 2021-06-30 ENCOUNTER — Ambulatory Visit: Payer: Self-pay

## 2021-06-30 ENCOUNTER — Ambulatory Visit (INDEPENDENT_AMBULATORY_CARE_PROVIDER_SITE_OTHER): Payer: Self-pay

## 2021-06-30 ENCOUNTER — Ambulatory Visit: Payer: Self-pay | Admitting: Licensed Clinical Social Worker

## 2021-06-30 ENCOUNTER — Other Ambulatory Visit: Payer: Self-pay

## 2021-06-30 ENCOUNTER — Encounter: Payer: Self-pay | Admitting: Emergency Medicine

## 2021-06-30 ENCOUNTER — Other Ambulatory Visit: Payer: Self-pay | Admitting: Gerontology

## 2021-06-30 DIAGNOSIS — R0602 Shortness of breath: Secondary | ICD-10-CM

## 2021-06-30 DIAGNOSIS — J441 Chronic obstructive pulmonary disease with (acute) exacerbation: Secondary | ICD-10-CM

## 2021-06-30 DIAGNOSIS — M792 Neuralgia and neuritis, unspecified: Secondary | ICD-10-CM

## 2021-06-30 MED ORDER — AMOXICILLIN-POT CLAVULANATE 875-125 MG PO TABS
1.0000 | ORAL_TABLET | Freq: Two times a day (BID) | ORAL | 0 refills | Status: DC
  Filled 2021-06-30: qty 14, 7d supply, fill #0

## 2021-06-30 MED ORDER — GABAPENTIN 600 MG PO TABS
600.0000 mg | ORAL_TABLET | Freq: Three times a day (TID) | ORAL | 1 refills | Status: DC
  Filled 2021-06-30: qty 90, 30d supply, fill #0
  Filled 2021-07-28: qty 90, 30d supply, fill #1
  Filled 2021-08-24: qty 90, 30d supply, fill #2
  Filled 2021-09-23: qty 90, 30d supply, fill #3
  Filled 2021-10-30: qty 90, 30d supply, fill #4
  Filled 2021-10-31: qty 90, 30d supply, fill #0
  Filled 2021-11-29: qty 90, 30d supply, fill #1

## 2021-06-30 MED ORDER — PREDNISONE 10 MG PO TABS
ORAL_TABLET | Freq: Every day | ORAL | 0 refills | Status: DC
  Filled 2021-06-30: qty 42, 12d supply, fill #0

## 2021-06-30 NOTE — ED Triage Notes (Signed)
Pt here with congestion and cough x 1 week. 2 days ago, she began having trouble breathing.

## 2021-06-30 NOTE — ED Provider Notes (Signed)
[name]    CSN: [id number] Arrival date & time: 06/30/21  0934      History   Chief Complaint Chief Complaint  Patient presents with   Cough   Shortness of Breath   Nasal Congestion    HPI Natasha Hanson is a 56 y.o. female presenting with congestion and cough x1 week, with worsening purulent sputum and new SOB x1 day. Medical history current smoker, COPD. Using albuterol inhaler "frequently". Cough is productive of thick yellow and green sputum. DOE. Denies fevers/chills, CP, dizziness, weakness. Denies known sick exposures.  HPI  Past Medical History:  Diagnosis Date   Allergy    Arthritis    COPD (chronic obstructive pulmonary disease) ([place])    Glaucoma    Tremors of nervous system    neck    Patient Active Problem List   Diagnosis Date Noted   Pain management contract broken (03/28/2021) 05/11/2021   Pain management contract terminated (05/11/2021) 05/11/2021   Swelling of both lower extremities 02/17/2021   Dry eye 02/17/2021   Chronic upper back pain 02/09/2021   Chronic midline thoracic back pain 02/09/2021   History of muscle spasm 02/02/2021   Sensation of fullness in ear, left 02/02/2021   Otitis externa of right ear 01/19/2021   Skin lesion of foot 01/19/2021   Pain and swelling of ankle, left 01/13/2021   History of depression 01/13/2021   Chronic knee pain (Left) 11/30/2020   Other intervertebral disc degeneration, lumbar region 12/01/2019   Arm numbness 10/08/2019   Neurogenic pain 10/07/2019   Chronic musculoskeletal pain 10/07/2019   Chronic sacroiliac joint pain (Bilateral) 09/17/2019   Somatic dysfunction of sacroiliac joints (Bilateral) 09/17/2019   Other spondylosis, sacral and sacrococcygeal region 09/17/2019   Spondylosis without myelopathy or radiculopathy, lumbosacral region 09/17/2019   Chronic hip pain (Bilateral) 09/17/2019   Lumbar facet hypertrophy (Multilevel) (Bilateral) 09/15/2019   Lumbar facet syndrome  (Bilateral) 09/15/2019   Osteoarthritis of knee (Left) 09/15/2019   Osteoarthritis of knee (Right) 09/15/2019   Osteoarthritis of the knees (Bilateral) 09/15/2019   Chronic pain syndrome 09/08/2019   Pharmacologic therapy 09/08/2019   Disorder of skeletal system 09/08/2019   Problems influencing health status 09/08/2019   Cervicalgia 09/08/2019   DDD (degenerative disc disease), cervical 09/08/2019   Cervical facet hypertrophy 09/08/2019   Cervical facet syndrome 09/08/2019   Cervical central spinal stenosis 09/08/2019   Abnormal MRI, cervical spine (02/20/2019) 09/08/2019   Cervical foraminal stenosis 09/08/2019   Chronic low back pain (1ry area of Pain) (Bilateral) (Midline) (L>R) w/o sciatica 09/08/2019   Chronic knee pain (2ry area of Pain) (Bilateral) (L>R) 09/08/2019   Chronic neck pain (3ry area of Pain) (Bilateral) (L>R) 09/08/2019   DDD (degenerative disc disease), lumbosacral 09/08/2019   Long term prescription opiate use 09/08/2019   Tremor 11/12/2018   Arthritis of knee 02/11/2015   Nicotine dependence, uncomplicated 0000000   Muscle cramps 02/03/2014    Past Surgical History:  Procedure Laterality Date   ABDOMINAL HYSTERECTOMY     FOOT FUSION     FOOT SURGERY     REFRACTIVE SURGERY Left     OB History   No obstetric history on file.      Home Medications    Prior to Admission medications   Medication Sig Start Date End Date Taking? Authorizing Provider  amoxicillin-clavulanate (AUGMENTIN) 875-125 MG tablet Take 1 tablet by mouth every 12 (twelve) hours. 06/30/21  Yes Hazel Sams, PA-C  predniSONE (STERAPRED UNI-PAK 21 TAB)  10 MG (21) TBPK tablet Take by mouth daily. Take 6 tabs by mouth daily  for 2 days, then 5 tabs for 2 days, then 4 tabs for 2 days, then 3 tabs for 2 days, 2 tabs for 2 days, then 1 tab by mouth daily for 2 days 06/30/21  Yes Phillip Heal, Sherlon Handing, PA-C  baclofen (LIORESAL) 10 MG tablet Take (1/2) tablet (5mg  total) by mouth twice daily.  06/10/21     cetirizine (ZYRTEC) 10 MG tablet Take 1 tablet (10 mg total) by mouth once daily. 01/13/21   Iloabachie, Chioma E, NP  DULoxetine (CYMBALTA) 30 MG capsule Take 2 capsules (60 mg total) by mouth once daily. 04/05/21   Iloabachie, Chioma E, NP  gabapentin (NEURONTIN) 600 MG tablet Take 1 tablet (600 mg total) by mouth 3 (three) times daily. 05/12/21 08/10/21  Iloabachie, Chioma E, NP  ibuprofen (ADVIL) 800 MG tablet Take 1 tablet (800 mg total) by mouth once every 8 (eight) hours as needed. 06/02/21   Iloabachie, Chioma E, NP  PROAIR HFA 108 (90 Base) MCG/ACT inhaler INHALE 2 TO 4 PUFFS BY MOUTH EVERY 4 HOURS AS NEEDED Patient taking differently: Needing two-three times daily during summer months 04/01/20 04/06/21  August Luz A, PA  propranolol (INDERAL) 20 MG tablet Take 1 tablet (20 mg total) by mouth Two (2) times a day. 05/14/21     Spacer/Aero-Holding Chambers (AEROCHAMBER PLUS) inhaler USE AS DIRECTED. 03/01/21   Ellie Lunch K, RPH  topiramate (TOPAMAX) 50 MG tablet TAKE ONE TABLET BY MOUTH 3 TIMES A DAY. 05/25/21   Iloabachie, Chioma E, NP  triamcinolone cream (KENALOG) 0.1 % Apply 1 application topically 2 (two) times daily. 01/19/21   Iloabachie, Chioma E, NP    Family History Family History  Problem Relation Age of Onset   Other Mother        unkown COD   Anxiety disorder Mother    Pneumonia Father    Atrial fibrillation Daughter    Other Maternal Grandmother        unkown medical history   Other Maternal Grandfather        unkown medical history   Other Paternal Grandmother        unkown medical history   Other Paternal Grandfather        unkown medical history    Social History Social History   Tobacco Use   Smoking status: Every Day    Packs/day: 1.00    Years: 40.00    Pack years: 40.00    Types: Cigarettes   Smokeless tobacco: Never  Vaping Use   Vaping Use: Never used  Substance Use Topics   Alcohol use: Yes    Comment: drinks occasionally    Drug use: Never     Allergies   No known allergies   Review of Systems Review of Systems  Constitutional:  Negative for appetite change, chills and fever.  HENT:  Positive for congestion. Negative for ear pain, rhinorrhea, sinus pressure, sinus pain and sore throat.   Eyes:  Negative for redness and visual disturbance.  Respiratory:  Positive for cough and shortness of breath. Negative for chest tightness and wheezing.   Cardiovascular:  Negative for chest pain and palpitations.  Gastrointestinal:  Negative for abdominal pain, constipation, diarrhea, nausea and vomiting.  Genitourinary:  Negative for dysuria, frequency and urgency.  Musculoskeletal:  Negative for myalgias.  Neurological:  Negative for dizziness, weakness and headaches.  Psychiatric/Behavioral:  Negative for confusion.   All other  systems reviewed and are negative.   Physical Exam Triage Vital Signs ED Triage Vitals  Enc Vitals Group     BP 06/30/21 0948 138/90     Pulse Rate 06/30/21 0948 80     Resp 06/30/21 0948 18     Temp 06/30/21 0948 99.3 F (37.4 C)     Temp Source 06/30/21 0948 Oral     SpO2 06/30/21 0948 93 %     Weight --      Height --      Head Circumference --      Peak Flow --      Pain Score 06/30/21 0952 0     Pain Loc --      Pain Edu? --      Excl. in [place]? --    No data found.  Updated Vital Signs BP 138/90 (BP Location: Left Arm)    Pulse 80    Temp 99.3 F (37.4 C) (Oral)    Resp 18    SpO2 93%   Visual Acuity Right Eye Distance:   Left Eye Distance:   Bilateral Distance:    Right Eye Near:   Left Eye Near:    Bilateral Near:     Physical Exam Vitals reviewed.  Constitutional:      General: She is not in acute distress.    Appearance: Normal appearance. She is not ill-appearing.  HENT:     Head: Normocephalic and atraumatic.     Right Ear: Tympanic membrane, ear canal and external ear normal. No tenderness. No middle ear effusion. There is no impacted cerumen.  Tympanic membrane is not perforated, erythematous, retracted or bulging.     Left Ear: Tympanic membrane, ear canal and external ear normal. No tenderness.  No middle ear effusion. There is no impacted cerumen. Tympanic membrane is not perforated, erythematous, retracted or bulging.     Nose: Nose normal. No congestion.     Mouth/Throat:     Mouth: Mucous membranes are moist.     Pharynx: Uvula midline. No oropharyngeal exudate or posterior oropharyngeal erythema.  Eyes:     Extraocular Movements: Extraocular movements intact.     Pupils: Pupils are equal, round, and reactive to light.  Cardiovascular:     Rate and Rhythm: Normal rate and regular rhythm.     Heart sounds: Normal heart sounds.  Pulmonary:     Effort: Pulmonary effort is normal.     Breath sounds: Rhonchi present. No decreased breath sounds, wheezing or rales.     Comments: Rhonchi throughout  Abdominal:     Palpations: Abdomen is soft.     Tenderness: There is no abdominal tenderness. There is no guarding or rebound.  Lymphadenopathy:     Cervical: No cervical adenopathy.     Right cervical: No superficial cervical adenopathy.    Left cervical: No superficial cervical adenopathy.  Neurological:     General: No focal deficit present.     Mental Status: She is alert and oriented to person, place, and time.  Psychiatric:        Mood and Affect: Mood normal.        Behavior: Behavior normal.        Thought Content: Thought content normal.        Judgment: Judgment normal.     UC Treatments / Results  Labs (all labs ordered are listed, but only abnormal results are displayed) Labs Reviewed - No data to display  EKG   Radiology DG Chest  2 View  Result Date: 06/30/2021 CLINICAL DATA:  Shortness of breath EXAM: CHEST - 2 VIEW COMPARISON:  Chest radiograph 07/21/2020 FINDINGS: The cardiomediastinal silhouette is normal. The lungs are markedly hyperinflated and hyperlucent consistent with underlying COPD. There is  no focal consolidation or pulmonary edema. There is no pleural effusion or pneumothorax. There is no acute osseous abnormality. IMPRESSION: COPD.  No radiographic evidence of acute cardiopulmonary process. Electronically Signed   By: Valetta Mole M.D.   On: 06/30/2021 09:52    Procedures Procedures (including critical care time)  Medications Ordered in UC Medications - No data to display  Initial Impression / Assessment and Plan / UC Course  I have reviewed the triage vital signs and the nursing notes.  Pertinent labs & imaging results that were available during my care of the patient were reviewed by me and considered in my medical decision making (see chart for details).     This patient is a very pleasant 56 y.o. year old female presenting with COPD exacerbation following virus. Afebrile, nontachy. Rhonchi throughout.  Current smoker. Diagnosis of COPD.  CXR - COPD.  No radiographic evidence of acute cardiopulmonary process. Did not send covid or influenza testing given duration symptoms >7 days.   Augmentin, prednisone taper. Continue albuterol inhaler.   ED return precautions discussed. Patient verbalizes understanding and agreement.   Coding Level 4 for acute exacerbation of chronic condition, and prescription drug management   Final Clinical Impressions(s) / UC Diagnoses   Final diagnoses:  COPD exacerbation ([place])     Discharge Instructions      -Start the antibiotic-Augmentin (amoxicillin-clavulanate), 1 pill every 12 hours for 7 days.  You can take this with food like with breakfast and dinner. -Prednisone taper for cough/bronchitis. I recommend taking this in the morning as it could give you energy.  Avoid NSAIDs like ibuprofen and alleve while taking this medication as they can increase your risk of stomach upset and even GI bleeding when in combination with a steroid. You can continue tylenol (acetaminophen) up to 1000mg  3x daily. -Follow-up if symptoms getting  worse instead of better- shortness of breath, chest pain,fevers, etc.      ED Prescriptions     Medication Sig Dispense Auth. Provider   amoxicillin-clavulanate (AUGMENTIN) 875-125 MG tablet Take 1 tablet by mouth every 12 (twelve) hours. 14 tablet Hazel Sams, PA-C   predniSONE (STERAPRED UNI-PAK 21 TAB) 10 MG (21) TBPK tablet Take by mouth daily. Take 6 tabs by mouth daily  for 2 days, then 5 tabs for 2 days, then 4 tabs for 2 days, then 3 tabs for 2 days, 2 tabs for 2 days, then 1 tab by mouth daily for 2 days 42 tablet Hazel Sams, PA-C      PDMP not reviewed this encounter.   Hazel Sams, PA-C 06/30/21 1013

## 2021-06-30 NOTE — Discharge Instructions (Addendum)
-  Start the antibiotic-Augmentin (amoxicillin-clavulanate), 1 pill every 12 hours for 7 days.  You can take this with food like with breakfast and dinner. -Prednisone taper for cough/bronchitis. I recommend taking this in the morning as it could give you energy.  Avoid NSAIDs like ibuprofen and alleve while taking this medication as they can increase your risk of stomach upset and even GI bleeding when in combination with a steroid. You can continue tylenol (acetaminophen) up to 1000mg  3x daily. -Follow-up if symptoms getting worse instead of better- shortness of breath, chest pain,fevers, etc.

## 2021-07-01 ENCOUNTER — Other Ambulatory Visit: Payer: Self-pay

## 2021-07-04 ENCOUNTER — Other Ambulatory Visit: Payer: Self-pay

## 2021-07-04 ENCOUNTER — Other Ambulatory Visit: Payer: Self-pay | Admitting: Gerontology

## 2021-07-04 MED ORDER — FLUTICASONE-SALMETEROL 100-50 MCG/ACT IN AEPB
INHALATION_SPRAY | RESPIRATORY_TRACT | 3 refills | Status: DC
  Filled 2021-07-04: qty 60, 30d supply, fill #0
  Filled 2021-08-04: qty 60, 30d supply, fill #1

## 2021-07-04 MED ORDER — NEBULIZER MISC: 0 refills | Status: DC

## 2021-07-04 MED ORDER — NEBULIZER DEVI: 0 refills | Status: DC

## 2021-07-04 MED ORDER — ALBUTEROL SULFATE 1.25 MG/3ML IN NEBU
INHALATION_SOLUTION | RESPIRATORY_TRACT | 3 refills | Status: DC
  Filled 2021-07-04: qty 90, 8d supply, fill #0
  Filled 2021-08-24: qty 90, 8d supply, fill #1
  Filled 2021-09-05: qty 90, 8d supply, fill #2

## 2021-07-04 MED ORDER — ADULT DISPOSABLE MOUTHPIECE MISC: 0 refills | Status: DC

## 2021-07-05 ENCOUNTER — Other Ambulatory Visit: Payer: Self-pay

## 2021-07-05 MED FILL — Topiramate Tab 50 MG: ORAL | 30 days supply | Qty: 90 | Fill #0 | Status: AC

## 2021-07-06 ENCOUNTER — Telehealth: Payer: Self-pay | Admitting: Licensed Clinical Social Worker

## 2021-07-06 ENCOUNTER — Ambulatory Visit: Payer: Self-pay | Admitting: Licensed Clinical Social Worker

## 2021-07-06 NOTE — Telephone Encounter (Signed)
Called the patient twice during today's scheduled appointment; no answer, unable to leave a voicemail  

## 2021-07-07 ENCOUNTER — Other Ambulatory Visit: Payer: Self-pay

## 2021-07-12 ENCOUNTER — Ambulatory Visit: Payer: Self-pay | Admitting: Gerontology

## 2021-07-14 ENCOUNTER — Telehealth: Payer: Self-pay | Admitting: Pharmacist

## 2021-07-14 NOTE — Telephone Encounter (Signed)
07/14/2021 10:41:24 AM - Advair forms to patient & Dr -- Arletha Pili - Thursday, July 14, 2021 10:37 AM --  Received printout from pharmacy for Advair 100-50. Forms mailed to patient and doctor for signature. Also mailed [phone] recertification application to patient requesting POI.

## 2021-07-18 ENCOUNTER — Other Ambulatory Visit: Payer: Self-pay | Admitting: Gerontology

## 2021-07-18 ENCOUNTER — Other Ambulatory Visit: Payer: Self-pay

## 2021-07-18 DIAGNOSIS — T7840XD Allergy, unspecified, subsequent encounter: Secondary | ICD-10-CM

## 2021-07-19 ENCOUNTER — Other Ambulatory Visit: Payer: Self-pay

## 2021-07-19 MED FILL — Cetirizine HCl Tab 10 MG: ORAL | 30 days supply | Qty: 30 | Fill #0 | Status: AC

## 2021-07-27 ENCOUNTER — Other Ambulatory Visit: Payer: Self-pay

## 2021-07-28 ENCOUNTER — Other Ambulatory Visit: Payer: Self-pay

## 2021-08-04 ENCOUNTER — Other Ambulatory Visit: Payer: Self-pay

## 2021-08-04 ENCOUNTER — Ambulatory Visit: Payer: Self-pay | Admitting: Gerontology

## 2021-08-04 ENCOUNTER — Encounter: Payer: Self-pay | Admitting: Gerontology

## 2021-08-04 ENCOUNTER — Telehealth: Payer: Self-pay

## 2021-08-04 VITALS — BP 122/81 | HR 60 | Temp 98.8°F | Resp 18 | Ht 63.0 in | Wt 125.7 lb

## 2021-08-04 DIAGNOSIS — G8929 Other chronic pain: Secondary | ICD-10-CM

## 2021-08-04 DIAGNOSIS — R0989 Other specified symptoms and signs involving the circulatory and respiratory systems: Secondary | ICD-10-CM | POA: Insufficient documentation

## 2021-08-04 DIAGNOSIS — R0981 Nasal congestion: Secondary | ICD-10-CM | POA: Insufficient documentation

## 2021-08-04 DIAGNOSIS — R059 Cough, unspecified: Secondary | ICD-10-CM | POA: Insufficient documentation

## 2021-08-04 MED ORDER — IBUPROFEN 600 MG PO TABS
600.0000 mg | ORAL_TABLET | Freq: Every day | ORAL | 0 refills | Status: DC
  Filled 2021-08-04: qty 14, 14d supply, fill #0

## 2021-08-04 MED ORDER — SALINE SPRAY 0.65 % NA SOLN
1.0000 | NASAL | 0 refills | Status: DC | PRN
  Filled 2021-08-04: qty 30, fill #0

## 2021-08-04 MED ORDER — FLUTICASONE PROPIONATE 50 MCG/ACT NA SUSP
2.0000 | Freq: Every day | NASAL | 0 refills | Status: DC
  Filled 2021-08-04: qty 16, 30d supply, fill #0

## 2021-08-04 MED ORDER — BENZONATATE 100 MG PO CAPS
100.0000 mg | ORAL_CAPSULE | Freq: Three times a day (TID) | ORAL | 0 refills | Status: DC | PRN
  Filled 2021-08-04: qty 30, 10d supply, fill #0
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
## 2021-08-04 NOTE — Telephone Encounter (Signed)
Scheduled for 10/27/2021 °

## 2021-08-04 NOTE — Patient Instructions (Signed)

## 2021-08-10 ENCOUNTER — Ambulatory Visit: Payer: Self-pay | Admitting: Licensed Clinical Social Worker

## 2021-08-10 ENCOUNTER — Other Ambulatory Visit: Payer: Self-pay

## 2021-08-10 DIAGNOSIS — F411 Generalized anxiety disorder: Secondary | ICD-10-CM

## 2021-08-10 DIAGNOSIS — F339 Major depressive disorder, recurrent, unspecified: Secondary | ICD-10-CM
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
## 2021-08-11 ENCOUNTER — Other Ambulatory Visit: Payer: Self-pay

## 2021-08-24 ENCOUNTER — Other Ambulatory Visit: Payer: Self-pay

## 2021-08-24 ENCOUNTER — Other Ambulatory Visit: Payer: Self-pay | Admitting: Gerontology

## 2021-08-24 MED FILL — Topiramate Tab 50 MG: ORAL | 30 days supply | Qty: 90 | Fill #0 | Status: AC

## 2021-08-24 MED FILL — Cetirizine HCl Tab 10 MG: ORAL | 30 days supply | Qty: 30 | Fill #1 | Status: AC

## 2021-08-26 ENCOUNTER — Telehealth: Payer: Self-pay | Admitting: Pharmacist

## 2021-08-26 NOTE — Telephone Encounter (Signed)
08/26/2021 12:19:46 PM - Advair pending ?-- Arletha Pili - Friday, August 26, 2021 12:18 PM --  ?Received dr signed portion for Advair. Holding for pt sign portion, POI & taxes ?

## 2021-09-01 ENCOUNTER — Ambulatory Visit: Payer: Self-pay | Admitting: Gerontology

## 2021-09-01 ENCOUNTER — Other Ambulatory Visit: Payer: Self-pay

## 2021-09-01 ENCOUNTER — Telehealth: Payer: Self-pay | Admitting: Emergency Medicine

## 2021-09-01 DIAGNOSIS — R0981 Nasal congestion: Secondary | ICD-10-CM

## 2021-09-01 MED ORDER — FLUTICASONE PROPIONATE 50 MCG/ACT NA SUSP
2.0000 | Freq: Every day | NASAL | 0 refills | Status: DC
  Filled 2021-09-01: qty 16, 30d supply, fill #0

## 2021-09-01 NOTE — Telephone Encounter (Signed)
Patient called stating she needed to cancel her appointment for today due to oversleeping, but needs a refill on Flonase. Advised we would refill x 1, but she would need to reschedule her appointment from today. Patient agreed and voiced understanding. She will call back to RS. ?

## 2021-09-05 ENCOUNTER — Other Ambulatory Visit: Payer: Self-pay

## 2021-09-06 ENCOUNTER — Other Ambulatory Visit: Payer: Self-pay | Admitting: Gerontology

## 2021-09-06 ENCOUNTER — Other Ambulatory Visit: Payer: Self-pay

## 2021-09-06 DIAGNOSIS — R059 Cough, unspecified: Secondary | ICD-10-CM

## 2021-09-06 DIAGNOSIS — R0981 Nasal congestion: Secondary | ICD-10-CM

## 2021-09-07 ENCOUNTER — Other Ambulatory Visit: Payer: Self-pay

## 2021-09-07 MED ORDER — PROPRANOLOL HCL 20 MG PO TABS
ORAL_TABLET | Freq: Two times a day (BID) | ORAL | 3 refills | Status: DC
  Filled 2021-10-19 – 2021-11-18 (×2): qty 60, 30d supply, fill #0
  Filled 2021-12-14: qty 60, 30d supply, fill #1

## 2021-09-08 ENCOUNTER — Other Ambulatory Visit: Payer: Self-pay

## 2021-09-21 ENCOUNTER — Other Ambulatory Visit: Payer: Self-pay

## 2021-09-22 ENCOUNTER — Other Ambulatory Visit: Payer: Self-pay

## 2021-09-23 ENCOUNTER — Other Ambulatory Visit: Payer: Self-pay

## 2021-09-23 MED ORDER — FLUTICASONE-SALMETEROL 100-50 MCG/ACT IN AEPB
1.0000 | INHALATION_SPRAY | Freq: Two times a day (BID) | RESPIRATORY_TRACT | 0 refills | Status: DC
  Filled 2021-09-23: qty 60, 30d supply, fill #0
  Filled 2021-09-23: qty 60, fill #0

## 2021-09-23 MED FILL — Cetirizine HCl Tab 10 MG: ORAL | 30 days supply | Qty: 30 | Fill #2 | Status: AC

## 2021-09-29 ENCOUNTER — Ambulatory Visit: Payer: Self-pay

## 2021-09-29 ENCOUNTER — Encounter: Payer: Self-pay | Admitting: Neurology

## 2021-09-29 ENCOUNTER — Ambulatory Visit: Payer: Self-pay | Admitting: Neurology

## 2021-10-12 ENCOUNTER — Other Ambulatory Visit: Payer: Self-pay

## 2021-10-12 ENCOUNTER — Other Ambulatory Visit: Payer: Self-pay | Admitting: Gerontology

## 2021-10-12 DIAGNOSIS — R0981 Nasal congestion: Secondary | ICD-10-CM

## 2021-10-12 DIAGNOSIS — Z8659 Personal history of other mental and behavioral disorders: Secondary | ICD-10-CM

## 2021-10-13 ENCOUNTER — Other Ambulatory Visit: Payer: Self-pay

## 2021-10-14 ENCOUNTER — Other Ambulatory Visit: Payer: Self-pay

## 2021-10-18 ENCOUNTER — Other Ambulatory Visit: Payer: Self-pay

## 2021-10-18 ENCOUNTER — Ambulatory Visit: Payer: Self-pay | Admitting: Gerontology

## 2021-10-18 ENCOUNTER — Encounter: Payer: Self-pay | Admitting: Gerontology

## 2021-10-18 VITALS — BP 109/70 | HR 63 | Temp 98.4°F | Resp 16 | Ht 63.0 in | Wt 126.1 lb

## 2021-10-18 DIAGNOSIS — H938X2 Other specified disorders of left ear: Secondary | ICD-10-CM

## 2021-10-18 DIAGNOSIS — R251 Tremor, unspecified: Secondary | ICD-10-CM

## 2021-10-18 DIAGNOSIS — H6093 Unspecified otitis externa, bilateral: Secondary | ICD-10-CM

## 2021-10-18 DIAGNOSIS — R0989 Other specified symptoms and signs involving the circulatory and respiratory systems: Secondary | ICD-10-CM

## 2021-10-18 DIAGNOSIS — R0981 Nasal congestion: Secondary | ICD-10-CM

## 2021-10-18 HISTORY — DX: Unspecified otitis externa, bilateral: H60.93

## 2021-10-18 MED ORDER — AMOXICILLIN-POT CLAVULANATE 875-125 MG PO TABS
1.0000 | ORAL_TABLET | Freq: Two times a day (BID) | ORAL | 0 refills | Status: DC
  Filled 2021-10-18: qty 14, 7d supply, fill #0

## 2021-10-18 MED ORDER — FLUTICASONE-SALMETEROL 100-50 MCG/ACT IN AEPB
1.0000 | INHALATION_SPRAY | Freq: Two times a day (BID) | RESPIRATORY_TRACT | 0 refills | Status: DC
  Filled 2021-10-18 – 2021-10-21 (×2): qty 60, 30d supply, fill #0

## 2021-10-18 MED ORDER — FLUTICASONE PROPIONATE 50 MCG/ACT NA SUSP
2.0000 | Freq: Every day | NASAL | 0 refills | Status: DC
  Filled 2021-10-18: qty 16, 30d supply, fill #0

## 2021-10-18 MED ORDER — TOPIRAMATE 50 MG PO TABS
50.0000 mg | ORAL_TABLET | Freq: Two times a day (BID) | ORAL | 3 refills | Status: DC
  Filled 2021-10-18 – 2021-11-14 (×2): qty 60, 30d supply, fill #0
  Filled 2021-12-14: qty 60, 30d supply, fill #1
  Filled 2022-01-09: qty 60, 30d supply, fill #2

## 2021-10-18 NOTE — Progress Notes (Signed)
? ?Established Patient Office Visit ? ?Subjective   ?Patient ID: Natasha Hanson, female    DOB: 23-Dec-1965  Age: 56 y.o. MRN: [id number] ? ?Chief Complaint  ?Patient presents with  ? Medication Refill  ? Follow-up  ?  Patient requesting referral to ENT  ? ? ?HPI ? ?Natasha Hanson  is a 56 y/o female who has history of Allergy, Arthritis, COPD, Tremors of nervous system neck presents  for follow up visit and medication refill. She c/o chronic stuffed up bilateral ear and chronic nasal congestion. She states that her symptoms are associated with sinus pressure, and mastoid pain. She denies discharge from ear, vertigo, rhinorrhea, tinnitus, fever and chills. She also c/o globus sensation that's chronic when she eats and drinks, but she denies dysphagia, odynophagia. She also has a history of voice and peripheral tremor and continues to take Topamax 50 mg bid and propranolol 20 mg bid which moderately relieves symptoms, but did not follow up with Neurology at [facility]. Overall, she states that she's doing well and offers no further complaint. ? ?ROS ? ?  ?Objective:  ?  ? ?BP 109/70 (BP Location: Right Arm, Patient Position: Sitting, Cuff Size: Normal)   Pulse 63   Temp 98.4 ?F (36.9 ?C) (Oral)   Resp 16   Ht _0  (1.6 m)   Wt 126 lb 1.6 oz (57.2 kg)   SpO2 96%   BMI 22.34 kg/m?  ?BP Readings from Last 3 Encounters:  ?10/18/21 109/70  ?08/04/21 122/81  ?06/30/21 138/90  ? ?Wt Readings from Last 3 Encounters:  ?10/18/21 126 lb 1.6 oz (57.2 kg)  ?08/04/21 125 lb 11.2 oz (57 kg)  ?05/12/21 125 lb 6.4 oz (56.9 kg)  ? ?  ? ?Physical Exam ?HENT:  ?   Head: Normocephalic and atraumatic.  ?   Right Ear: Hearing normal. No drainage. A middle ear effusion is present. There is no impacted cerumen. There is mastoid tenderness. Tympanic membrane is erythematous.  ?   Left Ear: Hearing normal. No drainage. A middle ear effusion is present. There is no impacted cerumen. There is mastoid tenderness.  ?   Mouth/Throat:  ?   Mouth:  Mucous membranes are moist.  ?Eyes:  ?   Extraocular Movements: Extraocular movements intact.  ?   Conjunctiva/sclera: Conjunctivae normal.  ?   Pupils: Pupils are equal, round, and reactive to light.  ?Cardiovascular:  ?   Rate and Rhythm: Normal rate and regular rhythm.  ?   Pulses: Normal pulses.  ?   Heart sounds: Normal heart sounds.  ?Pulmonary:  ?   Effort: Pulmonary effort is normal.  ?   Breath sounds: Normal breath sounds.  ?Skin: ?   General: Skin is warm.  ?Neurological:  ?   General: No focal deficit present.  ?   Mental Status: She is oriented to person, place, and time. Mental status is at baseline.  ?Psychiatric:     ?   Mood and Affect: Mood normal.     ?   Behavior: Behavior normal.     ?   Thought Content: Thought content normal.     ?   Judgment: Judgment normal.  ? ? ? ?No results found for any visits on 10/18/21. ? ?Last CBC ?Lab Results  ?Component Value Date  ? WBC 11.5 (H) 12/30/2019  ? HGB 14.7 12/30/2019  ? HCT 42.4 12/30/2019  ? MCV 98.4 12/30/2019  ? MCH 34.1 (H) 12/30/2019  ? RDW 13.2 12/30/2019  ?  PLT 380 12/30/2019  ? ?Last metabolic panel ?Lab Results  ?Component Value Date  ? GLUCOSE 75 01/19/2021  ? NA 141 01/19/2021  ? K 4.8 01/19/2021  ? CL 109 (H) 01/19/2021  ? CO2 19 (L) 01/19/2021  ? BUN 10 01/19/2021  ? CREATININE 0.83 01/19/2021  ? EGFR 83 01/19/2021  ? CALCIUM 9.3 01/19/2021  ? PROT 6.3 01/19/2021  ? ALBUMIN 3.9 01/19/2021  ? LABGLOB 2.4 01/19/2021  ? AGRATIO 1.6 01/19/2021  ? BILITOT 0.4 01/19/2021  ? ALKPHOS 69 01/19/2021  ? AST 13 01/19/2021  ? ALT 6 01/19/2021  ? ANIONGAP 8 12/30/2019  ? ?Last lipids ?No results found for: CHOL, HDL, LDLCALC, LDLDIRECT, TRIG, CHOLHDL ?Last hemoglobin A1c ?No results found for: HGBA1C ?Last thyroid functions ?Lab Results  ?Component Value Date  ? TSH 1.470 01/19/2021  ? ?  ? ?The ASCVD Risk score (Arnett DK, et al., 2019) failed to calculate for the following reasons: ?  Cannot find a previous HDL lab ?  Cannot find a previous total  cholesterol lab ? ?  ?Assessment & Plan:  ?1. Nasal congestion ?- She was started on Flonase, will follow up at [facility], was advised to complete [facility] financial application. ?- fluticasone-salmeterol (WIXELA INHUB) 100-50 MCG/ACT AEPB; Inhale 1 puff into the lungs 2 (two) times daily.  Dispense: 60 each; Refill: 0 ?- Ambulatory referral to ENT ?- fluticasone (FLONASE) 50 MCG/ACT nasal spray; Spray 2 sprays into both nostrils once daily.  Dispense: 16 g; Refill: 0 ? ?2. Tremor ?- Her tremor is moderately controlled with taking Topamax, she will follow up at [facility]. ?- topiramate (TOPAMAX) 50 MG tablet; Take 1 tablet (50 mg total) by mouth 2 (two) times daily.  Dispense: 60 tablet; Refill: 3 ?- Ambulatory referral to Neurosurgery ? ?-  ?3. Globus sensation ?- She will follow up with [facility]. ?- Ambulatory referral to ENT ? ?4. Otitis externa of both ears, unspecified chronicity, unspecified type ?- She has mid ear effusion, mastoid tenderness and erythematous TM on the right. She was started on Augmentin, educated on medication side effects and advised to notify clinic. ?- amoxicillin-clavulanate (AUGMENTIN) 875-125 MG tablet; Take 1 tablet by mouth 2 (two) times daily for 7 days.  Dispense: 14 tablet; Refill: 0 ?-Ambulatory referral to ENT ? ? ?Return in about 6 weeks (around 11/29/2021), or if symptoms worsen or fail to improve.  ? ? ?Jaycie Kregel Jerold Coombe, NP ? ?

## 2021-10-19 ENCOUNTER — Other Ambulatory Visit: Payer: Self-pay

## 2021-10-20 ENCOUNTER — Other Ambulatory Visit: Payer: Self-pay

## 2021-10-21 ENCOUNTER — Other Ambulatory Visit: Payer: Self-pay

## 2021-10-24 ENCOUNTER — Other Ambulatory Visit: Payer: Self-pay

## 2021-10-25 ENCOUNTER — Other Ambulatory Visit: Payer: Self-pay

## 2021-10-27 ENCOUNTER — Ambulatory Visit: Payer: Self-pay | Admitting: Gastroenterology

## 2021-10-27 ENCOUNTER — Encounter: Payer: Self-pay | Admitting: Gastroenterology

## 2021-10-27 NOTE — Progress Notes (Deleted)
Wyline Mood MD, MRCP(U.K) [address]  [address]  [place], [place] [address]  Main: [phone]  Fax: [phone]   Gastroenterology Consultation  Referring Provider:     Rolm Gala, NP Primary Care Physician:  Rolm Gala, NP Primary Gastroenterologist:  Dr. Wyline Mood  Reason for Consultation:   colonoscopy and globus sensation        HPI:   Natasha Hanson is a 56 y.o. y/o female referred for consultation & management  by  Rolm Gala, NP.      Past Medical History:  Diagnosis Date   Allergy    Arthritis    COPD (chronic obstructive pulmonary disease) (HCC)    Glaucoma    Tremors of nervous system    neck    Past Surgical History:  Procedure Laterality Date   ABDOMINAL HYSTERECTOMY     FOOT FUSION     FOOT SURGERY     REFRACTIVE SURGERY Left     Prior to Admission medications   Medication Sig Start Date End Date Taking? Authorizing Provider  albuterol (ACCUNEB) 1.25 MG/3ML nebulizer solution INHALE 1 VIAL ( TOTAL) VIA NEBULIZER  4 TIMES DAILY FOR SHORTNESS OF BREATH. 07/04/21     amoxicillin-clavulanate (AUGMENTIN) 875-125 MG tablet Take 1 tablet by mouth 2 (two) times daily for 7 days. 10/18/21   Iloabachie, Chioma E, NP  baclofen (LIORESAL) 10 MG tablet Take (1/2) tablet (5mg  total) by mouth twice daily. 06/10/21     cetirizine (ZYRTEC) 10 MG tablet Take 1 tablet (10 mg total) by mouth once daily. 07/19/21   Iloabachie, Chioma E, NP  fluticasone (FLONASE) 50 MCG/ACT nasal spray Spray 2 sprays into both nostrils once daily. 10/18/21   Iloabachie, Chioma E, NP  fluticasone-salmeterol (WIXELA INHUB) 100-50 MCG/ACT AEPB Inhale 1 puff into the lungs 2 (two) times daily. 10/18/21   Iloabachie, Chioma E, NP  gabapentin (NEURONTIN) 600 MG tablet Take 1 tablet (600 mg total) by mouth 3 (three) times daily. 06/30/21     ibuprofen (ADVIL) 600 MG tablet Take 1 tablet (600 mg total) by mouth once daily. Patient not taking: Reported on 10/18/2021  08/04/21   08/06/21 E, NP  Nebulizer MISC Use the nebulizer with tubing four times a day 07/04/21   07/06/21 A, PA  propranolol (INDERAL) 20 MG tablet Take 1 tablet (20 mg total) by mouth Two (2) times a day. 09/07/21     Respiratory Therapy Supplies (ADULT DISPOSABLE MOUTHPIECE) Mouthpiece MISC Use as directed with the nebulizer machine 07/04/21     Respiratory Therapy Supplies (NEBULIZER) DEVI USE AS DIRECTED WITH TUBING 4 TIMES DAILY. 07/04/21     sodium chloride (OCEAN) 0.65 % SOLN nasal spray Place 1 spray into both nostrils daily as needed for congestion. 08/04/21   Iloabachie, Chioma E, NP  Spacer/Aero-Holding Chambers (AEROCHAMBER PLUS) inhaler USE AS DIRECTED. 03/01/21   03/03/21, RPH  topiramate (TOPAMAX) 50 MG tablet Take 1 tablet (50 mg total) by mouth 2 (two) times daily. 10/18/21   Iloabachie, Chioma E, NP    Family History  Problem Relation Age of Onset   Other Mother        unkown COD   Anxiety disorder Mother    Pneumonia Father    Atrial fibrillation Daughter    Other Maternal Grandmother        unkown medical history   Other Maternal Grandfather        unkown medical history   Other  Paternal Grandmother        unkown medical history   Other Paternal Grandfather        unkown medical history     Social History   Tobacco Use   Smoking status: Every Day    Packs/day: 1.00    Years: 40.00    Pack years: 40.00    Types: Cigarettes   Smokeless tobacco: Never  Vaping Use   Vaping Use: Never used  Substance Use Topics   Alcohol use: Yes    Comment: rarely   Drug use: Never    Allergies as of 10/27/2021 - Review Complete 10/18/2021  Allergen Reaction Noted   No known allergies  01/13/2021    Review of Systems:    All systems reviewed and negative except where noted in HPI.   Physical Exam:  There were no vitals taken for this visit. No LMP recorded. Patient has had a hysterectomy. Psych:  Alert and cooperative. Normal mood and  affect. General:   Alert,  Well-developed, well-nourished, pleasant and cooperative in NAD Head:  Normocephalic and atraumatic. Eyes:  Sclera clear, no icterus.   Conjunctiva pink. Ears:  Normal auditory acuity. Neck:  Supple; no masses or thyromegaly. Lungs:  Respirations even and unlabored.  Clear throughout to auscultation.   No wheezes, crackles, or rhonchi. No acute distress. Heart:  Regular rate and rhythm; no murmurs, clicks, rubs, or gallops. Abdomen:  Normal bowel sounds.  No bruits.  Soft, non-tender and non-distended without masses, hepatosplenomegaly or hernias noted.  No guarding or rebound tenderness.    Neurologic:  Alert and oriented x3;  grossly normal neurologically. Psych:  Alert and cooperative. Normal mood and affect.  Imaging Studies: No results found.  Assessment and Plan:   Natasha Hanson is a 56 y.o. y/o female has been referred for ***  Follow up in ***  Dr Wyline Mood MD,MRCP(U.K)

## 2021-10-31 ENCOUNTER — Other Ambulatory Visit: Payer: Self-pay

## 2021-11-10 ENCOUNTER — Ambulatory Visit: Payer: Self-pay | Admitting: Gerontology

## 2021-11-10 VITALS — BP 114/75 | HR 52 | Temp 98.2°F | Resp 16 | Ht 64.0 in | Wt 125.5 lb

## 2021-11-10 DIAGNOSIS — J3489 Other specified disorders of nose and nasal sinuses: Secondary | ICD-10-CM

## 2021-11-10 DIAGNOSIS — R5383 Other fatigue: Secondary | ICD-10-CM | POA: Insufficient documentation

## 2021-11-10 DIAGNOSIS — R3915 Urgency of urination: Secondary | ICD-10-CM | POA: Insufficient documentation

## 2021-11-10 DIAGNOSIS — R0981 Nasal congestion: Secondary | ICD-10-CM

## 2021-11-10 DIAGNOSIS — Z0000 Encounter for general adult medical examination without abnormal findings: Secondary | ICD-10-CM

## 2021-11-10 LAB — POCT URINALYSIS DIPSTICK

## 2021-11-10 MED ORDER — FLUTICASONE PROPIONATE 50 MCG/ACT NA SUSP
2.0000 | Freq: Every day | NASAL | 0 refills | Status: DC
  Filled 2021-11-10: qty 16, 30d supply, fill #0

## 2021-11-10 MED ORDER — SALINE SPRAY 0.65 % NA SOLN
1.0000 | NASAL | 0 refills | Status: DC | PRN
  Filled 2021-11-10: qty 30, fill #0

## 2021-11-10 NOTE — Progress Notes (Signed)
Established Patient Office Visit  Subjective   Patient ID: Natasha Hanson, female    DOB: 1966/03/08  Age: 56 y.o. MRN: [id number]  Chief Complaint  Patient presents with   Nasal Congestion    Ear fullness, sore throat, post nasal drip. Symptoms started 3 days ago.    Dysuria    Patient believes she has a UTI. Burning with urination for 2 days.     HPI  Natasha Hanson  is a 56 y/o female who has history of Allergy, Arthritis, COPD, Tremors of nervous system neck presents for c/o urinary urgency, frequency, dysuria, but denies flank, pelvic pain, fever and chills. She reports that symptoms has been going on for 2 days.  She also c/o nasal congestion, rhinorrhea, sinus pressure, post nasal drip , sore throat that has been going on for 4 days. She denies productive cough and shortness of breath. 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 She equally states that she feels tired and cold all the time. Overall, she states that she's doing well and offers no further complaint.  Review of Systems  Constitutional: Negative.   HENT:  Positive for sinus pain and sore throat.   Respiratory: Negative.    Cardiovascular: Negative.   Genitourinary:  Positive for dysuria, frequency and urgency. Negative for flank pain.  Skin: Negative.   Neurological: Negative.      Objective:     BP 114/75 (BP Location: Left Arm, Patient Position: Sitting, Cuff Size: Normal)   Pulse (!) 52   Temp 98.2 F (36.8 C) (Oral)   Resp 16   Ht 5' 4"  (1.626 m)   Wt 125 lb 8 oz (56.9 kg)   SpO2 97%   BMI 21.54 kg/m  BP Readings from Last 3 Encounters:  11/10/21 114/75  10/18/21 109/70  08/04/21 122/81   Wt Readings from Last 3 Encounters:  11/10/21 125 lb 8 oz (56.9 kg)  10/18/21 126 lb 1.6 oz (57.2 kg)  08/04/21 125 lb 11.2 oz (57 kg)      Physical Exam HENT:     Right Ear: Ear canal and external ear normal. No drainage. Tympanic membrane is not erythematous.     Left  Ear: Ear canal and external ear normal. No drainage. Tympanic membrane is not erythematous.     Nose: No septal deviation, nasal tenderness, mucosal edema or rhinorrhea.     Right Turbinates: Not enlarged, swollen or pale.     Left Turbinates: Not enlarged, swollen or pale.     Right Sinus: Maxillary sinus tenderness present. No frontal sinus tenderness.     Left Sinus: Maxillary sinus tenderness present. No frontal sinus tenderness.     Mouth/Throat:     Lips: Pink.     Mouth: Mucous membranes are moist.     Pharynx: Oropharynx is clear. No pharyngeal swelling, oropharyngeal exudate, posterior oropharyngeal erythema or uvula swelling.     Tonsils: No tonsillar exudate or tonsillar abscesses.  Cardiovascular:     Rate and Rhythm: Normal rate.     Heart sounds: Normal heart sounds.  Pulmonary:     Effort: Pulmonary effort is normal.     Breath sounds: Normal breath sounds.  Abdominal:     General: Bowel sounds are normal.     Palpations: Abdomen is soft.     Tenderness: There is no right CVA tenderness or left CVA tenderness.  Skin:    General: Skin is warm.  Neurological:  General: No focal deficit present.     Mental Status: She is alert.     Results for orders placed or performed in visit on 11/10/21  POCT Urinalysis Dipstick  Result Value Ref Range   Color, UA     Clarity, UA     Glucose, UA     Bilirubin, UA     Ketones, UA     Spec Grav, UA     Blood, UA     pH, UA     Protein, UA     Urobilinogen, UA     Nitrite, UA     Leukocytes, UA     Appearance     Odor      Last CBC Lab Results  Component Value Date   WBC 11.5 (H) 12/30/2019   HGB 14.7 12/30/2019   HCT 42.4 12/30/2019   MCV 98.4 12/30/2019   MCH 34.1 (H) 12/30/2019   RDW 13.2 12/30/2019   PLT 380 41/74/0814   Last metabolic panel Lab Results  Component Value Date   GLUCOSE 75 01/19/2021   NA 141 01/19/2021   K 4.8 01/19/2021   CL 109 (H) 01/19/2021   CO2 19 (L) 01/19/2021   BUN 10  01/19/2021   CREATININE 0.83 01/19/2021   EGFR 83 01/19/2021   CALCIUM 9.3 01/19/2021   PROT 6.3 01/19/2021   ALBUMIN 3.9 01/19/2021   LABGLOB 2.4 01/19/2021   AGRATIO 1.6 01/19/2021   BILITOT 0.4 01/19/2021   ALKPHOS 69 01/19/2021   AST 13 01/19/2021   ALT 6 01/19/2021   ANIONGAP 8 12/30/2019   Last lipids No results found for: CHOL, HDL, LDLCALC, LDLDIRECT, TRIG, CHOLHDL Last hemoglobin A1c No results found for: HGBA1C    The ASCVD Risk score (Arnett DK, et al., 2019) failed to calculate for the following reasons:   Cannot find a previous HDL lab   Cannot find a previous total cholesterol lab    Assessment & Plan:   1. Urinary urgency -Urine dipstick was done during visit and it was within normal limits, will still send out for culture.  She was advised to perform proper perineal hygiene and increase water intake. - UA/M w/rflx Culture, Routine; Future - POCT Urinalysis Dipstick; Future - UA/M w/rflx Culture, Routine - POCT Urinalysis Dipstick  2. Health care maintenance -Routine labs will be checked. - Lipid panel; Future - HgB A1c; Future - HgB A1c - Lipid panel  3. Always tired -She reports being tired and cold, will check CBC to rule out anemia, TSH and vitamin D. - CBC w/Diff; Future - Iron Binding Cap (TIBC)([facility]); Future - TSH; Future - Vitamin D (25 hydroxy); Future - Vitamin D (25 hydroxy) - TSH - Iron Binding Cap (TIBC)([facility]) - CBC w/Diff  4. Nasal congestion -She was started on Flonase and saline spray, was educated on medication side effects and use, advised to notify clinic. - fluticasone (FLONASE) 50 MCG/ACT nasal spray; Spray 2 sprays into both nostrils once daily.  Dispense: 16 g; Refill: 0 - sodium chloride (OCEAN) 0.65 % SOLN nasal spray; Place 1 spray into both nostrils daily as needed for congestion.  Dispense: 30 mL; Refill: 0  5. Sinus pressure -She was advised to use nasal saline spray, gargle with warm salt  water.  She was advised to notify clinic for worsening symptoms and go to the emergency room. - sodium chloride (OCEAN) 0.65 % SOLN nasal spray; Place 1 spray into both nostrils daily as needed for congestion.  Dispense: 30 mL;  Refill: 0   Return in about 8 weeks (around 01/05/2022), or if symptoms worsen or fail to improve.    Arpita Fentress Jerold Coombe, NP

## 2021-11-11 ENCOUNTER — Other Ambulatory Visit: Payer: Self-pay

## 2021-11-11 LAB — UA/M W/RFLX CULTURE, ROUTINE
Bilirubin, UA: NEGATIVE
Glucose, UA: NEGATIVE
Ketones, UA: NEGATIVE
Leukocytes,UA: NEGATIVE
Nitrite, UA: NEGATIVE
Protein,UA: NEGATIVE
RBC, UA: NEGATIVE
Specific Gravity, UA: 1.006 (ref 1.005–1.030)
Urobilinogen, Ur: 0.2 mg/dL (ref 0.2–1.0)
pH, UA: 7.5 (ref 5.0–7.5)

## 2021-11-11 LAB — CBC WITH DIFFERENTIAL/PLATELET
Basophils Absolute: 0 10*3/uL (ref 0.0–0.2)
Basos: 0 %
EOS (ABSOLUTE): 0.2 10*3/uL (ref 0.0–0.4)
Eos: 2 %
Hematocrit: 37.7 % (ref 34.0–46.6)
Hemoglobin: 13.2 g/dL (ref 11.1–15.9)
Immature Grans (Abs): 0 10*3/uL (ref 0.0–0.1)
Immature Granulocytes: 0 %
Lymphocytes Absolute: 5.3 10*3/uL — ABNORMAL HIGH (ref 0.7–3.1)
Lymphs: 56 %
MCH: 34 pg — ABNORMAL HIGH (ref 26.6–33.0)
MCHC: 35 g/dL (ref 31.5–35.7)
MCV: 97 fL (ref 79–97)
Monocytes Absolute: 0.8 10*3/uL (ref 0.1–0.9)
Monocytes: 9 %
Neutrophils Absolute: 3.2 10*3/uL (ref 1.4–7.0)
Neutrophils: 33 %
Platelets: 338 10*3/uL (ref 150–450)
RBC: 3.88 x10E6/uL (ref 3.77–5.28)
RDW: 13.1 % (ref 11.7–15.4)
WBC: 9.6 10*3/uL (ref 3.4–10.8)

## 2021-11-11 LAB — MICROSCOPIC EXAMINATION
Bacteria, UA: NONE SEEN
Casts: NONE SEEN /lpf
Epithelial Cells (non renal): NONE SEEN /hpf (ref 0–10)
RBC, Urine: NONE SEEN /hpf (ref 0–2)
WBC, UA: NONE SEEN /hpf (ref 0–5)

## 2021-11-11 LAB — LIPID PANEL
Chol/HDL Ratio: 4.8 ratio — ABNORMAL HIGH (ref 0.0–4.4)
Cholesterol, Total: 196 mg/dL (ref 100–199)
HDL: 41 mg/dL (ref >39)
LDL Chol Calc (NIH): 127 mg/dL — ABNORMAL HIGH (ref 0–99)
Triglycerides: 154 mg/dL — ABNORMAL HIGH (ref 0–149)
VLDL Cholesterol Cal: 28 mg/dL (ref 5–40)

## 2021-11-11 LAB — HEMOGLOBIN A1C
Est. average glucose Bld gHb Est-mCnc: 103 mg/dL
Hgb A1c MFr Bld: 5.2 % (ref 4.8–5.6)

## 2021-11-11 LAB — VITAMIN D 25 HYDROXY (VIT D DEFICIENCY, FRACTURES): Vit D, 25-Hydroxy: 36.2 ng/mL (ref 30.0–100.0)

## 2021-11-11 LAB — TSH: TSH: 1.74 u[IU]/mL (ref 0.450–4.500)

## 2021-11-11 LAB — IRON AND TIBC
Iron Saturation: 20 % (ref 15–55)
Iron: 65 ug/dL (ref 27–159)
Total Iron Binding Capacity: 321 ug/dL (ref 250–450)
UIBC: 256 ug/dL (ref 131–425)

## 2021-11-14 ENCOUNTER — Other Ambulatory Visit: Payer: Self-pay

## 2021-11-18 ENCOUNTER — Other Ambulatory Visit: Payer: Self-pay

## 2021-11-18 ENCOUNTER — Other Ambulatory Visit: Payer: Self-pay | Admitting: Gerontology

## 2021-11-18 DIAGNOSIS — R0981 Nasal congestion: Secondary | ICD-10-CM

## 2021-11-21 ENCOUNTER — Other Ambulatory Visit: Payer: Self-pay

## 2021-11-22 ENCOUNTER — Other Ambulatory Visit: Payer: Self-pay | Admitting: Gerontology

## 2021-11-22 ENCOUNTER — Other Ambulatory Visit: Payer: Self-pay

## 2021-11-22 DIAGNOSIS — Z8709 Personal history of other diseases of the respiratory system: Secondary | ICD-10-CM

## 2021-11-22 MED ORDER — FLUTICASONE FUROATE-VILANTEROL 100-25 MCG/ACT IN AEPB
1.0000 | INHALATION_SPRAY | Freq: Every day | RESPIRATORY_TRACT | 2 refills | Status: DC
  Filled 2021-11-22: qty 60, 60d supply, fill #0

## 2021-11-22 MED FILL — Fluticasone-Salmeterol Aer Powder BA 100-50 MCG/ACT: RESPIRATORY_TRACT | Qty: 60 | Fill #0 | Status: CN

## 2021-11-25 ENCOUNTER — Other Ambulatory Visit: Payer: Self-pay

## 2021-11-29 ENCOUNTER — Other Ambulatory Visit: Payer: Self-pay

## 2021-11-29 ENCOUNTER — Ambulatory Visit: Payer: Self-pay | Admitting: Gerontology

## 2021-12-14 ENCOUNTER — Other Ambulatory Visit: Payer: Self-pay

## 2021-12-15 ENCOUNTER — Other Ambulatory Visit: Payer: Self-pay

## 2021-12-15 MED ORDER — FLUTICASONE FUROATE-VILANTEROL 100-25 MCG/ACT IN AEPB
INHALATION_SPRAY | RESPIRATORY_TRACT | 3 refills | Status: DC
  Filled 2021-12-27: qty 180, 90d supply, fill #0
  Filled 2022-04-10: qty 180, 90d supply, fill #1
  Filled 2022-08-03: qty 180, 90d supply, fill #2
  Filled 2022-10-25: qty 180, 90d supply, fill #3

## 2021-12-27 ENCOUNTER — Other Ambulatory Visit: Payer: Self-pay | Admitting: Gerontology

## 2021-12-27 ENCOUNTER — Other Ambulatory Visit: Payer: Self-pay

## 2021-12-27 DIAGNOSIS — R0981 Nasal congestion: Secondary | ICD-10-CM

## 2021-12-27 MED ORDER — GABAPENTIN 600 MG PO TABS
600.0000 mg | ORAL_TABLET | Freq: Three times a day (TID) | ORAL | 0 refills | Status: DC
  Filled 2021-12-27: qty 270, 90d supply, fill #0

## 2021-12-27 MED ORDER — COMPACT SPACE CHAMBER DEVI
0 refills | Status: DC
  Filled 2021-12-27: qty 1, 15d supply, fill #0

## 2021-12-27 MED ORDER — FLUTICASONE PROPIONATE 50 MCG/ACT NA SUSP
2.0000 | Freq: Every day | NASAL | 0 refills | Status: DC
  Filled 2021-12-27: qty 16, 30d supply, fill #0

## 2021-12-27 MED FILL — Cetirizine HCl Tab 10 MG: ORAL | 30 days supply | Qty: 30 | Fill #0 | Status: AC

## 2021-12-28 ENCOUNTER — Other Ambulatory Visit: Payer: Self-pay

## 2021-12-28 MED ORDER — GABAPENTIN 600 MG PO TABS: ORAL_TABLET | ORAL | 0 refills | Status: DC

## 2021-12-29 ENCOUNTER — Other Ambulatory Visit: Payer: Self-pay

## 2022-01-05 ENCOUNTER — Ambulatory Visit: Payer: Self-pay | Admitting: Gerontology

## 2022-01-09 ENCOUNTER — Other Ambulatory Visit: Payer: Self-pay

## 2022-02-06 ENCOUNTER — Other Ambulatory Visit: Payer: Self-pay | Admitting: Gerontology

## 2022-02-06 ENCOUNTER — Other Ambulatory Visit: Payer: Self-pay

## 2022-02-06 DIAGNOSIS — R251 Tremor, unspecified: Secondary | ICD-10-CM

## 2022-02-07 ENCOUNTER — Other Ambulatory Visit: Payer: Self-pay

## 2022-02-07 ENCOUNTER — Ambulatory Visit: Payer: Self-pay | Admitting: Gerontology

## 2022-02-07 ENCOUNTER — Telehealth: Payer: Self-pay | Admitting: Gerontology

## 2022-02-07 MED FILL — Topiramate Tab 50 MG: ORAL | 30 days supply | Qty: 60 | Fill #0 | Status: AC

## 2022-02-07 NOTE — Telephone Encounter (Signed)
Pt called in to reschedule appt from today to Thursday.

## 2022-02-09 ENCOUNTER — Other Ambulatory Visit: Payer: Self-pay

## 2022-02-09 ENCOUNTER — Encounter: Payer: Self-pay | Admitting: Gerontology

## 2022-02-09 ENCOUNTER — Ambulatory Visit: Payer: Self-pay | Admitting: Gerontology

## 2022-02-09 VITALS — BP 108/67 | HR 59 | Temp 98.2°F | Resp 16 | Ht 63.0 in | Wt 118.7 lb

## 2022-02-09 DIAGNOSIS — M545 Low back pain, unspecified: Secondary | ICD-10-CM

## 2022-02-09 DIAGNOSIS — J3489 Other specified disorders of nose and nasal sinuses: Secondary | ICD-10-CM

## 2022-02-09 DIAGNOSIS — R0981 Nasal congestion: Secondary | ICD-10-CM

## 2022-02-09 DIAGNOSIS — L853 Xerosis cutis: Secondary | ICD-10-CM | POA: Insufficient documentation

## 2022-02-09 DIAGNOSIS — H60503 Unspecified acute noninfective otitis externa, bilateral: Secondary | ICD-10-CM

## 2022-02-09 DIAGNOSIS — R251 Tremor, unspecified: Secondary | ICD-10-CM

## 2022-02-09 MED ORDER — HYDROCORTISONE 1 % EX LOTN
1.0000 | TOPICAL_LOTION | Freq: Two times a day (BID) | CUTANEOUS | 0 refills | Status: DC
  Filled 2022-02-09: qty 118, 12d supply, fill #0

## 2022-02-09 MED ORDER — IBUPROFEN 800 MG PO TABS
800.0000 mg | ORAL_TABLET | Freq: Every day | ORAL | 0 refills | Status: DC
  Filled 2022-02-09: qty 14, 14d supply, fill #0

## 2022-02-09 MED ORDER — AMOXICILLIN-POT CLAVULANATE 875-125 MG PO TABS
1.0000 | ORAL_TABLET | Freq: Two times a day (BID) | ORAL | 0 refills | Status: DC
  Filled 2022-02-09: qty 14, 7d supply, fill #0

## 2022-02-09 MED ORDER — SALINE SPRAY 0.65 % NA SOLN
1.0000 | NASAL | 0 refills | Status: DC | PRN
  Filled 2022-02-09: qty 30, fill #0

## 2022-02-09 NOTE — Progress Notes (Signed)
Established Patient Office Visit  Subjective   Patient ID: Natasha Hanson, female    DOB: 1966-04-25  Age: 56 y.o. MRN: [id number]  Chief Complaint  Patient presents with   Sinus Problem    Patient c/o sinus drainage, sore throat, clogged ears and runny nose x 10 days. Patient has been taking OTC sinus medicine and nasal spray without relief.    Follow-up    HPI  Natasha Hanson  is a 56 y/o female who has history of Allergy, Arthritis, COPD, Tremors of nervous system neck presents for routine follow up visit. 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 We discussed psychogenic tremors, however as per your wish, I will refer you to our movement clinic. They can be reached at [phone].  She self discontinued her Propranolol 1 month ago because of bradycardia and dizziness. Currently, she c/o sinus pressure, ear clogged, post nasal drip, sore throat, productive cough with yellowish phelgm and rhinnorrhea  that has been going on for 10 days, and she didn't test for Covid. She denies chest pain, palpitation , shortness of breath, fever and chills. She also c/o non traumatic, non radiating, constant dull 7/10 pain to back that's chronic. She denies saddle anesthesia, bowel nor bladder incontinence. She states that she's doing well and offers no further complaint.    Review of Systems  Constitutional: Negative.   HENT:  Positive for sinus pain and sore throat.   Respiratory:  Positive for cough and shortness of breath.   Cardiovascular: Negative.   Genitourinary:  Negative for dysuria, flank pain, frequency and urgency.  Musculoskeletal:  Positive for back pain (chronic).  Neurological: Negative.   Psychiatric/Behavioral: Negative.        Objective:     BP 108/67 (BP Location: Right Arm, Patient Position:  Sitting, Cuff Size: Normal)   Pulse (!) 59   Temp 98.2 F (36.8 C) (Oral)   Resp 16   Ht 5' 3"  (1.6 m)   Wt 118 lb 11.2 oz (53.8 kg)   SpO2 98%   BMI 21.03 kg/m  BP Readings from Last 3 Encounters:  02/09/22 108/67  11/10/21 114/75  10/18/21 109/70   Wt Readings from Last 3 Encounters:  02/09/22 118 lb 11.2 oz (53.8 kg)  11/10/21 125 lb 8 oz (56.9 kg)  10/18/21 126 lb 1.6 oz (57.2 kg)      Physical Exam HENT:     Head: Normocephalic and atraumatic.     Right Ear: Hearing normal. No tenderness. A middle ear effusion is present. There is no impacted cerumen. No mastoid tenderness. Tympanic membrane is erythematous.     Left Ear: Hearing normal. No tenderness. A middle ear effusion is present. There is no impacted cerumen. No mastoid tenderness. Tympanic membrane is erythematous.     Nose:     Right Sinus: Maxillary sinus tenderness present.     Left Sinus: Maxillary sinus tenderness present.     Mouth/Throat:     Mouth: Mucous membranes are moist.  Cardiovascular:     Rate and Rhythm: Normal rate and regular rhythm.     Pulses: Normal pulses.     Heart sounds: Normal heart sounds.  Pulmonary:     Effort: Pulmonary effort is normal.     Breath sounds: Normal breath sounds.  Neurological:  General: No focal deficit present.     Mental Status: She is alert and oriented to person, place, and time. Mental status is at baseline.  Psychiatric:        Mood and Affect: Mood normal.        Behavior: Behavior normal.        Thought Content: Thought content normal.        Judgment: Judgment normal.      No results found for any visits on 02/09/22.  Last CBC Lab Results  Component Value Date   WBC 9.6 11/10/2021   HGB 13.2 11/10/2021   HCT 37.7 11/10/2021   MCV 97 11/10/2021   MCH 34.0 (H) 11/10/2021   RDW 13.1 11/10/2021   PLT 338 97/67/3419   Last metabolic panel Lab Results  Component Value Date   GLUCOSE 75 01/19/2021   NA 141 01/19/2021   K 4.8 01/19/2021    CL 109 (H) 01/19/2021   CO2 19 (L) 01/19/2021   BUN 10 01/19/2021   CREATININE 0.83 01/19/2021   EGFR 83 01/19/2021   CALCIUM 9.3 01/19/2021   PROT 6.3 01/19/2021   ALBUMIN 3.9 01/19/2021   LABGLOB 2.4 01/19/2021   AGRATIO 1.6 01/19/2021   BILITOT 0.4 01/19/2021   ALKPHOS 69 01/19/2021   AST 13 01/19/2021   ALT 6 01/19/2021   ANIONGAP 8 12/30/2019   Last lipids Lab Results  Component Value Date   CHOL 196 11/10/2021   HDL 41 11/10/2021   LDLCALC 127 (H) 11/10/2021   TRIG 154 (H) 11/10/2021   CHOLHDL 4.8 (H) 11/10/2021   Last hemoglobin A1c Lab Results  Component Value Date   HGBA1C 5.2 11/10/2021      The 10-year ASCVD risk score (Arnett DK, et al., 2019) is: 4.7%    1. Nasal congestion -She will continue to use Nasal spray. - sodium chloride (OCEAN) 0.65 % SOLN nasal spray; Place 1 spray into both nostrils daily as needed for congestion.  Dispense: 30 mL; Refill: 0  2. Sinus pressure -She will continue using Saline spray and take Tylenol 500 mg every 12 hours as needed for pain. - sodium chloride (OCEAN) 0.65 % SOLN nasal spray; Place 1 spray into both nostrils daily as needed for congestion.  Dispense: 30 mL; Refill: 0  3. Tremor - She was encouraged to complete [facility] application and follow up at the Movement clinic.  4. Acute otitis externa of both ears, unspecified type - Has bilateral mid ear effusion , was started on Augmentin, educated on medication side effects and advised to notify clinic. - amoxicillin-clavulanate (AUGMENTIN) 875-125 MG tablet; Take 1 tablet by mouth 2 (two) times daily.  Dispense: 14 tablet; Refill: 0  5. Xerosis of skin - She will continue on current medication. - hydrocortisone 1 % lotion; Apply 1 Application topically 2 (two) times daily.  Dispense: 118 mL; Refill: 0  6. Chronic low back pain (1ry area of Pain) (Bilateral) (Midline) (L>R) w/o sciatica - She will continue on Ibuprofen, educated on medication side effects  and was advised to notify clinic and follow up at the Pain clinic. She was advised to go to the ED for worsening symptoms. - ibuprofen (ADVIL) 800 MG tablet; Take 1 tablet (800 mg total) by mouth daily.  Dispense: 14 tablet; Refill: 0   Return in about 27 days (around 03/08/2022), or if symptoms worsen or fail to improve.    Kevion Fatheree Jerold Coombe, NP

## 2022-03-01 ENCOUNTER — Other Ambulatory Visit: Payer: Self-pay

## 2022-03-08 ENCOUNTER — Other Ambulatory Visit: Payer: Self-pay | Admitting: Gerontology

## 2022-03-08 ENCOUNTER — Other Ambulatory Visit: Payer: Self-pay

## 2022-03-08 DIAGNOSIS — R0981 Nasal congestion: Secondary | ICD-10-CM

## 2022-03-08 DIAGNOSIS — T7840XD Allergy, unspecified, subsequent encounter: Secondary | ICD-10-CM

## 2022-03-08 MED ORDER — FLUTICASONE PROPIONATE 50 MCG/ACT NA SUSP
2.0000 | Freq: Every day | NASAL | 0 refills | Status: DC
  Filled 2022-03-08: qty 16, 30d supply, fill #0

## 2022-03-08 MED ORDER — CETIRIZINE HCL 10 MG PO TABS
10.0000 mg | ORAL_TABLET | Freq: Every day | ORAL | 3 refills | Status: DC
  Filled 2022-03-08: qty 30, 30d supply, fill #0
  Filled 2022-07-28: qty 30, 30d supply, fill #1

## 2022-03-08 MED FILL — Topiramate Tab 50 MG: ORAL | 30 days supply | Qty: 60 | Fill #1 | Status: AC
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
## 2022-03-27 ENCOUNTER — Other Ambulatory Visit
Admission: RE | Admit: 2022-03-27 | Discharge: 2022-03-27 | Disposition: A | Discharge status: Home / Self Care | Payer: Self-pay | Source: Ambulatory Visit | Attending: Pain Medicine | Admitting: Pain Medicine

## 2022-03-27 ENCOUNTER — Ambulatory Visit
Admission: RE | Admit: 2022-03-27 | Discharge: 2022-03-27 | Disposition: A | Discharge status: Home / Self Care | Payer: Self-pay | Source: Ambulatory Visit | Attending: Pain Medicine | Admitting: Pain Medicine

## 2022-03-27 ENCOUNTER — Other Ambulatory Visit: Payer: Self-pay

## 2022-03-27 ENCOUNTER — Ambulatory Visit: Payer: Self-pay | Attending: Pain Medicine | Admitting: Pain Medicine

## 2022-03-27 ENCOUNTER — Encounter: Payer: Self-pay | Admitting: Pain Medicine

## 2022-03-27 ENCOUNTER — Other Ambulatory Visit: Payer: Self-pay | Admitting: Gerontology

## 2022-03-27 VITALS — BP 111/60 | HR 58 | Temp 97.2°F | Ht 63.0 in | Wt 120.0 lb

## 2022-03-27 DIAGNOSIS — M19011 Primary osteoarthritis, right shoulder: Secondary | ICD-10-CM | POA: Insufficient documentation

## 2022-03-27 DIAGNOSIS — R3915 Urgency of urination: Secondary | ICD-10-CM | POA: Insufficient documentation

## 2022-03-27 DIAGNOSIS — G8929 Other chronic pain: Secondary | ICD-10-CM

## 2022-03-27 DIAGNOSIS — M85811 Other specified disorders of bone density and structure, right shoulder: Secondary | ICD-10-CM | POA: Insufficient documentation

## 2022-03-27 DIAGNOSIS — L853 Xerosis cutis: Secondary | ICD-10-CM | POA: Insufficient documentation

## 2022-03-27 DIAGNOSIS — M542 Cervicalgia: Secondary | ICD-10-CM | POA: Insufficient documentation

## 2022-03-27 DIAGNOSIS — M19012 Primary osteoarthritis, left shoulder: Secondary | ICD-10-CM | POA: Insufficient documentation

## 2022-03-27 DIAGNOSIS — M25512 Pain in left shoulder: Secondary | ICD-10-CM

## 2022-03-27 DIAGNOSIS — M545 Low back pain, unspecified: Secondary | ICD-10-CM | POA: Insufficient documentation

## 2022-03-27 DIAGNOSIS — G894 Chronic pain syndrome: Secondary | ICD-10-CM

## 2022-03-27 DIAGNOSIS — M25511 Pain in right shoulder: Secondary | ICD-10-CM

## 2022-03-27 DIAGNOSIS — M47816 Spondylosis without myelopathy or radiculopathy, lumbar region: Secondary | ICD-10-CM | POA: Insufficient documentation

## 2022-03-27 DIAGNOSIS — M170 Bilateral primary osteoarthritis of knee: Secondary | ICD-10-CM | POA: Insufficient documentation

## 2022-03-27 DIAGNOSIS — R079 Chest pain, unspecified: Secondary | ICD-10-CM | POA: Insufficient documentation

## 2022-03-27 DIAGNOSIS — Z789 Other specified health status: Secondary | ICD-10-CM

## 2022-03-27 DIAGNOSIS — M546 Pain in thoracic spine: Secondary | ICD-10-CM | POA: Insufficient documentation

## 2022-03-27 DIAGNOSIS — M25473 Effusion, unspecified ankle: Secondary | ICD-10-CM | POA: Insufficient documentation

## 2022-03-27 DIAGNOSIS — M47812 Spondylosis without myelopathy or radiculopathy, cervical region: Secondary | ICD-10-CM | POA: Insufficient documentation

## 2022-03-27 DIAGNOSIS — M899 Disorder of bone, unspecified: Secondary | ICD-10-CM

## 2022-03-27 DIAGNOSIS — M25519 Pain in unspecified shoulder: Secondary | ICD-10-CM | POA: Insufficient documentation

## 2022-03-27 LAB — SEDIMENTATION RATE: Sed Rate: 14 mm/hr (ref 0–30)

## 2022-03-27 LAB — C-REACTIVE PROTEIN: CRP: <0.5 mg/dL (ref <1.0)

## 2022-03-27 MED ORDER — PREDNISONE 20 MG PO TABS
ORAL_TABLET | ORAL | 0 refills | Status: AC
  Filled 2022-03-27: qty 18, 9d supply, fill #0

## 2022-03-27 MED FILL — Gabapentin Tab 600 MG: ORAL | 90 days supply | Qty: 270 | Fill #0 | Status: CN

## 2022-03-27 NOTE — Progress Notes (Signed)
Safety precautions to be maintained throughout the outpatient stay will include: orient to surroundings, keep bed in low position, maintain call bell within reach at all times, provide assistance with transfer out of bed and ambulation.  

## 2022-03-28 ENCOUNTER — Other Ambulatory Visit: Payer: Self-pay

## 2022-03-28 MED FILL — Gabapentin Tab 600 MG: ORAL | 90 days supply | Qty: 270 | Fill #0 | Status: AC

## 2022-04-06 ENCOUNTER — Other Ambulatory Visit: Payer: Self-pay

## 2022-04-06 ENCOUNTER — Encounter: Payer: Self-pay | Admitting: Gerontology

## 2022-04-06 ENCOUNTER — Ambulatory Visit: Payer: Self-pay | Admitting: Gerontology

## 2022-04-06 VITALS — BP 102/64 | HR 61 | Temp 97.7°F | Wt 121.7 lb

## 2022-04-06 DIAGNOSIS — R0981 Nasal congestion: Secondary | ICD-10-CM

## 2022-04-06 DIAGNOSIS — J3489 Other specified disorders of nose and nasal sinuses: Secondary | ICD-10-CM

## 2022-04-06 DIAGNOSIS — Z23 Encounter for immunization: Secondary | ICD-10-CM

## 2022-04-06 DIAGNOSIS — Z0000 Encounter for general adult medical examination without abnormal findings: Secondary | ICD-10-CM

## 2022-04-06 DIAGNOSIS — H938X2 Other specified disorders of left ear: Secondary | ICD-10-CM

## 2022-04-06 MED ORDER — SALINE SPRAY 0.65 % NA SOLN
1.0000 | NASAL | 0 refills | Status: DC | PRN
  Filled 2022-04-06: qty 30, fill #0

## 2022-04-06 MED FILL — Topiramate Tab 50 MG: ORAL | 30 days supply | Qty: 60 | Fill #2 | Status: AC

## 2022-04-06 NOTE — Progress Notes (Signed)
Established Patient Office Visit  Subjective   Patient ID: Natasha Hanson, female    DOB: 10/07/1965  Age: 56 y.o. MRN: [id number]  Chief Complaint  Patient presents with   Follow-up    HPI  Natasha Hanson  is a 56 y/o female who has history of Allergy, Arthritis, COPD, Tremors of nervous system neck presents for routine follow up visit. She saw Dr. Dossie Arbour on 03/27/22 for her chronic neck, back, and shoulder pain. Cervical spine imaging was notable for loss of normal cervical lordosis and mild multilevel disc degeneration/endplate osteophyte formation. Lumbar spine imaging was notable for thoracolumbar spine scoliosis concave right and diffuse multilevel degenerative change. Her left and right shoulder imaging were both notable for diffuse osteopenia and mild acromioclavicular and glenohumeral degenerative change. She has a follow-up appointment with Dr. Dossie Arbour on 04/10/22 to go over these image findings. Today she is 8/10 pain in her neck, shoulders, and back. Her neck is stiff and tight, and her back and shoulders feels like pressure. The pain doesn't radiate. She says nothing makes the pain better or worse, it is just a constant steady state. Her head/neck tremor is still present, and she has a follow-up appointment with Dr. Lucile Crater on 04/21/22. Today, she endorses ear fullness, drainage in the back of her throat, sinus pressure, and an intermittent cough that is productive for clear phlegm. She denies shortness of breath, green/yellow sputum production, fever, or chills. She was treated on 02/09/22 with augmentin for acute otitis media. She states her breathing is well-controlled and she only uses her inhalers when needed. She offers no further complaints.  Review of Systems  Constitutional: Negative.   HENT:  Positive for congestion (drainage in throat), ear pain (clogged ears) and sinus pain (mild). Negative for sore throat.   Eyes: Negative.   Respiratory:  Positive for cough and sputum  production (clear phlegm). Negative for shortness of breath and wheezing.   Cardiovascular: Negative.   Gastrointestinal: Negative.   Genitourinary: Negative.   Musculoskeletal:  Positive for back pain, joint pain (bilateral shoulder pain) and neck pain.  Neurological:  Positive for tremors (neck/head) and headaches.  Endo/Heme/Allergies: Negative.   Psychiatric/Behavioral: Negative.        Objective:     Wt 121 lb 11.2 oz (55.2 kg)   BMI 21.56 kg/m  BP Readings from Last 3 Encounters:  03/27/22 111/60  02/09/22 108/67  11/10/21 114/75   Wt Readings from Last 3 Encounters:  04/06/22 121 lb 11.2 oz (55.2 kg)  03/27/22 120 lb (54.4 kg)  02/09/22 118 lb 11.2 oz (53.8 kg)      Physical Exam Constitutional:      Appearance: Normal appearance. She is normal weight.  HENT:     Right Ear: Tympanic membrane, ear canal and external ear normal.     Left Ear: Tympanic membrane, ear canal and external ear normal.     Nose:     Right Sinus: Maxillary sinus tenderness and frontal sinus tenderness present.     Left Sinus: Maxillary sinus tenderness and frontal sinus tenderness present.  Cardiovascular:     Rate and Rhythm: Normal rate and regular rhythm.  Pulmonary:     Effort: Pulmonary effort is normal.     Breath sounds: Normal breath sounds.  Skin:    General: Skin is warm and dry.  Neurological:     General: No focal deficit present.     Mental Status: She is alert and oriented to person, place, and time.  Mental status is at baseline.  Psychiatric:        Mood and Affect: Mood normal.        Behavior: Behavior normal.        Thought Content: Thought content normal.        Judgment: Judgment normal.      No results found for any visits on 04/06/22.  Last CBC Lab Results  Component Value Date   WBC 9.6 11/10/2021   HGB 13.2 11/10/2021   HCT 37.7 11/10/2021   MCV 97 11/10/2021   MCH 34.0 (H) 11/10/2021   RDW 13.1 11/10/2021   PLT 338 65/46/5035   Last metabolic  panel Lab Results  Component Value Date   GLUCOSE 75 01/19/2021   NA 141 01/19/2021   K 4.8 01/19/2021   CL 109 (H) 01/19/2021   CO2 19 (L) 01/19/2021   BUN 10 01/19/2021   CREATININE 0.83 01/19/2021   EGFR 83 01/19/2021   CALCIUM 9.3 01/19/2021   PROT 6.3 01/19/2021   ALBUMIN 3.9 01/19/2021   LABGLOB 2.4 01/19/2021   AGRATIO 1.6 01/19/2021   BILITOT 0.4 01/19/2021   ALKPHOS 69 01/19/2021   AST 13 01/19/2021   ALT 6 01/19/2021   ANIONGAP 8 12/30/2019   Last lipids Lab Results  Component Value Date   CHOL 196 11/10/2021   HDL 41 11/10/2021   LDLCALC 127 (H) 11/10/2021   TRIG 154 (H) 11/10/2021   CHOLHDL 4.8 (H) 11/10/2021   Last hemoglobin A1c Lab Results  Component Value Date   HGBA1C 5.2 11/10/2021   Last thyroid functions Lab Results  Component Value Date   TSH 1.740 11/10/2021   Last vitamin D Lab Results  Component Value Date   25OHVITD2 <1.0 09/09/2019   25OHVITD3 33 09/09/2019   VD25OH 36.2 11/10/2021   Last vitamin B12 and Folate Lab Results  Component Value Date   VITAMINB12 299 09/09/2019      The 10-year ASCVD risk score (Arnett DK, et al., 2019) is: 5%    Assessment & Plan:   1. Nasal congestion - She can use sodium chloride nasal spray to clear her sinuses and help with her congestion. She was encouraged to drink hot lemon water or tea. Call or return to the clinic for worsening symptoms.  - sodium chloride (OCEAN) 0.65 % SOLN nasal spray; Place 1 spray into both nostrils daily as needed for congestion.  Dispense: 30 mL; Refill: 0  2. Sinus pressure - She should stay hydrated and get adequate rest. She can use Ocean Spray PRN.  She was encouraged on smoking cessation.  3. Sensation of fullness in ear, left - Her tympanic membrane had no abnormalities on physical exam. Prescription for ocean spray. Return to the clinic with new or worsening symptoms.  - sodium chloride (OCEAN) 0.65 % SOLN nasal spray; Place 1 spray into both nostrils  daily as needed for congestion.  Dispense: 30 mL; Refill: 0  4. Health care maintenance - Lung CT ordered. She was given a [facility] flyer to schedule a pap smear and mammogram. She should check to see if her charity care application has been approved, so that she may schedule a colonoscopy.  - CT CHEST LUNG CA SCREEN LOW DOSE W/O CM; Future  5. Flu vaccine need - She received her annual flu vaccine today in clinic. Educated on side effects.  - Flu Vaccine QUAD 6+ mos PF IM (Fluarix Quad PF)   Follow-up in 3 months, on 07/06/2022   Rayvon Char,  FNP Student

## 2022-04-06 NOTE — Patient Instructions (Signed)
Smoking Tobacco Information, Adult Smoking tobacco can be harmful to your health. Tobacco contains a toxic colorless chemical called nicotine. Nicotine causes changes in your brain that make you want more and more. This is called addiction. This can make it hard to stop smoking once you start. Tobacco also has other toxic chemicals that can hurt your body and raise your risk of many cancers. Menthol or "lite" tobacco or cigarette brands are not safer than regular brands. How can smoking tobacco affect me? Smoking tobacco puts you at risk for: Cancer. Smoking is most commonly associated with lung cancer, but can also lead to cancer in other parts of the body. Chronic obstructive pulmonary disease (COPD). This is a long-term lung condition that makes it hard to breathe. It also gets worse over time. High blood pressure (hypertension), heart disease, stroke, heart attack, and lung infections, such as pneumonia. Cataracts. This is when the lenses in the eyes become clouded. Digestive problems. This may include peptic ulcers, heartburn, and gastroesophageal reflux disease (GERD). Oral health problems, such as gum disease, mouth sores, and tooth loss. Loss of taste and smell. Smoking also affects how you look and smell. Smoking may cause: Wrinkles. Yellow or stained teeth, fingers, and fingernails. Bad breath. Bad-smelling clothes and hair. Smoking tobacco can also affect your social life, because: It may be challenging to find places to smoke when away from home. Many workplaces, restaurants, hotels, and public places are tobacco-free. Smoking is expensive. This is due to the cost of tobacco and the long-term costs of treating health problems from smoking. Secondhand smoke may affect those around you. Secondhand smoke can cause lung cancer, breathing problems, and heart disease. Children of smokers have a higher risk for: Sudden infant death syndrome (SIDS). Ear infections. Lung infections. What  actions can I take to prevent health problems? Quit smoking  Do not start smoking. Quit if you already smoke. Do not replace cigarette smoking with vaping devices, such as e-cigarettes. Make a plan to quit smoking and commit to it. Look for programs to help you, and ask your health care provider for recommendations and ideas. Set a date and write down all the reasons you want to quit. Let your friends and family know you are quitting so they can help and support you. Consider finding friends who also want to quit. It can be easier to quit with someone else, so that you can support each other. Talk with your health care provider about using nicotine replacement medicines to help you quit. These include gum, lozenges, patches, sprays, or pills. If you try to quit but return to smoking, stay positive. It is common to slip up when you first quit, so take it one day at a time. Be prepared for cravings. When you feel the urge to smoke, chew gum or suck on hard candy. Lifestyle Stay busy. Take care of your body. Get plenty of exercise, eat a healthy diet, and drink plenty of water. Find ways to manage your stress, such as meditation, yoga, exercise, or time spent with friends and family. Ask your health care provider about having regular tests (screenings) to check for cancer. This may include blood tests, imaging tests, and other tests. Where to find support To get support to quit smoking, consider: Asking your health care provider for more information and resources. Joining a support group for people who want to quit smoking in your local community. There are many effective programs that may help you to quit. Calling the smokefree.gov counselor   helpline at 1-800-QUIT-NOW 210-309-3373). Where to find more information You may find more information about quitting smoking from: Centers for Disease Control and Prevention: http://www.osborne.com/ [employer]: smokefree.gov American Lung Association:  freedomfromsmoking.org Contact a health care provider if: You have problems breathing. Your lips, nose, or fingers turn blue. You have chest pain. You are coughing up blood. You feel like you will faint. You have other health changes that cause you to worry. Summary Smoking tobacco can negatively affect your health, the health of those around you, your finances, and your social life. Do not start smoking. Quit if you already smoke. If you need help quitting, ask your health care provider. Consider joining a support group for people in your local community who want to quit smoking. There are many effective programs that may help you to quit. This information is not intended to replace advice given to you by your health care provider. Make sure you discuss any questions you have with your health care provider. Document Revised: 05/24/2021 Document Reviewed: 05/24/2021 Elsevier Patient Education  2023 Else[employer]. Chronic Obstructive Pulmonary Disease  Chronic obstructive pulmonary disease (COPD) is a long-term (chronic) lung problem. When you have COPD, it is hard for air to get in and out of your lungs. Usually the condition gets worse over time, and your lungs will never return to normal. There are things you can do to keep yourself as healthy as possible. What are the causes? Smoking. This is the most common cause. Certain genes passed from parent to child (inherited). What increases the risk? Being exposed to secondhand smoke from cigarettes, pipes, or cigars. Being exposed to chemicals and other irritants, such as fumes and dust in the work environment. Having chronic lung conditions or infections. What are the signs or symptoms? Shortness of breath, especially during physical activity. A long-term cough with a large amount of thick mucus. Sometimes, the cough may not have any mucus (dry cough). Wheezing. Breathing quickly. Skin that looks gray or blue, especially in the fingers,  toes, or lips. Feeling tired (fatigue). Weight loss. Chest tightness. Having infections often. Episodes when breathing symptoms become much worse (exacerbations). At the later stages of this disease, you may have swelling in the ankles, feet, or legs. How is this treated? Taking medicines. Quitting smoking, if you smoke. Rehabilitation. This includes steps to make your body work better. It may involve a team of specialists. Doing exercises. Making changes to your diet. Using oxygen. Lung surgery. Lung transplant. Comfort measures (palliative care). Follow these instructions at home: Medicines Take over-the-counter and prescription medicines only as told by your doctor. Talk to your doctor before taking any cough or allergy medicines. You may need to avoid medicines that cause your lungs to be dry. Lifestyle If you smoke, stop smoking. Smoking makes the problem worse. Do not smoke or use any products that contain nicotine or tobacco. If you need help quitting, ask your doctor. Avoid being around things that make your breathing worse. This may include smoke, chemicals, and fumes. Stay active, but remember to rest as well. Learn and use tips on how to manage stress and control your breathing. Make sure you get enough sleep. Most adults need at least 7 hours of sleep every night. Eat healthy foods. Eat smaller meals more often. Rest before meals. Controlled breathing Learn and use tips on how to control your breathing as told by your doctor. Try: Breathing in (inhaling) through your nose for 1 second. Then, pucker your lips and breath out (exhale)  through your lips for 2 seconds. Putting one hand on your belly (abdomen). Breathe in slowly through your nose for 1 second. Your hand on your belly should move out. Pucker your lips and breathe out slowly through your lips. Your hand on your belly should move in as you breathe out.  Controlled coughing Learn and use controlled coughing to  clear mucus from your lungs. Follow these steps: Lean your head a little forward. Breathe in deeply. Try to hold your breath for 3 seconds. Keep your mouth slightly open while coughing 2 times. Spit any mucus out into a tissue. Rest and do the steps again 1 or 2 times as needed. General instructions Make sure you get all the shots (vaccines) that your doctor recommends. Ask your doctor about a flu shot and a pneumonia shot. Use oxygen therapy and pulmonary rehabilitation if told by your doctor. If you need home oxygen therapy, ask your doctor if you should buy a tool to measure your oxygen level (oximeter). Make a COPD action plan with your doctor. This helps you to know what to do if you feel worse than usual. Manage any other conditions you have as told by your doctor. Avoid going outside when it is very hot, cold, or humid. Avoid people who have a sickness you can catch (contagious). Keep all follow-up visits. Contact a doctor if: You cough up more mucus than usual. There is a change in the color or thickness of the mucus. It is harder to breathe than usual. Your breathing is faster than usual. You have trouble sleeping. You need to use your medicines more often than usual. You have trouble doing your normal activities such as getting dressed or walking around the house. Get help right away if: You have shortness of breath while resting. You have shortness of breath that stops you from: Being able to talk. Doing normal activities. Your chest hurts for longer than 5 minutes. Your skin color is more blue than usual. Your pulse oximeter shows that you have low oxygen for longer than 5 minutes. You have a fever. You feel too tired to breathe normally. These symptoms may represent a serious problem that is an emergency. Do not wait to see if the symptoms will go away. Get medical help right away. Call your local emergency services (911 in the [place] Do not drive yourself to the  hospital. Summary Chronic obstructive pulmonary disease (COPD) is a long-term lung problem. The way your lungs work will never return to normal. Usually the condition gets worse over time. There are things you can do to keep yourself as healthy as possible. Take over-the-counter and prescription medicines only as told by your doctor. If you smoke, stop. Smoking makes the problem worse. This information is not intended to replace advice given to you by your health care provider. Make sure you discuss any questions you have with your health care provider. Document Revised: 04/06/2020 Document Reviewed: 04/06/2020 Elsevier Patient Education  [place].

## 2022-04-10 ENCOUNTER — Encounter (INDEPENDENT_AMBULATORY_CARE_PROVIDER_SITE_OTHER): Payer: Self-pay

## 2022-04-10 ENCOUNTER — Ambulatory Visit (HOSPITAL_BASED_OUTPATIENT_CLINIC_OR_DEPARTMENT_OTHER): Payer: Self-pay | Admitting: Pain Medicine

## 2022-04-10 ENCOUNTER — Other Ambulatory Visit: Payer: Self-pay

## 2022-04-10 DIAGNOSIS — Z91199 Patient's noncompliance with other medical treatment and regimen due to unspecified reason: Secondary | ICD-10-CM

## 2022-04-10 NOTE — Progress Notes (Signed)
NO-SHOW/canceled appointment (04/10/2022)

## 2022-04-11 ENCOUNTER — Other Ambulatory Visit: Payer: Self-pay

## 2022-04-26 ENCOUNTER — Other Ambulatory Visit: Payer: Self-pay

## 2022-04-28 ENCOUNTER — Other Ambulatory Visit: Payer: Self-pay

## 2022-04-28 ENCOUNTER — Ambulatory Visit
Admission: EM | Admit: 2022-04-28 | Discharge: 2022-04-28 | Disposition: A | Discharge status: Home / Self Care | Payer: Self-pay | Attending: Emergency Medicine | Admitting: Emergency Medicine

## 2022-04-28 DIAGNOSIS — J441 Chronic obstructive pulmonary disease with (acute) exacerbation: Secondary | ICD-10-CM | POA: Insufficient documentation

## 2022-04-28 DIAGNOSIS — Z1152 Encounter for screening for COVID-19: Secondary | ICD-10-CM | POA: Insufficient documentation

## 2022-04-28 DIAGNOSIS — R059 Cough, unspecified: Secondary | ICD-10-CM | POA: Insufficient documentation

## 2022-04-28 MED ORDER — PREDNISONE 10 MG PO TABS
40.0000 mg | ORAL_TABLET | Freq: Every day | ORAL | 0 refills | Status: AC
  Filled 2022-04-28: qty 20, 5d supply, fill #0

## 2022-04-28 MED ORDER — AZITHROMYCIN 250 MG PO TABS
250.0000 mg | ORAL_TABLET | Freq: Every day | ORAL | 0 refills | Status: DC
  Filled 2022-04-28: qty 6, 5d supply, fill #0

## 2022-04-28 MED FILL — Topiramate Tab 50 MG: ORAL | 30 days supply | Qty: 60 | Fill #3 | Status: AC
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
## 2022-04-28 NOTE — Discharge Instructions (Addendum)
Take the prednisone and Zithromax as directed.    Your COVID test is pending.    Take Tylenol or ibuprofen as needed for fever or discomfort.  Rest and keep yourself hydrated.    Follow-up with your primary care provider if your symptoms are not improving.

## 2022-04-28 NOTE — ED Triage Notes (Signed)
Patient to Urgent Care with complaints of chills, sore throat, post nasal drip, and green mucus x1 week. Cough is productive with green phlegm. Denies any known fevers.

## 2022-04-29 LAB — SARS CORONAVIRUS 2 (TAT 6-24 HRS): SARS Coronavirus 2: NEGATIVE

## 2022-05-09 ENCOUNTER — Other Ambulatory Visit: Payer: Self-pay

## 2022-05-10 ENCOUNTER — Other Ambulatory Visit: Payer: Self-pay

## 2022-05-15 ENCOUNTER — Other Ambulatory Visit: Payer: Self-pay

## 2022-05-16 ENCOUNTER — Telehealth: Payer: Self-pay | Admitting: Emergency Medicine

## 2022-05-16 NOTE — Telephone Encounter (Signed)
-----   Message from Elberta Fortis sent at 05/09/2022  2:54 PM EST ----- Regarding: Inhaler Patient called having a question about her abutoral inhale. Says it gives her the shakes. Please call patient back.
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
## 2022-05-23 ENCOUNTER — Emergency Department
Admission: EM | Admit: 2022-05-23 | Discharge: 2022-05-23 | Disposition: A | Discharge status: Home / Self Care | Payer: Self-pay | Attending: Emergency Medicine | Admitting: Emergency Medicine

## 2022-05-23 ENCOUNTER — Ambulatory Visit
Admission: EM | Admit: 2022-05-23 | Discharge: 2022-05-23 | Disposition: A | Discharge status: Home / Self Care | Payer: Self-pay

## 2022-05-23 ENCOUNTER — Encounter: Payer: Self-pay | Admitting: Emergency Medicine

## 2022-05-23 ENCOUNTER — Emergency Department: Payer: Self-pay

## 2022-05-23 ENCOUNTER — Other Ambulatory Visit: Payer: Self-pay

## 2022-05-23 DIAGNOSIS — R1084 Generalized abdominal pain: Secondary | ICD-10-CM

## 2022-05-23 DIAGNOSIS — K5730 Diverticulosis of large intestine without perforation or abscess without bleeding: Secondary | ICD-10-CM | POA: Insufficient documentation

## 2022-05-23 DIAGNOSIS — K5732 Diverticulitis of large intestine without perforation or abscess without bleeding: Secondary | ICD-10-CM

## 2022-05-23 LAB — CBC
HCT: 41.4 % (ref 36.0–46.0)
Hemoglobin: 13.6 g/dL (ref 12.0–15.0)
MCH: 32.9 pg (ref 26.0–34.0)
MCHC: 32.9 g/dL (ref 30.0–36.0)
MCV: 100 fL (ref 80.0–100.0)
Platelets: 357 10*3/uL (ref 150–400)
RBC: 4.14 MIL/uL (ref 3.87–5.11)
RDW: 12.9 % (ref 11.5–15.5)
WBC: 10.5 10*3/uL (ref 4.0–10.5)
nRBC: 0 % (ref 0.0–0.2)

## 2022-05-23 LAB — URINALYSIS, ROUTINE W REFLEX MICROSCOPIC
Bilirubin Urine: NEGATIVE
Glucose, UA: NEGATIVE mg/dL
Hgb urine dipstick: NEGATIVE
Ketones, ur: NEGATIVE mg/dL
Leukocytes,Ua: NEGATIVE
Nitrite: NEGATIVE
Protein, ur: NEGATIVE mg/dL
Specific Gravity, Urine: 1.015 (ref 1.005–1.030)
pH: 6 (ref 5.0–8.0)

## 2022-05-23 LAB — COMPREHENSIVE METABOLIC PANEL
ALT: 13 U/L (ref 0–44)
AST: 21 U/L (ref 15–41)
Albumin: 4 g/dL (ref 3.5–5.0)
Alkaline Phosphatase: 72 U/L (ref 38–126)
Anion gap: 7 (ref 5–15)
BUN: 16 mg/dL (ref 6–20)
CO2: 19 mmol/L — ABNORMAL LOW (ref 22–32)
Calcium: 8.9 mg/dL (ref 8.9–10.3)
Chloride: 112 mmol/L — ABNORMAL HIGH (ref 98–111)
Creatinine, Ser: 0.66 mg/dL (ref 0.44–1.00)
GFR, Estimated: >60 mL/min (ref >60)
Glucose, Bld: 84 mg/dL (ref 70–99)
Potassium: 3.8 mmol/L (ref 3.5–5.1)
Sodium: 138 mmol/L (ref 135–145)
Total Bilirubin: 0.7 mg/dL (ref 0.3–1.2)
Total Protein: 7.3 g/dL (ref 6.5–8.1)

## 2022-05-23 LAB — LIPASE, BLOOD: Lipase: 25 U/L (ref 11–51)

## 2022-05-23 MED ORDER — METRONIDAZOLE 500 MG PO TABS
500.0000 mg | ORAL_TABLET | Freq: Three times a day (TID) | ORAL | 0 refills | Status: AC
  Filled 2022-05-23: qty 21, 7d supply, fill #0

## 2022-05-23 MED ORDER — DICYCLOMINE HCL 10 MG PO CAPS
10.0000 mg | ORAL_CAPSULE | Freq: Once | ORAL | Status: AC
  Administered 2022-05-23: 10 mg via ORAL
  Filled 2022-05-23: qty 1

## 2022-05-23 MED ORDER — METRONIDAZOLE 500 MG/100ML IV SOLN: 500.0000 mg | Freq: Once | INTRAVENOUS | Status: DC

## 2022-05-23 MED ORDER — CIPROFLOXACIN HCL 500 MG PO TABS
500.0000 mg | ORAL_TABLET | Freq: Two times a day (BID) | ORAL | 0 refills | Status: AC
  Filled 2022-05-23: qty 14, 7d supply, fill #0

## 2022-05-23 MED ORDER — METRONIDAZOLE 500 MG PO TABS
500.0000 mg | ORAL_TABLET | Freq: Once | ORAL | Status: AC
  Administered 2022-05-23: 500 mg via ORAL
  Filled 2022-05-23: qty 1

## 2022-05-23 MED ORDER — DICYCLOMINE HCL 10 MG PO CAPS
10.0000 mg | ORAL_CAPSULE | Freq: Three times a day (TID) | ORAL | 0 refills | Status: DC
  Filled 2022-05-23: qty 20, 5d supply, fill #0

## 2022-05-23 MED ORDER — CIPROFLOXACIN HCL 500 MG PO TABS
500.0000 mg | ORAL_TABLET | Freq: Once | ORAL | Status: AC
  Administered 2022-05-23: 500 mg via ORAL
  Filled 2022-05-23: qty 1

## 2022-05-23 NOTE — ED Notes (Signed)
Pt Dc to home. Dc instructions reviewed with all questions answered. Pt voices understanding. Pt ambulatory out of dept with steady gait 

## 2022-05-23 NOTE — ED Provider Notes (Signed)
[facility] Provider Note   Event Date/Time   First MD Initiated Contact with Patient 05/23/22 1544     (approximate) History  Abdominal Pain  HPI Natasha Hanson is a 56 y.o. female with a past medical history of chronic pain syndrome, chronic low back pain, and chronic tremor who presents for generalized abdominal pain for the last 3 days without any associated nausea/vomiting/diarrhea.  Patient states that this pain is 10/10 and is associated with diarrhea.  Patient denies any recent travel or sick contacts. ROS: Patient currently denies any vision changes, tinnitus, difficulty speaking, facial droop, sore throat, chest pain, shortness of breath, abdominal pain, nausea/vomiting, dysuria, or weakness/numbness/paresthesias in any extremity   Physical Exam  Triage Vital Signs: ED Triage Vitals  Enc Vitals Group     BP 05/23/22 1318 138/68     Pulse Rate 05/23/22 1318 65     Resp 05/23/22 1318 18     Temp 05/23/22 1318 97.6 F (36.4 C)     Temp Source 05/23/22 1318 Oral     SpO2 05/23/22 1318 99 %     Weight 05/23/22 1814 119 lb 14.9 oz (54.4 kg)     Height 05/23/22 1814 5\' 3"  (1.6 m)     Head Circumference --      Peak Flow --      Pain Score 05/23/22 1305 10     Pain Loc --      Pain Edu? --      Excl. in [place]? --    Most recent vital signs: Vitals:   05/23/22 1814 05/23/22 2047  BP: 130/70 115/61  Pulse: 70 77  Resp: 18 18  Temp: 98 F (36.7 C) 98.2 F (36.8 C)  SpO2: 99% 99%   General: Awake, oriented x4. CV:  Good peripheral perfusion.  Resp:  Normal effort.  Abd:  No distention.  Left lower quadrant tenderness to palpation Other:  Middle-aged well-developed, well-nourished Caucasian female laying in bed in no acute distress ED Results / Procedures / Treatments  Labs (all labs ordered are listed, but only abnormal results are displayed) Labs Reviewed  COMPREHENSIVE METABOLIC PANEL - Abnormal; Notable for the following components:       Result Value   Chloride 112 (*)    CO2 19 (*)    All other components within normal limits  URINALYSIS, ROUTINE W REFLEX MICROSCOPIC - Abnormal; Notable for the following components:   Color, Urine YELLOW (*)    APPearance CLEAR (*)    All other components within normal limits  LIPASE, BLOOD  CBC   RADIOLOGY ED MD interpretation: CT of the abdomen and pelvis without IV contrast shows moderate area of colonic wall thickening and pericolonic edema involving the sigmoid colon suspicious for acute diverticulitis -Agree with radiology assessment Official radiology report(s): CT ABDOMEN PELVIS WO CONTRAST  Result Date: 05/23/2022 CLINICAL DATA:  Abdominal pain, acute, nonlocalized EXAM: CT ABDOMEN AND PELVIS WITHOUT CONTRAST TECHNIQUE: Multidetector CT imaging of the abdomen and pelvis was performed following the standard protocol without IV contrast. RADIATION DOSE REDUCTION: This exam was performed according to the departmental dose-optimization program which includes automated exposure control, adjustment of the mA and/or kV according to patient size and/or use of iterative reconstruction technique. COMPARISON:  CT 12/30/2019 FINDINGS: Lower chest: No basilar airspace disease or pleural effusion. Hepatobiliary: Unremarkable unenhanced appearance of the liver. Gallbladder physiologically distended, no calcified stone. No biliary dilatation. Pancreas: No ductal dilatation or inflammation. Spleen: Normal in size without  focal abnormality. Adrenals/Urinary Tract: Normal adrenal glands. No hydronephrosis or renal calculi. Small cyst in the mid lower left kidney. No further follow-up is recommended. No evidence of solid renal lesion. Minimally distended urinary bladder without wall thickening. Stomach/Bowel: Moderate area of colonic wall thickening and pericolonic edema involving the sigmoid colon. No perforation or abscess. Few scattered colonic diverticula in this region, but no focally inflamed colonic  diverticulum. Moderate volume of stool in the more proximal colon. The appendix is upper normal in caliber containing air and enteric contents, but no appendicitis or appendicolith. No small bowel obstruction or inflammatory change. Decompressed stomach. Vascular/Lymphatic: Aortic atherosclerosis without aneurysm. Scattered small retroperitoneal and central mesenteric nodes, not enlarged by size criteria. Reproductive: The uterus is surgically absent. Right ovary is prominent in size for patient's age a 2.6 x 2.2 cm, also seen on prior exams, presumably benign. Other: No free air, free fluid, or intra-abdominal fluid collection. Tiny fat containing umbilical hernia. Musculoskeletal: L4-L5 facet hypertrophy. There are no acute or suspicious osseous abnormalities. IMPRESSION: 1. Moderate area of colonic wall thickening and pericolonic edema involving the sigmoid colon, suspicious for acute diverticulitis. No perforation or abscess. Recommend follow-up colonoscopy after course of treatment to exclude underlying colonic neoplasm. 2. Occasional colonic diverticula without focal diverticulitis. Aortic Atherosclerosis (ICD10-I70.0). Electronically Signed   By: Narda Rutherford M.D.   On: 05/23/2022 19:27   PROCEDURES: Critical Care performed: No Procedures MEDICATIONS ORDERED IN ED: Medications  ciprofloxacin (CIPRO) tablet 500 mg (500 mg Oral Given 05/23/22 2042)  dicyclomine (BENTYL) capsule 10 mg (10 mg Oral Given 05/23/22 2042)  metroNIDAZOLE (FLAGYL) tablet 500 mg (500 mg Oral Given 05/23/22 2042)   IMPRESSION / MDM / ASSESSMENT AND PLAN / ED COURSE  I reviewed the triage vital signs and the nursing notes.                             Patient's presentation is most consistent with acute presentation with potential threat to life or bodily function. Patient has diverticulitis that is amenable to oral antibiotics.  There is a possibility as patient admitted to receptive anal sex prior to symptoms, that  this is simple mechanical proctocolitis however will still treat patient empirically with oral antibiotics and arrange follow-up with gastroenterology if symptoms persist Patient has no peritoneal signs or signs of perforation. Patients symptoms not typical for other emergent causes of abdominal pain such as, but not limited to, appendicitis, abdominal aortic aneurysm, surgical biliary disease, acute coronary syndrome, etc.  Patient will be discharged with strict return precautions and follow up with primary MD within 12-24 hours for further evaluation.  Patient understands that they may require admission and IV antibiotics and possibly surgery if they do not improve with oral antibiotics.   FINAL CLINICAL IMPRESSION(S) / ED DIAGNOSES   Final diagnoses:  Diverticulitis large intestine w/o perforation or abscess w/o bleeding  Generalized abdominal pain   Rx / DC Orders   ED Discharge Orders          Ordered    metroNIDAZOLE (FLAGYL) 500 MG tablet  3 times daily        05/23/22 2005    ciprofloxacin (CIPRO) 500 MG tablet  2 times daily        05/23/22 2005    dicyclomine (BENTYL) 10 MG capsule  3 times daily before meals & bedtime        05/23/22 2005  Note:  This document was prepared using Dragon voice recognition software and may include unintentional dictation errors.   Naaman Plummer, MD 05/23/22 [phone]

## 2022-05-23 NOTE — ED Triage Notes (Signed)
Pt comes with c/o belly pain all over since Sunday. Pt states no N/V/D.

## 2022-05-23 NOTE — ED Triage Notes (Signed)
Patient to Urgent Care with complaints of generalized abdominal pain she describes as severe and non-radiating that started on Sunday night. States that she had anal sex Sunday afternoon prior to onset of pain.   Reports hx of hysterectomy but no other abdominal surgeries.    Denies any NVD/ urinary symptoms. Reports she has a sore throat and chills. States that she has not had a known fever. Reports she believes she might be beginning to get the flu- states she has multiple family members who are flu positive. Has had her flu shot.

## 2022-05-23 NOTE — ED Provider Triage Note (Signed)
  Emergency Medicine Provider Triage Evaluation Note  Natasha Hanson , a 56 y.o.female,  was evaluated in triage.  Pt complains of diffuse abdominal pain x 3 days.  She states that she had intercourse this past Sunday and since then she has had significant abdominal and back pain that has failed to improve.  Denies any other symptoms at this time.   Review of Systems  Positive: Abdominal pain Negative: Denies fever, chest pain, vomiting  Physical Exam   Vitals:   05/23/22 1318  BP: 138/68  Pulse: 65  Resp: 18  Temp: 97.6 F (36.4 C)  SpO2: 99%   Gen:   Awake, appears uncomfortable. Resp:  Normal effort  MSK:   Moves extremities without difficulty  Other:  Diffuse abdominal tenderness.  Medical Decision Making  Given the patient's initial medical screening exam, the following diagnostic evaluation has been ordered. The patient will be placed in the appropriate treatment space, once one is available, to complete the evaluation and treatment. I have discussed the plan of care with the patient and I have advised the patient that an ED physician or mid-level practitioner will reevaluate their condition after the test results have been received, as the results may give them additional insight into the type of treatment they may need.    Diagnostics: Labs, abdominal CT, UA  Treatments: none immediately   Varney Daily, [place] 05/23/22 1329
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
## 2022-05-23 NOTE — Discharge Instructions (Signed)
Go to the emergency department for evaluation of your severe abdominal pain following anal penetration.

## 2022-05-24 ENCOUNTER — Other Ambulatory Visit: Payer: Self-pay | Admitting: Gerontology

## 2022-05-24 ENCOUNTER — Other Ambulatory Visit: Payer: Self-pay

## 2022-05-24 DIAGNOSIS — R251 Tremor, unspecified: Secondary | ICD-10-CM

## 2022-05-24 MED ORDER — TOPIRAMATE 50 MG PO TABS
50.0000 mg | ORAL_TABLET | Freq: Two times a day (BID) | ORAL | 1 refills | Status: DC
  Filled 2022-05-24: qty 60, 30d supply, fill #0
  Filled 2022-06-22: qty 60, 30d supply, fill #1

## 2022-06-14 ENCOUNTER — Encounter: Payer: Self-pay | Admitting: Gerontology

## 2022-06-14 ENCOUNTER — Ambulatory Visit: Payer: Self-pay | Admitting: Gerontology

## 2022-06-14 ENCOUNTER — Other Ambulatory Visit: Payer: Self-pay

## 2022-06-14 VITALS — BP 113/76 | HR 68 | Temp 98.4°F | Resp 16 | Ht 63.0 in | Wt 118.4 lb

## 2022-06-14 DIAGNOSIS — F17200 Nicotine dependence, unspecified, uncomplicated: Secondary | ICD-10-CM

## 2022-06-14 DIAGNOSIS — J3489 Other specified disorders of nose and nasal sinuses: Secondary | ICD-10-CM | POA: Insufficient documentation

## 2022-06-14 DIAGNOSIS — R051 Acute cough: Secondary | ICD-10-CM

## 2022-06-14 DIAGNOSIS — R0981 Nasal congestion: Secondary | ICD-10-CM

## 2022-06-14 DIAGNOSIS — R059 Cough, unspecified: Secondary | ICD-10-CM | POA: Insufficient documentation

## 2022-06-14 HISTORY — DX: Other specified disorders of nose and nasal sinuses: J34.89

## 2022-06-14 HISTORY — DX: Nasal congestion: R09.81

## 2022-06-14 MED ORDER — SALINE SPRAY 0.65 % NA SOLN
1.0000 | NASAL | 0 refills | Status: DC | PRN
  Filled 2022-06-14: qty 30, fill #0

## 2022-06-14 MED ORDER — FLUTICASONE PROPIONATE 50 MCG/ACT NA SUSP
2.0000 | Freq: Every day | NASAL | 0 refills | Status: DC
  Filled 2022-06-14: qty 16, 30d supply, fill #0

## 2022-06-14 MED ORDER — BENZONATATE 100 MG PO CAPS
100.0000 mg | ORAL_CAPSULE | Freq: Three times a day (TID) | ORAL | 0 refills | Status: DC | PRN
  Filled 2022-06-14: qty 20, 7d supply, fill #0

## 2022-06-14 NOTE — Progress Notes (Signed)
Established Patient Office Visit  Subjective   Patient ID: Natasha Hanson, female    DOB: [birth year]  Age: 57 y.o. MRN: [id number]  Chief Complaint  Patient presents with   Sore Throat    Patient c/o sore throat, PND, ear fullness, body aches x 2 days. Patient denies fever. Patient has not taken OTC medications. Patient has been around family and boyfriend that have had the flu. Patient has not taken a Covid test.    HPI  [name]  is a 57 y/o female who has history of Allergy, Arthritis, COPD, Tremors of nervous system neck presents for c/o sore throat, ear fullness, body aches, nasal congestion, rhinorrhea , post nasal drip and intermittent cough,  that started 2 days. She reports having sick contacts. She has not been tested for Flu nor Covid. She denies fever, shortness of breath, but endorses experiencing chills last night.  She was seen at the ED on 05/23/22 and was treated for Diverticulitis, states that symptoms has resolved and she offers no further symptoms.  Review of Systems  Constitutional:  Positive for chills.  HENT:  Positive for congestion, ear pain, sinus pain and sore throat.   Respiratory:  Positive for cough. Negative for shortness of breath and wheezing.   Cardiovascular: Negative.   Skin: Negative.   Neurological: Negative.   Psychiatric/Behavioral: Negative.        Objective:     BP 113/76 (BP Location: Left Arm, Patient Position: Sitting, Cuff Size: Normal)   Pulse 68   Temp 98.4 F (36.9 C) (Skin)   Resp 16   Ht _0  (1.6 m)   Wt 118 lb 6.4 oz (53.7 kg)   SpO2 98%   BMI 20.97 kg/m  BP Readings from Last 3 Encounters:  06/14/22 113/76  05/23/22 115/61  05/23/22 112/79   Wt Readings from Last 3 Encounters:  06/14/22 118 lb 6.4 oz (53.7 kg)  05/23/22 119 lb 14.9 oz (54.4 kg)  05/23/22 120 lb (54.4 kg)      Physical Exam HENT:     Head: Normocephalic and atraumatic.     Right Ear: Tympanic membrane and ear canal normal. No  tenderness. No middle ear effusion. Tympanic membrane is not erythematous.     Left Ear: Tympanic membrane and ear canal normal. No tenderness.  No middle ear effusion. Tympanic membrane is not erythematous.     Nose: No congestion or rhinorrhea.     Right Sinus: Maxillary sinus tenderness and frontal sinus tenderness present.     Left Sinus: Maxillary sinus tenderness and frontal sinus tenderness present.     Mouth/Throat:     Mouth: Mucous membranes are moist.     Pharynx: Oropharynx is clear. Uvula midline.  Eyes:     Conjunctiva/sclera: Conjunctivae normal.     Pupils: Pupils are equal, round, and reactive to light.  Cardiovascular:     Rate and Rhythm: Normal rate and regular rhythm.     Heart sounds: Normal heart sounds.  Pulmonary:     Effort: Pulmonary effort is normal.     Breath sounds: Normal breath sounds.  Musculoskeletal:     Cervical back: Normal range of motion.  Skin:    General: Skin is warm.     Capillary Refill: Capillary refill takes less than 2 seconds.  Neurological:     General: No focal deficit present.     Mental Status: She is alert and oriented to person, place, and time.  Psychiatric:  Mood and Affect: Mood normal.      No results found for any visits on 06/14/22.  Last CBC Lab Results  Component Value Date   WBC 10.5 05/23/2022   HGB 13.6 05/23/2022   HCT 41.4 05/23/2022   MCV 100.0 05/23/2022   MCH 32.9 05/23/2022   RDW 12.9 05/23/2022   PLT 357 79/55/8316   Last metabolic panel Lab Results  Component Value Date   GLUCOSE 84 05/23/2022   NA 138 05/23/2022   K 3.8 05/23/2022   CL 112 (H) 05/23/2022   CO2 19 (L) 05/23/2022   BUN 16 05/23/2022   CREATININE 0.66 05/23/2022   GFRNONAA >60 05/23/2022   CALCIUM 8.9 05/23/2022   PROT 7.3 05/23/2022   ALBUMIN 4.0 05/23/2022   LABGLOB 2.4 01/19/2021   AGRATIO 1.6 01/19/2021   BILITOT 0.7 05/23/2022   ALKPHOS 72 05/23/2022   AST 21 05/23/2022   ALT 13 05/23/2022   ANIONGAP 7  05/23/2022   Last lipids Lab Results  Component Value Date   CHOL 196 11/10/2021   HDL 41 11/10/2021   LDLCALC 127 (H) 11/10/2021   TRIG 154 (H) 11/10/2021   CHOLHDL 4.8 (H) 11/10/2021   Last hemoglobin A1c Lab Results  Component Value Date   HGBA1C 5.2 11/10/2021      The 10-year ASCVD risk score (Arnett DK, et al., 2019) is: 5.2%    Assessment & Plan:   1. Nasal congestion with rhinorrhea -  Symptoms likely viral in etiology, she was started on Flonase, educated on medication side effects and advised to notify clinic.  - fluticasone (FLONASE) 50 MCG/ACT nasal spray; Spray 2 sprays into both nostrils once daily.  Dispense: 16 g; Refill: 0  2. Sinus pain - She was started on Saline spray, educated on medication use, and to take Tylenol 650 mg every 12 hours for pain. She was advised to increase fluid intake. - sodium chloride (OCEAN) 0.65 % SOLN nasal spray; Place 1 spray into both nostrils daily as needed for congestion.  Dispense: 30 mL; Refill: 0  3. Acute cough - She was started on [employer], educated on medication side effects and advised to notify clinic, she was encouraged to increase fluid intake. She was provided with home Covid test kit and advised to notify clinic with result. She was advised to go to the ED and notify clinic for worsening symptoms. - benzonatate (TESSALON PERLES) 100 MG capsule; Take 1 capsule (100 mg total) by mouth 3 (three) times daily as needed.  Dispense: 20 capsule; Refill: 0  4. Smoking - She was advised to schedule CT screening. - CT CHEST LUNG CA SCREEN LOW DOSE W/O CM; Future   Return in about 5 weeks (around 07/19/2022), or if symptoms worsen or fail to improve.    Natasha Ines Jerold Coombe, NP

## 2022-06-14 NOTE — Patient Instructions (Signed)
Sore Throat When you have a sore throat, your throat may feel: Tender. Burning. Irritated. Scratchy. Painful when you swallow. Painful when you talk. Many things can cause a sore throat, such as: An infection. Allergies. Dry air. Smoke or pollution. Radiation treatment for cancer. Gastroesophageal reflux disease (GERD). A tumor. A sore throat can be the first sign of another sickness. It can happen with other problems, like: Coughing. Sneezing. Fever. Swelling of the glands in the neck. Most sore throats go away without treatment. Follow these instructions at home:     Medicines Take over-the-counter and prescription medicines only as told by your doctor. Children often get sore throats. Do not give your child aspirin. Use throat sprays to soothe your throat as told by your health care provider. Managing pain To help with pain: Sip warm liquids, such as broth, herbal tea, or warm water. Eat or drink cold or frozen liquids, such as frozen ice pops. Rinse your mouth (gargle) with a salt water mixture 3-4 times a day or as needed. To make salt water, dissolve -1 tsp (3-6 g) of salt in 1 cup (237 mL) of warm water. Do not swallow this mixture. Suck on hard candy or throat lozenges. Put a cool-mist humidifier in your bedroom at night. Sit in the bathroom with the door closed for 5-10 minutes while you run hot water in the shower. General instructions Do not smoke or use any products that contain nicotine or tobacco. If you need help quitting, ask your doctor. Get plenty of rest. Drink enough fluid to keep your pee (urine) pale yellow. Wash your hands often for at least 20 seconds with soap and water. If soap and water are not available, use hand sanitizer. Contact a doctor if: You have a fever for more than 2-3 days. You keep having symptoms for more than 2-3 days. Your throat does not get better in 7 days. You have a fever and your symptoms suddenly get worse. Your  child who is 3 months to 3 years old has a temperature of 102.2F (39C) or higher. Get help right away if: You have trouble breathing. You cannot swallow fluids, soft foods, or your spit. You have swelling in your throat or neck that gets worse. You feel like you may vomit (nauseous) and this feeling lasts a long time. You cannot stop vomiting. These symptoms may be an emergency. Get help right away. Call your local emergency services (911 in the [place] Do not wait to see if the symptoms will go away. Do not drive yourself to the hospital. Summary A sore throat is a painful, burning, irritated, or scratchy throat. Many things can cause a sore throat. Take over-the-counter medicines only as told by your doctor. Get plenty of rest. Drink enough fluid to keep your pee (urine) pale yellow. Contact a doctor if your symptoms get worse or your sore throat does not get better within 7 days. This information is not intended to replace advice given to you by your health care provider. Make sure you discuss any questions you have with your health care provider. Document Revised: 08/25/2020 Document Reviewed: 08/25/2020 Elsevier Patient Education  2023 [employer] Inc[employer]  

## 2022-06-15 ENCOUNTER — Other Ambulatory Visit: Payer: Self-pay

## 2022-06-22 ENCOUNTER — Other Ambulatory Visit: Payer: Self-pay

## 2022-06-26 ENCOUNTER — Other Ambulatory Visit: Payer: Self-pay

## 2022-06-26 MED ORDER — OSELTAMIVIR PHOSPHATE 75 MG PO CAPS: ORAL_CAPSULE | ORAL | 0 refills | Status: DC

## 2022-06-26 NOTE — Patient Instructions (Signed)
  ____________________________________________________________________________________________  Patient Information update  To: All of our patients.  Re: Name change.  It has been made official that our current name, "[facility]"   will soon be changed to "[facility]".   The purpose of this change is to eliminate any confusion created by the concept of our practice being a "Medication Management Pain Clinic". In the past this has led to the misconception that we treat pain primarily by the use of prescription medications.  Nothing can be farther from the truth.   Understanding PAIN MANAGEMENT: To further understand what our practice does, you first have to understand that "Pain Management" is a subspecialty that requires additional training once a physician has completed their specialty training, which can be in either Anesthesia, Neurology, Psychiatry, or Physical Medicine and Rehabilitation (PMR). Each one of these contributes to the final approach taken by each physician to the management of their patient's pain. To be a "Pain Management Specialist" you must have first completed one of the specialty trainings below.  Anesthesiologists - trained in clinical pharmacology and interventional techniques such as nerve blockade and regional as well as central neuroanatomy. They are trained to block pain before, during, and after surgical interventions.  Neurologists - trained in the diagnosis and pharmacological treatment of complex neurological conditions, such as Multiple Sclerosis, Parkinson's, spinal cord injuries, and other systemic conditions that may be associated with symptoms that may include but are not limited to pain. They tend to rely primarily on the treatment of chronic pain using prescription medications.  Psychiatrist - trained in conditions affecting the psychosocial  wellbeing of patients including but not limited to depression, anxiety, schizophrenia, personality disorders, addiction, and other substance use disorders that may be associated with chronic pain. They tend to rely primarily on the treatment of chronic pain using prescription medications.   Physical Medicine and Rehabilitation (PMR) physicians, also known as physiatrists - trained to treat a wide variety of medical conditions affecting the brain, spinal cord, nerves, bones, joints, ligaments, muscles, and tendons. Their training is primarily aimed at treating patients that have suffered injuries that have caused severe physical impairment. Their training is primarily aimed at the physical therapy and rehabilitation of those patients. They may also work alongside orthopedic surgeons or neurosurgeons using their expertise in assisting surgical patients to recover after their surgeries.  INTERVENTIONAL PAIN MANAGEMENT is sub-subspecialty of Pain Management.  Our physicians are Board-certified in Anesthesia, Pain Management, and Interventional Pain Management.  This meaning that not only have they been trained and Board-certified in their specialty of Anesthesia, and subspecialty of Pain Management, but they have also received further training in the sub-subspecialty of Interventional Pain Management, in order to become Board-certified as INTERVENTIONAL PAIN MANAGEMENT SPECIALIST.    Mission: Our goal is to use our skills in  INTERVENTIONAL PAIN MANAGEMENT as alternatives to the chronic use of prescription opioid medications for the treatment of pain. To make this more clear, we have changed our name to reflect what we do and offer. We will continue to offer medication management assessment and recommendations, but we will not be taking over any patient's medication management.  ____________________________________________________________________________________________     

## 2022-06-27 ENCOUNTER — Other Ambulatory Visit: Payer: Self-pay
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
## 2022-06-28 ENCOUNTER — Ambulatory Visit (HOSPITAL_BASED_OUTPATIENT_CLINIC_OR_DEPARTMENT_OTHER): Payer: Self-pay | Admitting: Pain Medicine

## 2022-06-28 DIAGNOSIS — M545 Low back pain, unspecified: Secondary | ICD-10-CM

## 2022-06-28 DIAGNOSIS — G894 Chronic pain syndrome: Secondary | ICD-10-CM

## 2022-06-28 DIAGNOSIS — Z91199 Patient's noncompliance with other medical treatment and regimen due to unspecified reason: Secondary | ICD-10-CM | POA: Insufficient documentation

## 2022-06-28 DIAGNOSIS — G8929 Other chronic pain: Secondary | ICD-10-CM

## 2022-06-29 ENCOUNTER — Other Ambulatory Visit: Payer: Self-pay | Admitting: Gerontology

## 2022-06-29 ENCOUNTER — Telehealth: Payer: Self-pay | Admitting: Emergency Medicine

## 2022-06-29 ENCOUNTER — Other Ambulatory Visit: Payer: Self-pay

## 2022-06-29 DIAGNOSIS — R051 Acute cough: Secondary | ICD-10-CM

## 2022-06-29 MED ORDER — GABAPENTIN 600 MG PO TABS
600.0000 mg | ORAL_TABLET | Freq: Three times a day (TID) | ORAL | 0 refills | Status: DC
  Filled 2022-06-29: qty 80, 27d supply, fill #0
  Filled 2022-06-29: qty 10, 3d supply, fill #0

## 2022-06-29 MED ORDER — BENZONATATE 100 MG PO CAPS
100.0000 mg | ORAL_CAPSULE | Freq: Three times a day (TID) | ORAL | 0 refills | Status: DC | PRN
  Filled 2022-06-29: qty 20, 7d supply, fill #0

## 2022-06-29 NOTE — Telephone Encounter (Signed)
Patient called requesting a refill on Tessalon Perles. Patient seen at [facility] ED on 06/25/21 and was diagnosed with the flu. They gave Tamiflu, but patient could not afford.  Discussed with Benjamine Mola, NP, she ok'd refill of Tessalon Perles. RX sent to [place]. Patient agreed and voiced understanding.

## 2022-07-06 ENCOUNTER — Ambulatory Visit: Payer: Self-pay | Admitting: Gerontology

## 2022-07-13 ENCOUNTER — Other Ambulatory Visit: Payer: Self-pay

## 2022-07-17 ENCOUNTER — Other Ambulatory Visit: Payer: Self-pay

## 2022-07-19 ENCOUNTER — Other Ambulatory Visit: Payer: Self-pay | Admitting: Emergency Medicine

## 2022-07-19 ENCOUNTER — Other Ambulatory Visit: Payer: Self-pay

## 2022-07-19 ENCOUNTER — Ambulatory Visit: Payer: Self-pay | Admitting: Gerontology

## 2022-07-19 DIAGNOSIS — R251 Tremor, unspecified: Secondary | ICD-10-CM

## 2022-07-19 MED ORDER — TOPIRAMATE 50 MG PO TABS
50.0000 mg | ORAL_TABLET | Freq: Two times a day (BID) | ORAL | 1 refills | Status: DC
  Filled 2022-07-19: qty 60, 30d supply, fill #0
  Filled 2022-08-28 – 2022-09-04 (×2): qty 60, 30d supply, fill #1

## 2022-07-25 ENCOUNTER — Ambulatory Visit: Payer: Self-pay | Admitting: Gerontology

## 2022-07-28 ENCOUNTER — Other Ambulatory Visit: Payer: Self-pay | Admitting: Gerontology

## 2022-07-28 ENCOUNTER — Other Ambulatory Visit: Payer: Self-pay

## 2022-08-01 ENCOUNTER — Other Ambulatory Visit: Payer: Self-pay

## 2022-08-01 MED ORDER — ALBUTEROL SULFATE HFA 108 (90 BASE) MCG/ACT IN AERS
INHALATION_SPRAY | RESPIRATORY_TRACT | 0 refills | Status: DC
  Filled 2022-08-01: qty 8.5, 16d supply, fill #0

## 2022-08-02 ENCOUNTER — Ambulatory Visit: Payer: Self-pay | Admitting: Gerontology

## 2022-08-03 ENCOUNTER — Other Ambulatory Visit: Payer: Self-pay

## 2022-08-10 ENCOUNTER — Other Ambulatory Visit: Payer: Self-pay

## 2022-08-10 ENCOUNTER — Encounter: Payer: Self-pay | Admitting: Gerontology

## 2022-08-10 ENCOUNTER — Ambulatory Visit: Payer: Self-pay | Admitting: Gerontology

## 2022-08-10 VITALS — BP 115/76 | HR 66 | Temp 97.9°F | Resp 16 | Ht 63.0 in | Wt 116.0 lb

## 2022-08-10 DIAGNOSIS — R051 Acute cough: Secondary | ICD-10-CM

## 2022-08-10 DIAGNOSIS — J3489 Other specified disorders of nose and nasal sinuses: Secondary | ICD-10-CM

## 2022-08-10 MED ORDER — BENZONATATE 100 MG PO CAPS
100.0000 mg | ORAL_CAPSULE | Freq: Three times a day (TID) | ORAL | 0 refills | Status: DC | PRN
  Filled 2022-08-10: qty 20, 7d supply, fill #0

## 2022-08-10 MED ORDER — SALINE SPRAY 0.65 % NA SOLN
1.0000 | NASAL | 0 refills | Status: DC | PRN
  Filled 2022-08-10: qty 30, fill #0

## 2022-08-10 MED ORDER — FLUTICASONE PROPIONATE 50 MCG/ACT NA SUSP
2.0000 | Freq: Every day | NASAL | 0 refills | Status: DC
  Filled 2022-08-10: qty 16, 30d supply, fill #0
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
## 2022-08-10 NOTE — Patient Instructions (Addendum)
Smoking Tobacco Information, Adult Smoking tobacco can be harmful to your health. Tobacco contains a toxic colorless chemical called nicotine. Nicotine causes changes in your brain that make you want more and more. This is called addiction. This can make it hard to stop smoking once you start. Tobacco also has other toxic chemicals that can hurt your body and raise your risk of many cancers. Menthol or "lite" tobacco or cigarette brands are not safer than regular brands. How can smoking tobacco affect me? Smoking tobacco puts you at risk for: Cancer. Smoking is most commonly associated with lung cancer, but can also lead to cancer in other parts of the body. Chronic obstructive pulmonary disease (COPD). This is a long-term lung condition that makes it hard to breathe. It also gets worse over time. High blood pressure (hypertension), heart disease, stroke, heart attack, and lung infections, such as pneumonia. Cataracts. This is when the lenses in the eyes become clouded. Digestive problems. This may include peptic ulcers, heartburn, and gastroesophageal reflux disease (GERD). Oral health problems, such as gum disease, mouth sores, and tooth loss. Loss of taste and smell. Smoking also affects how you look and smell. Smoking may cause: Wrinkles. Yellow or stained teeth, fingers, and fingernails. Bad breath. Bad-smelling clothes and hair. Smoking tobacco can also affect your social life, because: It may be challenging to find places to smoke when away from home. Many workplaces, [employer], hotels, and public places are tobacco-free. Smoking is expensive. This is due to the cost of tobacco and the long-term costs of treating health problems from smoking. Secondhand smoke may affect those around you. Secondhand smoke can cause lung cancer, breathing problems, and heart disease. Children of smokers have a higher risk for: Sudden infant death syndrome (SIDS). Ear infections. Lung infections. What  actions can I take to prevent health problems? Quit smoking  Do not start smoking. Quit if you already smoke. Do not replace cigarette smoking with vaping devices, such as e-cigarettes. Make a plan to quit smoking and commit to it. Look for programs to help you, and ask your health care provider for recommendations and ideas. Set a date and write down all the reasons you want to quit. Let your friends and family know you are quitting so they can help and support you. Consider finding friends who also want to quit. It can be easier to quit with someone else, so that you can support each other. Talk with your health care provider about using nicotine replacement medicines to help you quit. These include gum, lozenges, patches, sprays, or pills. If you try to quit but return to smoking, stay positive. It is common to slip up when you first quit, so take it one day at a time. Be prepared for cravings. When you feel the urge to smoke, chew gum or suck on hard candy. Lifestyle Stay busy. Take care of your body. Get plenty of exercise, eat a healthy diet, and drink plenty of water. Find ways to manage your stress, such as meditation, yoga, exercise, or time spent with friends and family. Ask your health care provider about having regular tests (screenings) to check for cancer. This may include blood tests, imaging tests, and other tests. Where to find support To get support to quit smoking, consider: Asking your health care provider for more information and resources. Joining a support group for people who want to quit smoking in your local community. There are many effective programs that may help you to quit. Calling the smokefree.gov counselor  helpline at 1-800-QUIT-NOW 864-094-6733). Where to find more information You may find more information about quitting smoking from: Centers for Disease Control and Prevention: https://www.chang-huffman.com/ https://hall.com/: smokefree.gov American Lung Association:  freedomfromsmoking.org Contact a health care provider if: You have problems breathing. Your lips, nose, or fingers turn blue. You have chest pain. You are coughing up blood. You feel like you will faint. You have other health changes that cause you to worry. Summary Smoking tobacco can negatively affect your health, the health of those around you, your finances, and your social life. Do not start smoking. Quit if you already smoke. If you need help quitting, ask your health care provider. Consider joining a support group for people in your local community who want to quit smoking. There are many effective programs that may help you to quit. This information is not intended to replace advice given to you by your health care provider. Make sure you discuss any questions you have with your health care provider. Document Revised: 05/24/2021 Document Reviewed: 05/24/2021 Elsevier Patient Education  [place] smoking cessation patient instructions here.

## 2022-08-17 ENCOUNTER — Other Ambulatory Visit: Payer: Self-pay

## 2022-08-28 ENCOUNTER — Other Ambulatory Visit: Payer: Self-pay | Admitting: Gerontology

## 2022-08-28 ENCOUNTER — Other Ambulatory Visit: Payer: Self-pay

## 2022-08-29 ENCOUNTER — Other Ambulatory Visit: Payer: Self-pay

## 2022-08-29 NOTE — Telephone Encounter (Signed)
Patient has insurance and has appt with [facility] on 09/01/22. Left message for patient that she will need to request from new PCP.

## 2022-09-01 ENCOUNTER — Telehealth: Payer: Self-pay

## 2022-09-01 ENCOUNTER — Ambulatory Visit (INDEPENDENT_AMBULATORY_CARE_PROVIDER_SITE_OTHER): Payer: Medicaid Other | Admitting: Family

## 2022-09-01 ENCOUNTER — Other Ambulatory Visit: Payer: Self-pay

## 2022-09-01 ENCOUNTER — Encounter: Payer: Self-pay | Admitting: Family

## 2022-09-01 VITALS — BP 132/82 | HR 65 | Ht 63.0 in | Wt 115.0 lb

## 2022-09-01 DIAGNOSIS — E538 Deficiency of other specified B group vitamins: Secondary | ICD-10-CM | POA: Diagnosis not present

## 2022-09-01 DIAGNOSIS — R7303 Prediabetes: Secondary | ICD-10-CM | POA: Diagnosis not present

## 2022-09-01 DIAGNOSIS — E559 Vitamin D deficiency, unspecified: Secondary | ICD-10-CM | POA: Diagnosis not present

## 2022-09-01 DIAGNOSIS — R5383 Other fatigue: Secondary | ICD-10-CM

## 2022-09-01 DIAGNOSIS — Z1231 Encounter for screening mammogram for malignant neoplasm of breast: Secondary | ICD-10-CM

## 2022-09-01 DIAGNOSIS — Z202 Contact with and (suspected) exposure to infections with a predominantly sexual mode of transmission: Secondary | ICD-10-CM

## 2022-09-01 DIAGNOSIS — N951 Menopausal and female climacteric states: Secondary | ICD-10-CM | POA: Diagnosis not present

## 2022-09-01 DIAGNOSIS — J324 Chronic pansinusitis: Secondary | ICD-10-CM

## 2022-09-01 DIAGNOSIS — Z1211 Encounter for screening for malignant neoplasm of colon: Secondary | ICD-10-CM

## 2022-09-01 DIAGNOSIS — E782 Mixed hyperlipidemia: Secondary | ICD-10-CM

## 2022-09-01 DIAGNOSIS — J301 Allergic rhinitis due to pollen: Secondary | ICD-10-CM

## 2022-09-01 DIAGNOSIS — Z122 Encounter for screening for malignant neoplasm of respiratory organs: Secondary | ICD-10-CM

## 2022-09-01 LAB — POCT URINALYSIS DIPSTICK
Bilirubin, UA: NEGATIVE
Blood, UA: NEGATIVE
Glucose, UA: NEGATIVE
Ketones, UA: NEGATIVE
Leukocytes, UA: NEGATIVE
Nitrite, UA: NEGATIVE
Protein, UA: NEGATIVE
Spec Grav, UA: 1.01 (ref 1.010–1.025)
Urobilinogen, UA: 0.2 E.U./dL
pH, UA: 6.5 (ref 5.0–8.0)

## 2022-09-01 MED ORDER — FAMOTIDINE 20 MG PO TABS
20.0000 mg | ORAL_TABLET | Freq: Every day | ORAL | 1 refills | Status: DC
  Filled 2022-09-01: qty 90, 90d supply, fill #0
  Filled 2023-02-16: qty 90, 90d supply, fill #1

## 2022-09-01 MED ORDER — OFLOXACIN 0.3 % OT SOLN
5.0000 [drp] | Freq: Every day | OTIC | 0 refills | Status: DC
  Filled 2022-09-01: qty 10, 14d supply, fill #0

## 2022-09-01 MED ORDER — AZITHROMYCIN 250 MG PO TABS
ORAL_TABLET | ORAL | 0 refills | Status: AC
  Filled 2022-09-01: qty 6, 5d supply, fill #0

## 2022-09-01 MED ORDER — FEXOFENADINE HCL 180 MG PO TABS
180.0000 mg | ORAL_TABLET | Freq: Every day | ORAL | 1 refills | Status: DC
  Filled 2022-09-01: qty 90, 90d supply, fill #0
  Filled 2022-09-01: qty 30, 30d supply, fill #0

## 2022-09-01 MED ORDER — GABAPENTIN 600 MG PO TABS
600.0000 mg | ORAL_TABLET | Freq: Three times a day (TID) | ORAL | 1 refills | Status: DC
  Filled 2022-09-01: qty 90, 30d supply, fill #0
  Filled 2022-10-06: qty 90, 30d supply, fill #1

## 2022-09-01 MED ORDER — NURTEC 75 MG PO TBDP
75.0000 mg | ORAL_TABLET | ORAL | 3 refills | Status: DC | PRN
  Filled 2022-09-01: qty 16, fill #0
  Filled 2022-09-01: qty 16, 30d supply, fill #0
  Filled 2023-02-16 – 2023-03-08 (×4): qty 16, 30d supply, fill #1

## 2022-09-01 NOTE — Telephone Encounter (Signed)
Patient LM asking for call back about her gabapentin, states it was not called in

## 2022-09-01 NOTE — Progress Notes (Unsigned)
New Patient Office Visit  Subjective    Patient ID: Natasha Hanson, female    DOB: 08-19-1965  Age: 57 y.o. MRN: [id number]  CC:  Chief Complaint  Patient presents with   Establish Care    New Patient    HPI Natasha Hanson presents to establish care Previous Primary Care provider/office:   Patient here with multiple concerns.  She needs labwork, and says that she also needs to update her preventative care.  She has not had a mammogram, colonoscopy, or pap smear.  She also needs a referral to ENT as she has chronic sinus infections.   She would also like to have her STD labwork checked as she does have 1 sexual partner, but she is unsure if she is his only partner.   She is a long-term smoker, and asks if we can do the CT low dose lung screening as well.   No toehr concerns today.     Outpatient Encounter Medications as of 09/01/2022  Medication Sig   azithromycin (ZITHROMAX) 250 MG tablet Take 2 tablets (500 mg total) by mouth daily for 1 day, THEN 1 tablet (250 mg total) daily for 4 days.   cetirizine (ZYRTEC) 10 MG tablet Take 1 tablet (10 mg total) by mouth once daily.   fexofenadine (ALLEGRA) 180 MG tablet Take 1 tablet (180 mg total) by mouth daily.   ofloxacin (FLOXIN) 0.3 % OTIC solution Place 5 drops into the left ear daily.   Rimegepant Sulfate (NURTEC) 75 MG TBDP Take 1 tablet (75 mg total) by mouth as needed.   topiramate (TOPAMAX) 50 MG tablet Take 1 tablet (50 mg total) by mouth 2 (two) times daily.   [DISCONTINUED] famotidine (PEPCID) 20 MG tablet Take 20 mg by mouth daily.   [DISCONTINUED] gabapentin (NEURONTIN) 600 MG tablet Take 1 tablet (600 mg total) by mouth 3 (three) times daily.   albuterol (VENTOLIN HFA) 108 (90 Base) MCG/ACT inhaler Inhale 2-4 puffs into the lungs every 4 (four) hours as needed for shortness of breath or tight cough   famotidine (PEPCID) 20 MG tablet Take 1 tablet (20 mg total) by mouth daily.   fluticasone furoate-vilanterol (BREO  ELLIPTA) 100-25 MCG/ACT AEPB Inhale one puff into the lungs once daily. Replace Advair   [DISCONTINUED] acetaminophen (TYLENOL) 325 MG tablet Take 650 mg by mouth every 6 (six) hours as needed.   [DISCONTINUED] albuterol (ACCUNEB) 1.25 MG/3ML nebulizer solution INHALE 1 VIAL (3ML TOTAL) VIA NEBULIZER  4 TIMES DAILY FOR SHORTNESS OF BREATH.   [DISCONTINUED] aspirin-acetaminophen-caffeine (EXCEDRIN MIGRAINE) 250-250-65 MG tablet Take 1 tablet by mouth every 6 (six) hours as needed for headache.   [DISCONTINUED] benzonatate (TESSALON PERLES) 100 MG capsule Take 1 capsule (100 mg total) by mouth 3 (three) times daily as needed.   [DISCONTINUED] fluticasone (FLONASE) 50 MCG/ACT nasal spray Spray 2 sprays into both nostrils once daily.   [DISCONTINUED] Nebulizer MISC Use the nebulizer with tubing four times a day   [DISCONTINUED] Respiratory Therapy Supplies (ADULT DISPOSABLE MOUTHPIECE) Mouthpiece MISC Use as directed with the nebulizer machine   [DISCONTINUED] sodium chloride (OCEAN) 0.65 % SOLN nasal spray Place 1 spray into both nostrils daily as needed for congestion.   [DISCONTINUED] Spacer/Aero-Holding Chambers (COMPACT SPACE CHAMBER) DEVI Use with inhaler as directed   No facility-administered encounter medications on file as of 09/01/2022.    Past Medical History:  Diagnosis Date   Allergy    Arthritis    COPD (chronic obstructive pulmonary disease) ([place])  Glaucoma    Tremors of nervous system    neck    Past Surgical History:  Procedure Laterality Date   ABDOMINAL HYSTERECTOMY     FOOT FUSION     FOOT SURGERY     REFRACTIVE SURGERY Left     Family History  Problem Relation Age of Onset   Cancer Mother    Other Mother        unkown COD   Anxiety disorder Mother    Cancer Father    Pneumonia Father    Other Maternal Grandmother        unkown medical history   Other Maternal Grandfather        unkown medical history   Other Paternal Grandmother        unkown medical  history   Other Paternal Grandfather        unkown medical history   Atrial fibrillation Daughter    Cancer Maternal Uncle    Cancer Maternal Uncle     Social History   Socioeconomic History   Marital status: Widowed    Spouse name: Not on file   Number of children: Not on file   Years of education: Not on file   Highest education level: Not on file  Occupational History   Not on file  Tobacco Use   Smoking status: Every Day    Packs/day: 1.00    Years: 40.00    Additional pack years: 0.00    Total pack years: 40.00    Types: Cigarettes   Smokeless tobacco: Never  Vaping Use   Vaping Use: Never used  Substance and Sexual Activity   Alcohol use: Yes    Comment: rarely   Drug use: Never   Sexual activity: Not Currently  Other Topics Concern   Not on file  Social History Narrative   Not on file   Social Determinants of Health   Financial Resource Strain: High Risk (04/06/2022)   Overall Financial Resource Strain (CARDIA)    Difficulty of Paying Living Expenses: Hard  Food Insecurity: Food Insecurity Present (02/09/2022)   Hunger Vital Sign    Worried About Running Out of Food in the Last Year: Sometimes true    Ran Out of Food in the Last Year: Sometimes true  Transportation Needs: No Transportation Needs (02/09/2022)   PRAPARE - Hydrologist (Medical): No    Lack of Transportation (Non-Medical): No  Physical Activity: Sufficiently Active (04/06/2022)   Exercise Vital Sign    Days of Exercise per Week: 7 days    Minutes of Exercise per Session: 60 min  Stress: Stress Concern Present (04/06/2022)   [place]    Feeling of Stress : Very much  Social Connections: Moderately Isolated (04/06/2022)   Social Connection and Isolation Panel [NHANES]    Frequency of Communication with Friends and Family: More than three times a week    Frequency of Social Gatherings with  Friends and Family: Never    Attends Religious Services: Never    Marine scientist or Organizations: No    Attends Archivist Meetings: Never    Marital Status: Living with partner  Intimate Partner Violence: Not At Risk (04/06/2022)   Humiliation, Afraid, Rape, and Kick questionnaire    Fear of Current or Ex-Partner: No    Emotionally Abused: No    Physically Abused: No    Sexually Abused: No    ROS  Objective    BP 132/82   Pulse 65   Ht 5\' 3"  (1.6 m)   Wt 115 lb (52.2 kg)   SpO2 92%   BMI 20.37 kg/m   Physical Exam     Assessment & Plan:   Problem List Items Addressed This Visit   None Visit Diagnoses     B12 deficiency due to diet    -  Primary   Relevant Orders   CBC With Differential   CMP14+EGFR   Vitamin B12   Vitamin D deficiency, unspecified       Relevant Orders   VITAMIN D 25 Hydroxy (Vit-D Deficiency, Fractures)   CBC With Differential   CMP14+EGFR   Prediabetes       Relevant Orders   CBC With Differential   CMP14+EGFR   Hemoglobin A1c   Other fatigue       Relevant Orders   CBC With Differential   CMP14+EGFR   TSH   Iron, TIBC and Ferritin Panel   POCT Urinalysis Dipstick (81002) (Completed)   Mixed hyperlipidemia       Relevant Orders   Lipid panel   CBC With Differential   CMP14+EGFR   Encounter for screening mammogram for malignant neoplasm of breast       Relevant Orders   MM 3D SCREENING MAMMOGRAM BILATERAL BREAST   CBC With Differential   CMP14+EGFR   Screening for malignant neoplasm of colon       Relevant Orders   Ambulatory referral to Gastroenterology   CBC With Differential   CMP14+EGFR   Symptomatic menopausal or female climacteric states       Relevant Orders   DG Bone Density   POCT Urinalysis Dipstick (81002) (Completed)   Screening for lung cancer       Relevant Orders   CT CHEST LUNG CANCER SCREENING LOW DOSE WO CONTRAST   Seasonal allergic rhinitis due to pollen       Relevant  Medications   fexofenadine (ALLEGRA) 180 MG tablet   Contact with and (suspected) exposure to infections with a predominantly sexual mode of transmission       Relevant Orders   HSV 1 and 2 Ab, IgG   RPR w/reflex to TrepSure   Acute Viral Hepatitis (HAV, HBV, HCV)   HIV Antibody (routine testing w rflx)   Chlamydia/Gonococcus/Trichomonas, NAA   Chronic pansinusitis       Relevant Medications   fexofenadine (ALLEGRA) 180 MG tablet   azithromycin (ZITHROMAX) 250 MG tablet   Other Relevant Orders   Ambulatory referral to ENT       Return in about 2 weeks (around 09/15/2022) for F/U.   Total time spent: 45 minutes  Mechele Claude, FNP  09/01/2022

## 2022-09-04 ENCOUNTER — Other Ambulatory Visit: Payer: Self-pay

## 2022-09-04 ENCOUNTER — Other Ambulatory Visit: Payer: Self-pay | Admitting: Family

## 2022-09-04 DIAGNOSIS — R251 Tremor, unspecified: Secondary | ICD-10-CM

## 2022-09-05 ENCOUNTER — Other Ambulatory Visit: Payer: Self-pay

## 2022-09-05 ENCOUNTER — Other Ambulatory Visit: Payer: Self-pay | Admitting: Family

## 2022-09-05 ENCOUNTER — Telehealth: Payer: Self-pay

## 2022-09-05 DIAGNOSIS — R251 Tremor, unspecified: Secondary | ICD-10-CM

## 2022-09-05 LAB — CHLAMYDIA/GONOCOCCUS/TRICHOMONAS, NAA
Chlamydia by NAA: NEGATIVE
Gonococcus by NAA: NEGATIVE
Trich vag by NAA: NEGATIVE

## 2022-09-05 NOTE — Telephone Encounter (Signed)
Pt called regarding wanting to get labs done tomorrow, I didn't know how to look at what you needed on pt but I knew it was quite a few tests. Will you put in lab order so she can come by tomorrow around 11? Please advise  *also mentioned she needs rx refill on topamax

## 2022-09-06 ENCOUNTER — Other Ambulatory Visit: Payer: Medicaid Other

## 2022-09-06 ENCOUNTER — Other Ambulatory Visit: Payer: Self-pay

## 2022-09-06 MED FILL — Topiramate Tab 50 MG: ORAL | 30 days supply | Qty: 60 | Fill #0 | Status: AC

## 2022-09-07 ENCOUNTER — Encounter: Payer: Self-pay | Admitting: Family

## 2022-09-07 ENCOUNTER — Other Ambulatory Visit: Payer: Self-pay

## 2022-09-08 ENCOUNTER — Other Ambulatory Visit
Admission: RE | Admit: 2022-09-08 | Discharge: 2022-09-08 | Disposition: A | Discharge status: Home / Self Care | Payer: Medicaid Other | Source: Ambulatory Visit | Attending: Family | Admitting: Family

## 2022-09-08 DIAGNOSIS — R5383 Other fatigue: Secondary | ICD-10-CM | POA: Diagnosis not present

## 2022-09-08 DIAGNOSIS — Z1211 Encounter for screening for malignant neoplasm of colon: Secondary | ICD-10-CM | POA: Insufficient documentation

## 2022-09-08 DIAGNOSIS — E782 Mixed hyperlipidemia: Secondary | ICD-10-CM | POA: Insufficient documentation

## 2022-09-08 DIAGNOSIS — E538 Deficiency of other specified B group vitamins: Secondary | ICD-10-CM | POA: Diagnosis present

## 2022-09-08 DIAGNOSIS — E559 Vitamin D deficiency, unspecified: Secondary | ICD-10-CM | POA: Insufficient documentation

## 2022-09-08 DIAGNOSIS — R7303 Prediabetes: Secondary | ICD-10-CM | POA: Insufficient documentation

## 2022-09-08 LAB — LIPID PANEL
Cholesterol: 185 mg/dL (ref 0–200)
HDL: 43 mg/dL (ref >40)
LDL Cholesterol: 104 mg/dL — ABNORMAL HIGH (ref 0–99)
Total CHOL/HDL Ratio: 4.3 RATIO
Triglycerides: 191 mg/dL — ABNORMAL HIGH (ref <150)
VLDL: 38 mg/dL (ref 0–40)

## 2022-09-08 LAB — COMPREHENSIVE METABOLIC PANEL
ALT: 10 U/L (ref 0–44)
AST: 21 U/L (ref 15–41)
Albumin: 4 g/dL (ref 3.5–5.0)
Alkaline Phosphatase: 70 U/L (ref 38–126)
Anion gap: 12 (ref 5–15)
BUN: 10 mg/dL (ref 6–20)
CO2: 23 mmol/L (ref 22–32)
Calcium: 9.3 mg/dL (ref 8.9–10.3)
Chloride: 104 mmol/L (ref 98–111)
Creatinine, Ser: 0.72 mg/dL (ref 0.44–1.00)
GFR, Estimated: >60 mL/min (ref >60)
Glucose, Bld: 80 mg/dL (ref 70–99)
Potassium: 4.1 mmol/L (ref 3.5–5.1)
Sodium: 139 mmol/L (ref 135–145)
Total Bilirubin: 0.5 mg/dL (ref 0.3–1.2)
Total Protein: 7.1 g/dL (ref 6.5–8.1)

## 2022-09-08 LAB — VITAMIN B12: Vitamin B-12: 191 pg/mL (ref 180–914)

## 2022-09-08 LAB — IRON AND TIBC
Iron: 76 ug/dL (ref 28–170)
Saturation Ratios: 19 % (ref 10.4–31.8)
TIBC: 393 ug/dL (ref 250–450)
UIBC: 317 ug/dL

## 2022-09-08 LAB — CBC WITH DIFFERENTIAL/PLATELET
Abs Immature Granulocytes: 0.02 10*3/uL (ref 0.00–0.07)
Basophils Absolute: 0 10*3/uL (ref 0.0–0.1)
Basophils Relative: 1 %
Eosinophils Absolute: 0.1 10*3/uL (ref 0.0–0.5)
Eosinophils Relative: 2 %
HCT: 40.1 % (ref 36.0–46.0)
Hemoglobin: 13.4 g/dL (ref 12.0–15.0)
Immature Granulocytes: 0 %
Lymphocytes Relative: 50 %
Lymphs Abs: 4 10*3/uL (ref 0.7–4.0)
MCH: 33.3 pg (ref 26.0–34.0)
MCHC: 33.4 g/dL (ref 30.0–36.0)
MCV: 99.5 fL (ref 80.0–100.0)
Monocytes Absolute: 0.6 10*3/uL (ref 0.1–1.0)
Monocytes Relative: 7 %
Neutro Abs: 3.2 10*3/uL (ref 1.7–7.7)
Neutrophils Relative %: 40 %
Platelets: 358 10*3/uL (ref 150–400)
RBC: 4.03 MIL/uL (ref 3.87–5.11)
RDW: 13.2 % (ref 11.5–15.5)
WBC: 8 10*3/uL (ref 4.0–10.5)
nRBC: 0 % (ref 0.0–0.2)

## 2022-09-08 LAB — TSH: TSH: 1.437 u[IU]/mL (ref 0.350–4.500)

## 2022-09-08 LAB — VITAMIN D 25 HYDROXY (VIT D DEFICIENCY, FRACTURES): Vit D, 25-Hydroxy: 32.8 ng/mL (ref 30–100)

## 2022-09-08 LAB — HEMOGLOBIN A1C
Hgb A1c MFr Bld: 5.4 % (ref 4.8–5.6)
Mean Plasma Glucose: 108 mg/dL

## 2022-09-08 LAB — FERRITIN: Ferritin: 24 ng/mL (ref 11–307)

## 2022-09-11 ENCOUNTER — Other Ambulatory Visit
Admission: RE | Admit: 2022-09-11 | Discharge: 2022-09-11 | Disposition: A | Discharge status: Home / Self Care | Payer: Medicaid Other | Attending: Nurse Practitioner | Admitting: Nurse Practitioner

## 2022-09-11 DIAGNOSIS — Z202 Contact with and (suspected) exposure to infections with a predominantly sexual mode of transmission: Secondary | ICD-10-CM | POA: Insufficient documentation

## 2022-09-11 LAB — HIV ANTIBODY (ROUTINE TESTING W REFLEX): HIV Screen 4th Generation wRfx: NONREACTIVE

## 2022-09-11 LAB — HEPATITIS PANEL, ACUTE
HCV Ab: NONREACTIVE
Hep A IgM: NONREACTIVE
Hep B C IgM: NONREACTIVE
Hepatitis B Surface Ag: NONREACTIVE

## 2022-09-12 LAB — HSV(HERPES SIMPLEX VRS) I + II AB-IGG
HSV 1 Glycoprotein G Ab, IgG: 20.3 index — ABNORMAL HIGH (ref 0.00–0.90)
HSV 2 Glycoprotein G Ab, IgG: <0.91 index (ref 0.00–0.90)

## 2022-09-12 LAB — RPR: RPR Ser Ql: NONREACTIVE

## 2022-09-14 ENCOUNTER — Ambulatory Visit (INDEPENDENT_AMBULATORY_CARE_PROVIDER_SITE_OTHER): Payer: Medicaid Other

## 2022-09-14 DIAGNOSIS — Z122 Encounter for screening for malignant neoplasm of respiratory organs: Secondary | ICD-10-CM

## 2022-09-14 DIAGNOSIS — F17219 Nicotine dependence, cigarettes, with unspecified nicotine-induced disorders: Secondary | ICD-10-CM

## 2022-09-15 ENCOUNTER — Ambulatory Visit: Payer: Medicaid Other | Admitting: Family

## 2022-09-15 VITALS — BP 112/68 | Ht 63.0 in | Wt 116.2 lb

## 2022-09-15 DIAGNOSIS — M4802 Spinal stenosis, cervical region: Secondary | ICD-10-CM

## 2022-09-15 DIAGNOSIS — E538 Deficiency of other specified B group vitamins: Secondary | ICD-10-CM | POA: Diagnosis not present

## 2022-09-15 DIAGNOSIS — J301 Allergic rhinitis due to pollen: Secondary | ICD-10-CM | POA: Diagnosis not present

## 2022-09-15 DIAGNOSIS — M47812 Spondylosis without myelopathy or radiculopathy, cervical region: Secondary | ICD-10-CM

## 2022-09-15 DIAGNOSIS — R7303 Prediabetes: Secondary | ICD-10-CM | POA: Diagnosis not present

## 2022-09-15 DIAGNOSIS — M47816 Spondylosis without myelopathy or radiculopathy, lumbar region: Secondary | ICD-10-CM

## 2022-09-15 DIAGNOSIS — E782 Mixed hyperlipidemia: Secondary | ICD-10-CM | POA: Diagnosis not present

## 2022-09-15 MED ORDER — CYANOCOBALAMIN 1000 MCG/ML IJ SOLN
1000.0000 ug | Freq: Once | INTRAMUSCULAR | Status: AC
  Administered 2022-09-15: 1000 ug via INTRAMUSCULAR

## 2022-09-16 ENCOUNTER — Encounter: Payer: Self-pay | Admitting: Family

## 2022-09-16 DIAGNOSIS — J301 Allergic rhinitis due to pollen: Secondary | ICD-10-CM | POA: Insufficient documentation

## 2022-09-16 DIAGNOSIS — E782 Mixed hyperlipidemia: Secondary | ICD-10-CM | POA: Insufficient documentation

## 2022-09-16 DIAGNOSIS — E538 Deficiency of other specified B group vitamins: Secondary | ICD-10-CM | POA: Insufficient documentation
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
## 2022-09-18 ENCOUNTER — Telehealth: Payer: Self-pay

## 2022-09-18 NOTE — Telephone Encounter (Signed)
Pt called and left vm regarding getting b12 shot Friday, & asked if this would also help her eating problem? Please advise

## 2022-09-19 ENCOUNTER — Other Ambulatory Visit: Payer: Self-pay

## 2022-09-21 ENCOUNTER — Encounter: Payer: Self-pay | Admitting: Family

## 2022-09-23 ENCOUNTER — Encounter: Payer: Self-pay | Admitting: Family

## 2022-09-26 ENCOUNTER — Ambulatory Visit (INDEPENDENT_AMBULATORY_CARE_PROVIDER_SITE_OTHER): Payer: Medicaid Other

## 2022-09-26 DIAGNOSIS — N951 Menopausal and female climacteric states: Secondary | ICD-10-CM | POA: Diagnosis not present

## 2022-09-26 DIAGNOSIS — M810 Age-related osteoporosis without current pathological fracture: Secondary | ICD-10-CM

## 2022-09-29 ENCOUNTER — Other Ambulatory Visit: Payer: Self-pay

## 2022-09-29 ENCOUNTER — Ambulatory Visit: Payer: Medicaid Other

## 2022-09-29 DIAGNOSIS — M810 Age-related osteoporosis without current pathological fracture: Secondary | ICD-10-CM | POA: Insufficient documentation

## 2022-09-29 MED ORDER — ALENDRONATE SODIUM 70 MG PO TABS
70.0000 mg | ORAL_TABLET | ORAL | 11 refills | Status: DC
  Filled 2022-09-29: qty 4, 28d supply, fill #0

## 2022-09-29 NOTE — Progress Notes (Signed)
Patient notified

## 2022-10-03 ENCOUNTER — Telehealth: Payer: Self-pay | Admitting: Family

## 2022-10-03 NOTE — Telephone Encounter (Signed)
Patient called in stating that her ears are clogged up and nose is running. She says she mentioned this when she came for her B12 shot last week but I dont see it documented. She is wanting an antibiotic. And she also said she has something in the corner of her lip that you were supposed to send her in medicine for.   [facility]

## 2022-10-03 NOTE — Telephone Encounter (Signed)
Discussed at follow up

## 2022-10-04 ENCOUNTER — Other Ambulatory Visit: Payer: Self-pay

## 2022-10-04 MED FILL — Topiramate Tab 50 MG: ORAL | 30 days supply | Qty: 60 | Fill #1 | Status: AC

## 2022-10-05 ENCOUNTER — Other Ambulatory Visit: Payer: Self-pay

## 2022-10-05 MED ORDER — VALACYCLOVIR HCL 500 MG PO TABS
500.0000 mg | ORAL_TABLET | Freq: Two times a day (BID) | ORAL | 0 refills | Status: DC
  Filled 2022-10-05: qty 10, 5d supply, fill #0

## 2022-10-05 MED ORDER — AMOXICILLIN-POT CLAVULANATE 875-125 MG PO TABS
1.0000 | ORAL_TABLET | Freq: Two times a day (BID) | ORAL | 0 refills | Status: DC
  Filled 2022-10-05: qty 20, 10d supply, fill #0

## 2022-10-05 NOTE — Telephone Encounter (Signed)
Orders were sent to the pharmacy for her.   Plesae let her know

## 2022-10-10 ENCOUNTER — Other Ambulatory Visit: Payer: Self-pay

## 2022-10-10 ENCOUNTER — Other Ambulatory Visit: Payer: Self-pay | Admitting: Family

## 2022-10-10 MED ORDER — CYANOCOBALAMIN 1000 MCG/ML IJ SOLN
1000.0000 ug | INTRAMUSCULAR | 0 refills | Status: AC
  Filled 2022-10-10 – 2022-10-12 (×2): qty 2, 28d supply, fill #0
  Filled 2022-11-10: qty 2, 28d supply, fill #1
  Filled 2023-05-30: qty 2, 28d supply, fill #2

## 2022-10-12 ENCOUNTER — Other Ambulatory Visit: Payer: Self-pay

## 2022-10-12 MED ORDER — FLUTICASONE PROPIONATE 50 MCG/ACT NA SUSP
2.0000 | Freq: Every day | NASAL | 12 refills | Status: DC
  Filled 2022-10-12: qty 16, 30d supply, fill #0
  Filled 2023-02-16: qty 16, 30d supply, fill #1
  Filled 2023-09-13: qty 16, 30d supply, fill #2

## 2022-10-13 ENCOUNTER — Ambulatory Visit (INDEPENDENT_AMBULATORY_CARE_PROVIDER_SITE_OTHER): Payer: Medicaid Other | Admitting: Family

## 2022-10-13 ENCOUNTER — Telehealth: Payer: Self-pay

## 2022-10-13 DIAGNOSIS — E538 Deficiency of other specified B group vitamins: Secondary | ICD-10-CM

## 2022-10-13 MED ORDER — CYANOCOBALAMIN 1000 MCG/ML IJ SOLN
1000.0000 ug | Freq: Once | INTRAMUSCULAR | Status: AC
  Administered 2022-10-13: 1000 ug via INTRAMUSCULAR

## 2022-10-13 NOTE — Telephone Encounter (Signed)
Notified patient today that due to the pasteurization process, this is unlikely to present any health hazards.

## 2022-10-13 NOTE — Telephone Encounter (Signed)
When pt came in for B12 injection today, she mentioned that she has had on and off lower back pain mainly on right side- said she has been taking naproxen & tylenol arthritis for it. [place] Spaniel it helps sometimes but she can still notice the pain, she wanted me to let you know about this

## 2022-10-15 NOTE — Progress Notes (Signed)
   CHIEF COMPLAINT  B12 Shot     REASON FOR VISIT  B12 Injection     ASSESSMENT  B12 Deficiency, Unspecified     PLAN  Diagnoses and all orders for this visit:  B12 deficiency due to diet -     cyanocobalamin (VITAMIN B12) injection 1,000 mcg     Pt. given B12 injection in clinic.  Return for next injection per provider instructions.   Total time spent: 10 minutes  Miki Kins, FNP  10/13/2022

## 2022-10-17 ENCOUNTER — Other Ambulatory Visit: Payer: Self-pay | Admitting: Otolaryngology

## 2022-10-17 DIAGNOSIS — R1310 Dysphagia, unspecified: Secondary | ICD-10-CM

## 2022-10-19 ENCOUNTER — Ambulatory Visit: Payer: Medicaid Other | Admitting: Nurse Practitioner

## 2022-10-20 ENCOUNTER — Other Ambulatory Visit: Payer: Self-pay

## 2022-10-25 ENCOUNTER — Ambulatory Visit
Admission: RE | Admit: 2022-10-25 | Discharge: 2022-10-25 | Disposition: A | Discharge status: Home / Self Care | Payer: Medicaid Other | Source: Ambulatory Visit | Attending: Family | Admitting: Family

## 2022-10-25 ENCOUNTER — Other Ambulatory Visit: Payer: Self-pay

## 2022-10-25 DIAGNOSIS — Z1231 Encounter for screening mammogram for malignant neoplasm of breast: Secondary | ICD-10-CM | POA: Insufficient documentation

## 2022-10-25 DIAGNOSIS — R1310 Dysphagia, unspecified: Secondary | ICD-10-CM | POA: Diagnosis present

## 2022-10-26 ENCOUNTER — Ambulatory Visit
Admission: RE | Admit: 2022-10-26 | Discharge: 2022-10-26 | Disposition: A | Discharge status: Home / Self Care | Payer: Medicaid Other | Source: Ambulatory Visit | Attending: Otolaryngology | Admitting: Otolaryngology

## 2022-10-26 DIAGNOSIS — R1310 Dysphagia, unspecified: Secondary | ICD-10-CM

## 2022-10-27 ENCOUNTER — Ambulatory Visit (INDEPENDENT_AMBULATORY_CARE_PROVIDER_SITE_OTHER): Payer: Medicaid Other | Admitting: Family

## 2022-10-27 ENCOUNTER — Encounter: Payer: Self-pay | Admitting: Family

## 2022-10-27 DIAGNOSIS — E538 Deficiency of other specified B group vitamins: Secondary | ICD-10-CM

## 2022-10-27 MED ORDER — CYANOCOBALAMIN 1000 MCG/ML IJ SOLN
1000.0000 ug | Freq: Once | INTRAMUSCULAR | Status: AC
  Administered 2022-10-27: 1000 ug via INTRAMUSCULAR

## 2022-10-27 NOTE — Progress Notes (Signed)
   CHIEF COMPLAINT  B12 Shot     REASON FOR VISIT  B12 Injection     ASSESSMENT  B12 Deficiency, Unspecified     PLAN  Diagnoses and all orders for this visit:  B12 deficiency due to diet -     cyanocobalamin (VITAMIN B12) injection 1,000 mcg     Pt. given B12 injection in clinic.  Return for next injection per provider instructions.   Total time spent: 10 minutes  Miki Kins, FNP  10/27/2022

## 2022-10-30 ENCOUNTER — Ambulatory Visit (INDEPENDENT_AMBULATORY_CARE_PROVIDER_SITE_OTHER): Payer: Medicaid Other | Admitting: Family

## 2022-10-30 ENCOUNTER — Other Ambulatory Visit: Payer: Self-pay

## 2022-10-30 ENCOUNTER — Ambulatory Visit
Admission: RE | Admit: 2022-10-30 | Discharge: 2022-10-30 | Disposition: A | Discharge status: Home / Self Care | Payer: Medicaid Other | Attending: Family | Admitting: Family

## 2022-10-30 ENCOUNTER — Ambulatory Visit
Admission: RE | Admit: 2022-10-30 | Discharge: 2022-10-30 | Disposition: A | Discharge status: Home / Self Care | Payer: Medicaid Other | Source: Ambulatory Visit | Attending: Family | Admitting: Family

## 2022-10-30 ENCOUNTER — Encounter: Payer: Self-pay | Admitting: Family

## 2022-10-30 VITALS — BP 118/70 | HR 63 | Ht 63.0 in | Wt 114.6 lb

## 2022-10-30 DIAGNOSIS — M25522 Pain in left elbow: Secondary | ICD-10-CM | POA: Diagnosis present

## 2022-10-30 DIAGNOSIS — R252 Cramp and spasm: Secondary | ICD-10-CM | POA: Diagnosis not present

## 2022-10-30 MED ORDER — TIZANIDINE HCL 2 MG PO TABS
2.0000 mg | ORAL_TABLET | Freq: Four times a day (QID) | ORAL | 0 refills | Status: DC | PRN
  Filled 2022-10-30: qty 120, 30d supply, fill #0

## 2022-10-30 MED ORDER — CICLOPIROX 8 % EX SOLN
1.0000 | Freq: Every day | CUTANEOUS | 1 refills | Status: DC
  Filled 2022-10-30: qty 6.6, 20d supply, fill #0

## 2022-10-31 ENCOUNTER — Other Ambulatory Visit: Payer: Self-pay

## 2022-11-01 ENCOUNTER — Telehealth: Payer: Self-pay | Admitting: Family

## 2022-11-01 NOTE — Telephone Encounter (Signed)
Patient left VM asking if this anti-fungal for her nails should go on all nails or just certain ones? Please advise.
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
## 2022-11-05 ENCOUNTER — Encounter: Payer: Self-pay | Admitting: Family

## 2022-11-05 NOTE — Addendum Note (Signed)
Addended by: Grayling Congress on: 11/05/2022 03:13 PM   Modules accepted: Orders

## 2022-11-06 ENCOUNTER — Telehealth: Payer: Self-pay | Admitting: Family

## 2022-11-06 NOTE — Telephone Encounter (Signed)
Entered in error

## 2022-11-09 ENCOUNTER — Telehealth: Payer: Self-pay

## 2022-11-09 ENCOUNTER — Telehealth: Payer: Self-pay | Admitting: Family

## 2022-11-09 ENCOUNTER — Other Ambulatory Visit: Payer: Self-pay | Admitting: Family

## 2022-11-09 ENCOUNTER — Other Ambulatory Visit: Payer: Self-pay

## 2022-11-09 DIAGNOSIS — R251 Tremor, unspecified: Secondary | ICD-10-CM

## 2022-11-09 NOTE — Telephone Encounter (Signed)
Pt called and left vm regarding rx muscle relaxer, said it helps but only lasts for a few hours then wears off. She asked if you can up the dose of rx? Please advise

## 2022-11-10 ENCOUNTER — Ambulatory Visit (INDEPENDENT_AMBULATORY_CARE_PROVIDER_SITE_OTHER): Payer: Medicaid Other | Admitting: Family

## 2022-11-10 ENCOUNTER — Other Ambulatory Visit: Payer: Self-pay

## 2022-11-10 ENCOUNTER — Other Ambulatory Visit: Payer: Self-pay | Admitting: Family

## 2022-11-10 DIAGNOSIS — E538 Deficiency of other specified B group vitamins: Secondary | ICD-10-CM

## 2022-11-10 MED ORDER — CYANOCOBALAMIN 1000 MCG/ML IJ SOLN
1000.0000 ug | Freq: Once | INTRAMUSCULAR | Status: AC
Start: 2022-11-10 — End: 2022-11-10
  Administered 2022-11-10: 1000 ug via INTRAMUSCULAR

## 2022-11-10 MED FILL — Topiramate Tab 50 MG: ORAL | 30 days supply | Qty: 60 | Fill #0 | Status: AC

## 2022-11-10 MED FILL — Gabapentin Tab 600 MG: ORAL | 30 days supply | Qty: 90 | Fill #0 | Status: AC

## 2022-11-12 NOTE — Progress Notes (Signed)
   CHIEF COMPLAINT  B12 Shot     REASON FOR VISIT  B12 Injection     ASSESSMENT  B12 Deficiency, Unspecified     PLAN  Diagnoses and all orders for this visit:  B12 deficiency due to diet -     cyanocobalamin (VITAMIN B12) injection 1,000 mcg     Pt. given B12 injection in clinic.  Return for next injection per provider instructions.   Total time spent: 10 minutes  Miki Kins, FNP  11/10/2022

## 2022-11-13 ENCOUNTER — Other Ambulatory Visit: Payer: Self-pay

## 2022-11-15 ENCOUNTER — Other Ambulatory Visit: Payer: Self-pay

## 2022-11-15 MED ORDER — TIZANIDINE HCL 4 MG PO TABS
4.0000 mg | ORAL_TABLET | Freq: Four times a day (QID) | ORAL | 3 refills | Status: DC | PRN
  Filled 2022-11-15 – 2022-12-11 (×2): qty 120, 30d supply, fill #0
  Filled 2023-01-11: qty 120, 30d supply, fill #1
  Filled 2023-02-16 (×2): qty 120, 30d supply, fill #2
  Filled 2023-04-13: qty 120, 30d supply, fill #3

## 2022-11-15 NOTE — Addendum Note (Signed)
Addended by: Grayling Congress on: 11/15/2022 03:26 PM   Modules accepted: Orders

## 2022-11-16 ENCOUNTER — Ambulatory Visit: Admission: RE | Admit: 2022-11-16 | Payer: Medicaid Other | Source: Ambulatory Visit

## 2022-11-22 ENCOUNTER — Other Ambulatory Visit: Payer: Self-pay

## 2022-11-28 ENCOUNTER — Telehealth: Payer: Self-pay | Admitting: Family

## 2022-11-28 NOTE — Telephone Encounter (Signed)
Patient left VM that she needs an Rx for her protein shakes. Please advise.

## 2022-12-01 ENCOUNTER — Other Ambulatory Visit: Payer: Self-pay

## 2022-12-11 ENCOUNTER — Other Ambulatory Visit: Payer: Self-pay

## 2022-12-11 MED FILL — Gabapentin Tab 600 MG: ORAL | 30 days supply | Qty: 90 | Fill #1 | Status: AC

## 2022-12-11 MED FILL — Topiramate Tab 50 MG: ORAL | 30 days supply | Qty: 60 | Fill #1 | Status: AC

## 2022-12-18 ENCOUNTER — Other Ambulatory Visit: Payer: Self-pay

## 2022-12-18 ENCOUNTER — Ambulatory Visit (INDEPENDENT_AMBULATORY_CARE_PROVIDER_SITE_OTHER): Payer: Medicaid Other | Admitting: Family

## 2022-12-18 ENCOUNTER — Encounter: Payer: Self-pay | Admitting: Family

## 2022-12-18 ENCOUNTER — Other Ambulatory Visit
Admission: RE | Admit: 2022-12-18 | Discharge: 2022-12-18 | Disposition: A | Discharge status: Home / Self Care | Payer: Medicaid Other | Source: Ambulatory Visit | Attending: Family | Admitting: Family

## 2022-12-18 VITALS — BP 128/72 | HR 55 | Ht 63.0 in | Wt 113.4 lb

## 2022-12-18 DIAGNOSIS — R7303 Prediabetes: Secondary | ICD-10-CM | POA: Insufficient documentation

## 2022-12-18 DIAGNOSIS — E538 Deficiency of other specified B group vitamins: Secondary | ICD-10-CM

## 2022-12-18 DIAGNOSIS — R5383 Other fatigue: Secondary | ICD-10-CM | POA: Diagnosis not present

## 2022-12-18 DIAGNOSIS — R634 Abnormal weight loss: Secondary | ICD-10-CM

## 2022-12-18 DIAGNOSIS — E559 Vitamin D deficiency, unspecified: Secondary | ICD-10-CM | POA: Insufficient documentation

## 2022-12-18 DIAGNOSIS — R935 Abnormal findings on diagnostic imaging of other abdominal regions, including retroperitoneum: Secondary | ICD-10-CM | POA: Insufficient documentation

## 2022-12-18 DIAGNOSIS — E782 Mixed hyperlipidemia: Secondary | ICD-10-CM | POA: Insufficient documentation

## 2022-12-18 LAB — FERRITIN: Ferritin: 33 ng/mL (ref 11–307)

## 2022-12-18 LAB — CBC WITH DIFFERENTIAL/PLATELET
Abs Immature Granulocytes: 0.02 10*3/uL (ref 0.00–0.07)
Basophils Absolute: 0.1 10*3/uL (ref 0.0–0.1)
Basophils Relative: 1 %
Eosinophils Absolute: 0.5 10*3/uL (ref 0.0–0.5)
Eosinophils Relative: 6 %
HCT: 40.9 % (ref 36.0–46.0)
Hemoglobin: 13.6 g/dL (ref 12.0–15.0)
Immature Granulocytes: 0 %
Lymphocytes Relative: 37 %
Lymphs Abs: 3.4 10*3/uL (ref 0.7–4.0)
MCH: 32.4 pg (ref 26.0–34.0)
MCHC: 33.3 g/dL (ref 30.0–36.0)
MCV: 97.4 fL (ref 80.0–100.0)
Monocytes Absolute: 0.8 10*3/uL (ref 0.1–1.0)
Monocytes Relative: 9 %
Neutro Abs: 4.4 10*3/uL (ref 1.7–7.7)
Neutrophils Relative %: 47 %
Platelets: 354 10*3/uL (ref 150–400)
RBC: 4.2 MIL/uL (ref 3.87–5.11)
RDW: 12.7 % (ref 11.5–15.5)
WBC: 9.2 10*3/uL (ref 4.0–10.5)
nRBC: 0 % (ref 0.0–0.2)

## 2022-12-18 LAB — COMPREHENSIVE METABOLIC PANEL
ALT: 9 U/L (ref 0–44)
AST: 18 U/L (ref 15–41)
Albumin: 3.9 g/dL (ref 3.5–5.0)
Alkaline Phosphatase: 73 U/L (ref 38–126)
Anion gap: 7 (ref 5–15)
BUN: 14 mg/dL (ref 6–20)
CO2: 21 mmol/L — ABNORMAL LOW (ref 22–32)
Calcium: 9.4 mg/dL (ref 8.9–10.3)
Chloride: 110 mmol/L (ref 98–111)
Creatinine, Ser: 0.83 mg/dL (ref 0.44–1.00)
GFR, Estimated: >60 mL/min (ref >60)
Glucose, Bld: 82 mg/dL (ref 70–99)
Potassium: 5.6 mmol/L — ABNORMAL HIGH (ref 3.5–5.1)
Sodium: 138 mmol/L (ref 135–145)
Total Bilirubin: 1.2 mg/dL (ref 0.3–1.2)
Total Protein: 7 g/dL (ref 6.5–8.1)

## 2022-12-18 LAB — IRON AND TIBC
Iron: 75 ug/dL (ref 28–170)
Saturation Ratios: 20 % (ref 10.4–31.8)
TIBC: 384 ug/dL (ref 250–450)
UIBC: 309 ug/dL

## 2022-12-18 LAB — T4, FREE: Free T4: 0.76 ng/dL (ref 0.61–1.12)

## 2022-12-18 LAB — LIPID PANEL
Cholesterol: 220 mg/dL — ABNORMAL HIGH (ref 0–200)
HDL: 49 mg/dL (ref >40)
LDL Cholesterol: 136 mg/dL — ABNORMAL HIGH (ref 0–99)
Total CHOL/HDL Ratio: 4.5 RATIO
Triglycerides: 173 mg/dL — ABNORMAL HIGH (ref <150)
VLDL: 35 mg/dL (ref 0–40)

## 2022-12-18 LAB — VITAMIN D 25 HYDROXY (VIT D DEFICIENCY, FRACTURES): Vit D, 25-Hydroxy: 31.33 ng/mL (ref 30–100)

## 2022-12-18 LAB — TSH: TSH: 1.195 u[IU]/mL (ref 0.350–4.500)

## 2022-12-18 LAB — VITAMIN B12: Vitamin B-12: >7500 pg/mL — ABNORMAL HIGH (ref 180–914)

## 2022-12-18 MED ORDER — BUPROPION HCL ER (XL) 150 MG PO TB24
150.0000 mg | ORAL_TABLET | Freq: Every day | ORAL | 1 refills | Status: DC
  Filled 2022-12-18: qty 30, 30d supply, fill #0
  Filled 2023-01-17: qty 30, 30d supply, fill #1

## 2022-12-18 MED ORDER — CYANOCOBALAMIN 1000 MCG/ML IJ SOLN
1000.00 ug | Freq: Once | INTRAMUSCULAR | Status: AC
Start: 2022-12-18 — End: 2022-12-18
  Administered 2022-12-18: 1000 ug via INTRAMUSCULAR

## 2022-12-18 MED ORDER — ENSURE PO LIQD: 237.0000 mL | Freq: Two times a day (BID) | ORAL | 12 refills | Status: DC

## 2022-12-18 NOTE — Assessment & Plan Note (Signed)
CT Scan of abdomen/pelvis ordered today.  Will start getting pt set up with GI for Colonoscopy.   Will recheck at follow up.

## 2022-12-18 NOTE — Assessment & Plan Note (Signed)
Checking labs today.  Will continue supplements as needed.  

## 2022-12-18 NOTE — Telephone Encounter (Signed)
Discussed at follow up

## 2022-12-18 NOTE — Assessment & Plan Note (Signed)
A1C Continues to be in prediabetic ranges.  Will reassess at follow up after next lab check.  Patient counseled on dietary choices and verbalized understanding.  Patient educated on foods that contain carbohydrates and the need to decrease intake.  We discussed prediabetes, and what it means and the need for strict dietary control to prevent progression to type 2 diabetes.  Advised to decrease intake of sugary drinks, including sodas, sweet tea, and some juices, and of starch and sugar heavy foods (ie., potatoes, rice, bread, pasta, desserts). She verbalizes understanding and agreement with the changes discussed today.  
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
## 2022-12-18 NOTE — Assessment & Plan Note (Signed)
Checking labs today.  Continue current therapy for lipid control. Will modify as needed based on labwork results.  

## 2022-12-19 LAB — T3, FREE: T3, Free: 2.5 pg/mL (ref 2.0–4.4)

## 2022-12-19 LAB — HEMOGLOBIN A1C
Hgb A1c MFr Bld: 5.3 % (ref 4.8–5.6)
Mean Plasma Glucose: 105 mg/dL

## 2022-12-20 ENCOUNTER — Other Ambulatory Visit: Payer: Self-pay

## 2022-12-22 ENCOUNTER — Telehealth: Payer: Self-pay | Admitting: Family

## 2022-12-22 NOTE — Telephone Encounter (Signed)
Patient called in wanting to know how many Ensure per day she is supposed to have for her Rx? According to Rx on file it is 2 a day. Patient notified.

## 2022-12-26 ENCOUNTER — Ambulatory Visit (INDEPENDENT_AMBULATORY_CARE_PROVIDER_SITE_OTHER): Payer: Medicaid Other

## 2022-12-26 DIAGNOSIS — R634 Abnormal weight loss: Secondary | ICD-10-CM

## 2022-12-26 DIAGNOSIS — R935 Abnormal findings on diagnostic imaging of other abdominal regions, including retroperitoneum: Secondary | ICD-10-CM

## 2022-12-29 ENCOUNTER — Other Ambulatory Visit: Payer: Self-pay | Admitting: Family

## 2023-01-01 ENCOUNTER — Ambulatory Visit: Payer: Medicaid Other | Admitting: Family

## 2023-01-04 ENCOUNTER — Telehealth: Payer: Self-pay

## 2023-01-09 NOTE — Telephone Encounter (Signed)
Pt spoken to on 7/29.

## 2023-01-11 ENCOUNTER — Other Ambulatory Visit: Payer: Self-pay | Admitting: Family

## 2023-01-11 ENCOUNTER — Other Ambulatory Visit: Payer: Self-pay

## 2023-01-11 ENCOUNTER — Other Ambulatory Visit: Payer: Self-pay | Admitting: Gerontology

## 2023-01-11 DIAGNOSIS — R251 Tremor, unspecified: Secondary | ICD-10-CM

## 2023-01-12 ENCOUNTER — Other Ambulatory Visit: Payer: Self-pay

## 2023-01-12 MED FILL — Topiramate Tab 50 MG: ORAL | 30 days supply | Qty: 60 | Fill #0 | Status: AC

## 2023-01-12 MED FILL — Gabapentin Tab 600 MG: ORAL | 30 days supply | Qty: 90 | Fill #0 | Status: AC

## 2023-01-17 ENCOUNTER — Other Ambulatory Visit: Payer: Self-pay | Admitting: Family

## 2023-01-17 ENCOUNTER — Other Ambulatory Visit: Payer: Self-pay

## 2023-01-18 ENCOUNTER — Other Ambulatory Visit: Payer: Self-pay

## 2023-01-18 ENCOUNTER — Ambulatory Visit: Payer: Medicaid Other | Admitting: Family

## 2023-01-18 ENCOUNTER — Other Ambulatory Visit: Payer: Self-pay | Admitting: Family

## 2023-01-19 ENCOUNTER — Other Ambulatory Visit: Payer: Self-pay

## 2023-01-19 MED FILL — Albuterol Sulfate Inhal Aero 108 MCG/ACT (90MCG Base Equiv): RESPIRATORY_TRACT | 16 days supply | Qty: 18 | Fill #0 | Status: AC

## 2023-01-21 ENCOUNTER — Other Ambulatory Visit: Payer: Self-pay

## 2023-01-25 ENCOUNTER — Other Ambulatory Visit: Payer: Self-pay

## 2023-02-06 NOTE — Telephone Encounter (Signed)
done

## 2023-02-08 ENCOUNTER — Other Ambulatory Visit: Payer: Self-pay | Admitting: Family

## 2023-02-08 DIAGNOSIS — R634 Abnormal weight loss: Secondary | ICD-10-CM

## 2023-02-09 ENCOUNTER — Other Ambulatory Visit (HOSPITAL_BASED_OUTPATIENT_CLINIC_OR_DEPARTMENT_OTHER): Payer: Self-pay

## 2023-02-09 ENCOUNTER — Other Ambulatory Visit: Payer: Self-pay

## 2023-02-09 ENCOUNTER — Ambulatory Visit (INDEPENDENT_AMBULATORY_CARE_PROVIDER_SITE_OTHER): Payer: Medicaid Other | Admitting: Family

## 2023-02-09 VITALS — BP 120/75 | HR 67 | Ht 63.0 in | Wt 113.0 lb

## 2023-02-09 DIAGNOSIS — R251 Tremor, unspecified: Secondary | ICD-10-CM | POA: Diagnosis not present

## 2023-02-09 DIAGNOSIS — R634 Abnormal weight loss: Secondary | ICD-10-CM

## 2023-02-09 DIAGNOSIS — I83813 Varicose veins of bilateral lower extremities with pain: Secondary | ICD-10-CM

## 2023-02-09 MED ORDER — CEFDINIR 300 MG PO CAPS
300.0000 mg | ORAL_CAPSULE | Freq: Two times a day (BID) | ORAL | 0 refills | Status: DC
  Filled 2023-02-09: qty 14, 7d supply, fill #0

## 2023-02-09 MED ORDER — TRAZODONE HCL 50 MG PO TABS
50.0000 mg | ORAL_TABLET | Freq: Every evening | ORAL | 1 refills | Status: DC | PRN
  Filled 2023-02-09 (×2): qty 30, 30d supply, fill #0
  Filled 2023-04-13: qty 30, 30d supply, fill #1

## 2023-02-09 MED ORDER — PROPRANOLOL HCL 10 MG PO TABS
10.0000 mg | ORAL_TABLET | Freq: Two times a day (BID) | ORAL | 11 refills | Status: DC
Start: 2023-02-09 — End: 2023-10-29
  Filled 2023-02-09 (×2): qty 60, 30d supply, fill #0
  Filled 2023-03-08: qty 60, 30d supply, fill #1
  Filled 2023-04-13: qty 60, 30d supply, fill #2
  Filled 2023-05-17: qty 60, 30d supply, fill #3
  Filled 2023-06-27: qty 60, 30d supply, fill #4
  Filled 2023-07-23: qty 60, 30d supply, fill #5
  Filled 2023-08-23 (×2): qty 60, 30d supply, fill #6
  Filled 2023-09-13 – 2023-09-17 (×2): qty 60, 30d supply, fill #7
  Filled 2023-10-16: qty 60, 30d supply, fill #8

## 2023-02-16 ENCOUNTER — Other Ambulatory Visit: Payer: Self-pay | Admitting: Family

## 2023-02-16 ENCOUNTER — Other Ambulatory Visit: Payer: Self-pay

## 2023-02-16 MED FILL — Gabapentin Tab 600 MG: ORAL | 30 days supply | Qty: 90 | Fill #1 | Status: CN

## 2023-02-16 MED FILL — Topiramate Tab 50 MG: ORAL | 30 days supply | Qty: 60 | Fill #1 | Status: AC

## 2023-02-16 MED FILL — Albuterol Sulfate Soln Nebu 1.25 MG/3ML (Base Equiv): RESPIRATORY_TRACT | 25 days supply | Qty: 300 | Fill #0 | Status: CN

## 2023-02-16 MED FILL — Topiramate Tab 50 MG: ORAL | 30 days supply | Qty: 60 | Fill #1 | Status: CN

## 2023-02-16 MED FILL — Gabapentin Tab 600 MG: ORAL | 30 days supply | Qty: 90 | Fill #1 | Status: AC

## 2023-02-16 MED FILL — Bupropion HCl Tab ER 24HR 150 MG: ORAL | 30 days supply | Qty: 30 | Fill #0 | Status: AC

## 2023-02-18 ENCOUNTER — Other Ambulatory Visit: Payer: Self-pay

## 2023-02-19 ENCOUNTER — Other Ambulatory Visit: Payer: Self-pay

## 2023-02-21 ENCOUNTER — Telehealth: Payer: Self-pay

## 2023-02-22 LAB — OVA AND PARASITE EXAMINATION

## 2023-02-22 LAB — PANCREATIC ELASTASE, FECAL: Pancreatic Elastase, Fecal: 97 ug Elast./g — ABNORMAL LOW (ref >200)

## 2023-02-23 ENCOUNTER — Encounter: Payer: Self-pay | Admitting: Family

## 2023-02-23 ENCOUNTER — Ambulatory Visit (INDEPENDENT_AMBULATORY_CARE_PROVIDER_SITE_OTHER): Payer: Medicaid Other | Admitting: Family

## 2023-02-23 VITALS — BP 112/62 | HR 55 | Ht 63.0 in | Wt 111.8 lb

## 2023-02-23 DIAGNOSIS — K8681 Exocrine pancreatic insufficiency: Secondary | ICD-10-CM | POA: Diagnosis not present

## 2023-02-23 LAB — STOOL CULTURE: E coli, Shiga toxin Assay: NEGATIVE
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
## 2023-02-25 ENCOUNTER — Encounter: Payer: Self-pay | Admitting: Family

## 2023-02-25 DIAGNOSIS — I83813 Varicose veins of bilateral lower extremities with pain: Secondary | ICD-10-CM | POA: Insufficient documentation

## 2023-02-25 NOTE — Assessment & Plan Note (Signed)
Sending propranolol for patient to try for her tremors.  Will recheck at her follow up.

## 2023-02-25 NOTE — Assessment & Plan Note (Signed)
Awaiting stool tests for patient.  We will discuss at follow up.

## 2023-02-25 NOTE — Assessment & Plan Note (Signed)
Will send referral for Vascular.   Recheck at follow up.

## 2023-02-28 ENCOUNTER — Telehealth: Payer: Self-pay | Admitting: Family

## 2023-02-28 NOTE — Telephone Encounter (Signed)
Patient called in stating that the new medicine Marchelle Folks put her on for pancreatitis is causing her stomach pain. She is wanting to know if there is something else we can send in. She also states that she likes chocolate milk and when she drinks it she gets diarrhea. Please advise.   [facility]

## 2023-03-01 ENCOUNTER — Other Ambulatory Visit: Payer: Self-pay

## 2023-03-01 ENCOUNTER — Other Ambulatory Visit: Payer: Self-pay | Admitting: Family

## 2023-03-01 MED ORDER — ZENPEP 10000-32000 UNITS PO CPEP
ORAL_CAPSULE | ORAL | 0 refills | Status: DC
  Filled 2023-03-01: qty 270, 30d supply, fill #0

## 2023-03-01 NOTE — Telephone Encounter (Signed)
Patient was notified and confirmed trying a lower dose on medication.

## 2023-03-02 ENCOUNTER — Other Ambulatory Visit: Payer: Self-pay

## 2023-03-02 MED FILL — Albuterol Sulfate Soln Nebu 1.25 MG/3ML (Base Equiv): RESPIRATORY_TRACT | 25 days supply | Qty: 300 | Fill #0 | Status: AC

## 2023-03-05 ENCOUNTER — Other Ambulatory Visit: Payer: Self-pay | Admitting: Family

## 2023-03-08 ENCOUNTER — Other Ambulatory Visit: Payer: Self-pay

## 2023-03-13 ENCOUNTER — Ambulatory Visit: Payer: Medicaid Other | Admitting: Nurse Practitioner

## 2023-03-14 ENCOUNTER — Telehealth: Payer: Self-pay | Admitting: Family

## 2023-03-14 NOTE — Telephone Encounter (Signed)
Patient assistance left VM that the patient requires a PA - if PA is denied then we have to do an appeal. The patient assistance program required any approval or denial letters from the insurance company.   Callback # for patient assistance is [phone]

## 2023-03-15 ENCOUNTER — Telehealth: Payer: Self-pay | Admitting: Family

## 2023-03-15 ENCOUNTER — Ambulatory Visit: Payer: Medicaid Other | Admitting: Family

## 2023-03-15 NOTE — Telephone Encounter (Signed)
Patient assistance called to let us know that a PA is required for her Nurtec. If denied must do an appeal.

## 2023-03-16 NOTE — Telephone Encounter (Signed)
Awaiting for PA approval or denial

## 2023-03-21 ENCOUNTER — Telehealth: Payer: Self-pay | Admitting: Family

## 2023-03-21 ENCOUNTER — Other Ambulatory Visit: Payer: Self-pay | Admitting: Family

## 2023-03-21 ENCOUNTER — Other Ambulatory Visit: Payer: Self-pay

## 2023-03-21 DIAGNOSIS — R251 Tremor, unspecified: Secondary | ICD-10-CM

## 2023-03-21 MED FILL — Bupropion HCl Tab ER 24HR 150 MG: ORAL | 30 days supply | Qty: 30 | Fill #1 | Status: AC

## 2023-03-21 NOTE — Telephone Encounter (Signed)
[employer] Patient Assistance left VM needing a PA to be completed for the patient's Nurtec and needs the correspondence faxed to them at [phone].  Callback # [phone]  Have you done this PA?

## 2023-03-22 ENCOUNTER — Other Ambulatory Visit: Payer: Self-pay

## 2023-03-22 MED ORDER — TOPIRAMATE 50 MG PO TABS
50.0000 mg | ORAL_TABLET | Freq: Two times a day (BID) | ORAL | 1 refills | Status: DC
Start: 2023-03-22 — End: 2023-05-17
  Filled 2023-03-22: qty 60, 30d supply, fill #0
  Filled 2023-04-23: qty 60, 30d supply, fill #1

## 2023-03-22 MED ORDER — GABAPENTIN 600 MG PO TABS
600.0000 mg | ORAL_TABLET | Freq: Three times a day (TID) | ORAL | 1 refills | Status: DC
  Filled 2023-03-22: qty 90, 30d supply, fill #0
  Filled 2023-04-23: qty 90, 30d supply, fill #1

## 2023-03-25 ENCOUNTER — Encounter: Payer: Self-pay | Admitting: Family

## 2023-03-25 DIAGNOSIS — K8681 Exocrine pancreatic insufficiency: Secondary | ICD-10-CM | POA: Insufficient documentation

## 2023-03-25 NOTE — Assessment & Plan Note (Signed)
Starting patient on Zenpep today.  Samples given in office.   Suggested that patient try these and let me know how it goes.  Will follow up in 1 month.
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
## 2023-04-05 ENCOUNTER — Other Ambulatory Visit (INDEPENDENT_AMBULATORY_CARE_PROVIDER_SITE_OTHER): Payer: Self-pay | Admitting: Nurse Practitioner

## 2023-04-05 DIAGNOSIS — I83813 Varicose veins of bilateral lower extremities with pain: Secondary | ICD-10-CM

## 2023-04-11 ENCOUNTER — Encounter (INDEPENDENT_AMBULATORY_CARE_PROVIDER_SITE_OTHER): Payer: Self-pay | Admitting: Nurse Practitioner

## 2023-04-11 ENCOUNTER — Encounter (INDEPENDENT_AMBULATORY_CARE_PROVIDER_SITE_OTHER): Payer: Medicaid Other

## 2023-04-13 ENCOUNTER — Other Ambulatory Visit: Payer: Self-pay

## 2023-04-23 ENCOUNTER — Other Ambulatory Visit: Payer: Self-pay

## 2023-04-23 ENCOUNTER — Other Ambulatory Visit: Payer: Self-pay | Admitting: Family

## 2023-04-23 MED FILL — Bupropion HCl Tab ER 24HR 150 MG: ORAL | 30 days supply | Qty: 30 | Fill #0 | Status: AC

## 2023-04-23 MED FILL — Albuterol Sulfate Inhal Aero 108 MCG/ACT (90MCG Base Equiv): RESPIRATORY_TRACT | 30 days supply | Qty: 18 | Fill #0 | Status: AC

## 2023-04-24 ENCOUNTER — Other Ambulatory Visit: Payer: Self-pay | Admitting: Family

## 2023-04-24 ENCOUNTER — Other Ambulatory Visit: Payer: Self-pay

## 2023-04-24 MED ORDER — FLUTICASONE FUROATE-VILANTEROL 100-25 MCG/ACT IN AEPB
1.0000 | INHALATION_SPRAY | Freq: Every day | RESPIRATORY_TRACT | 3 refills | Status: DC
  Filled 2023-04-24: qty 180, 90d supply, fill #0

## 2023-04-26 ENCOUNTER — Other Ambulatory Visit: Payer: Self-pay

## 2023-04-30 ENCOUNTER — Ambulatory Visit: Payer: Medicaid Other | Admitting: Family

## 2023-04-30 ENCOUNTER — Telehealth: Payer: Self-pay | Admitting: Family

## 2023-04-30 ENCOUNTER — Other Ambulatory Visit: Payer: Self-pay

## 2023-04-30 ENCOUNTER — Encounter: Payer: Self-pay | Admitting: Family

## 2023-04-30 VITALS — BP 110/78 | HR 60 | Ht 63.0 in | Wt 114.0 lb

## 2023-04-30 DIAGNOSIS — H6506 Acute serous otitis media, recurrent, bilateral: Secondary | ICD-10-CM | POA: Diagnosis not present

## 2023-04-30 DIAGNOSIS — Z0130 Encounter for examination of blood pressure without abnormal findings: Secondary | ICD-10-CM

## 2023-04-30 DIAGNOSIS — J324 Chronic pansinusitis: Secondary | ICD-10-CM

## 2023-04-30 MED ORDER — AMOXICILLIN-POT CLAVULANATE 875-125 MG PO TABS
1.0000 | ORAL_TABLET | Freq: Two times a day (BID) | ORAL | 0 refills | Status: DC
  Filled 2023-04-30: qty 20, 10d supply, fill #0

## 2023-04-30 NOTE — Telephone Encounter (Signed)
Patient left VM stating that Marchelle Folks wants her taking her higher dose of medication with meals and the lower dose with snacks. She wants to know which dose she should take with her protein shakes?   Spoke with Marchelle Folks and she said lets start with the lower dose and see how she does. Patient notified via VM.

## 2023-05-09 ENCOUNTER — Other Ambulatory Visit: Payer: Self-pay

## 2023-05-09 ENCOUNTER — Other Ambulatory Visit: Payer: Self-pay | Admitting: Family

## 2023-05-09 MED ORDER — BUDESONIDE-FORMOTEROL FUMARATE 160-4.5 MCG/ACT IN AERO
2.0000 | INHALATION_SPRAY | Freq: Two times a day (BID) | RESPIRATORY_TRACT | 12 refills | Status: DC
  Filled 2023-05-09: qty 10.2, 30d supply, fill #0

## 2023-05-17 ENCOUNTER — Other Ambulatory Visit
Admission: RE | Admit: 2023-05-17 | Discharge: 2023-05-17 | Disposition: A | Discharge status: Home / Self Care | Payer: Medicaid Other | Source: Ambulatory Visit | Attending: Family | Admitting: Family

## 2023-05-17 ENCOUNTER — Encounter: Payer: Self-pay | Admitting: Family

## 2023-05-17 ENCOUNTER — Ambulatory Visit: Payer: Medicaid Other | Admitting: Family

## 2023-05-17 ENCOUNTER — Other Ambulatory Visit: Payer: Self-pay

## 2023-05-17 ENCOUNTER — Other Ambulatory Visit: Payer: Self-pay | Admitting: Family

## 2023-05-17 VITALS — BP 122/80 | HR 40 | Ht 63.0 in | Wt 116.8 lb

## 2023-05-17 DIAGNOSIS — Z0130 Encounter for examination of blood pressure without abnormal findings: Secondary | ICD-10-CM

## 2023-05-17 DIAGNOSIS — E559 Vitamin D deficiency, unspecified: Secondary | ICD-10-CM

## 2023-05-17 DIAGNOSIS — R251 Tremor, unspecified: Secondary | ICD-10-CM

## 2023-05-17 DIAGNOSIS — R7303 Prediabetes: Secondary | ICD-10-CM | POA: Diagnosis not present

## 2023-05-17 DIAGNOSIS — G894 Chronic pain syndrome: Secondary | ICD-10-CM | POA: Diagnosis not present

## 2023-05-17 DIAGNOSIS — H9313 Tinnitus, bilateral: Secondary | ICD-10-CM

## 2023-05-17 DIAGNOSIS — E782 Mixed hyperlipidemia: Secondary | ICD-10-CM

## 2023-05-17 DIAGNOSIS — E538 Deficiency of other specified B group vitamins: Secondary | ICD-10-CM | POA: Diagnosis not present

## 2023-05-17 DIAGNOSIS — M899 Disorder of bone, unspecified: Secondary | ICD-10-CM

## 2023-05-17 DIAGNOSIS — R5383 Other fatigue: Secondary | ICD-10-CM

## 2023-05-17 DIAGNOSIS — R001 Bradycardia, unspecified: Secondary | ICD-10-CM

## 2023-05-17 DIAGNOSIS — K8681 Exocrine pancreatic insufficiency: Secondary | ICD-10-CM

## 2023-05-17 LAB — COMPREHENSIVE METABOLIC PANEL
ALT: 12 U/L (ref 0–44)
AST: 21 U/L (ref 15–41)
Albumin: 4.1 g/dL (ref 3.5–5.0)
Alkaline Phosphatase: 77 U/L (ref 38–126)
Anion gap: 11 (ref 5–15)
BUN: 14 mg/dL (ref 6–20)
CO2: 19 mmol/L — ABNORMAL LOW (ref 22–32)
Calcium: 9 mg/dL (ref 8.9–10.3)
Chloride: 106 mmol/L (ref 98–111)
Creatinine, Ser: 0.67 mg/dL (ref 0.44–1.00)
GFR, Estimated: >60 mL/min (ref >60)
Glucose, Bld: 84 mg/dL (ref 70–99)
Potassium: 3.9 mmol/L (ref 3.5–5.1)
Sodium: 136 mmol/L (ref 135–145)
Total Bilirubin: 0.6 mg/dL (ref <1.2)
Total Protein: 7.2 g/dL (ref 6.5–8.1)

## 2023-05-17 LAB — CBC WITH DIFFERENTIAL/PLATELET
Abs Immature Granulocytes: 0.03 10*3/uL (ref 0.00–0.07)
Basophils Absolute: 0 10*3/uL (ref 0.0–0.1)
Basophils Relative: 0 %
Eosinophils Absolute: 0.2 10*3/uL (ref 0.0–0.5)
Eosinophils Relative: 2 %
HCT: 39.3 % (ref 36.0–46.0)
Hemoglobin: 13.8 g/dL (ref 12.0–15.0)
Immature Granulocytes: 0 %
Lymphocytes Relative: 43 %
Lymphs Abs: 4.3 10*3/uL — ABNORMAL HIGH (ref 0.7–4.0)
MCH: 33.6 pg (ref 26.0–34.0)
MCHC: 35.1 g/dL (ref 30.0–36.0)
MCV: 95.6 fL (ref 80.0–100.0)
Monocytes Absolute: 1 10*3/uL (ref 0.1–1.0)
Monocytes Relative: 10 %
Neutro Abs: 4.4 10*3/uL (ref 1.7–7.7)
Neutrophils Relative %: 45 %
Platelets: 341 10*3/uL (ref 150–400)
RBC: 4.11 MIL/uL (ref 3.87–5.11)
RDW: 13.4 % (ref 11.5–15.5)
WBC: 10 10*3/uL (ref 4.0–10.5)
nRBC: 0 % (ref 0.0–0.2)

## 2023-05-17 LAB — LIPID PANEL
Cholesterol: 191 mg/dL (ref 0–200)
HDL: 52 mg/dL (ref >40)
LDL Cholesterol: 116 mg/dL — ABNORMAL HIGH (ref 0–99)
Total CHOL/HDL Ratio: 3.7 {ratio}
Triglycerides: 115 mg/dL (ref <150)
VLDL: 23 mg/dL (ref 0–40)

## 2023-05-17 LAB — FERRITIN: Ferritin: 14 ng/mL (ref 11–307)

## 2023-05-17 LAB — IRON AND TIBC
Iron: 125 ug/dL (ref 28–170)
Saturation Ratios: 28 % (ref 10.4–31.8)
TIBC: 444 ug/dL (ref 250–450)
UIBC: 319 ug/dL

## 2023-05-17 LAB — VITAMIN D 25 HYDROXY (VIT D DEFICIENCY, FRACTURES): Vit D, 25-Hydroxy: 33.01 ng/mL (ref 30–100)

## 2023-05-17 LAB — HEMOGLOBIN A1C
Hgb A1c MFr Bld: 5.3 % (ref 4.8–5.6)
Mean Plasma Glucose: 105.41 mg/dL

## 2023-05-17 LAB — T4, FREE: Free T4: 1.12 ng/dL (ref 0.61–1.12)

## 2023-05-17 LAB — VITAMIN B12: Vitamin B-12: 264 pg/mL (ref 180–914)

## 2023-05-17 LAB — TSH: TSH: 1.337 u[IU]/mL (ref 0.350–4.500)

## 2023-05-17 MED ORDER — TIZANIDINE HCL 4 MG PO TABS
4.0000 mg | ORAL_TABLET | Freq: Four times a day (QID) | ORAL | 3 refills | Status: DC | PRN
  Filled 2023-05-17: qty 120, 30d supply, fill #0
  Filled 2023-07-23: qty 120, 30d supply, fill #1
  Filled 2023-09-13: qty 120, 30d supply, fill #2
  Filled 2023-10-15: qty 120, 30d supply, fill #3

## 2023-05-17 MED ORDER — ALBUTEROL SULFATE HFA 108 (90 BASE) MCG/ACT IN AERS
2.0000 | INHALATION_SPRAY | RESPIRATORY_TRACT | 0 refills | Status: DC | PRN
  Filled 2023-05-17: qty 18, 30d supply, fill #0

## 2023-05-17 MED ORDER — ZENPEP 10000-32000 UNITS PO CPEP
ORAL_CAPSULE | ORAL | 0 refills | Status: DC
  Filled 2023-05-17: qty 270, 30d supply, fill #0

## 2023-05-17 MED ORDER — TRAZODONE HCL 50 MG PO TABS
50.0000 mg | ORAL_TABLET | Freq: Every evening | ORAL | 1 refills | Status: DC | PRN
  Filled 2023-05-17: qty 30, 30d supply, fill #0

## 2023-05-17 MED ORDER — FAMOTIDINE 20 MG PO TABS
20.0000 mg | ORAL_TABLET | Freq: Every day | ORAL | 1 refills | Status: AC
  Filled 2023-05-17: qty 90, 90d supply, fill #0
  Filled 2023-09-21: qty 90, 90d supply, fill #1

## 2023-05-17 MED ORDER — TERBINAFINE HCL 250 MG PO TABS
250.0000 mg | ORAL_TABLET | Freq: Every day | ORAL | 0 refills | Status: DC
  Filled 2023-05-17: qty 30, 30d supply, fill #0

## 2023-05-17 MED ORDER — TOPIRAMATE 50 MG PO TABS
50.0000 mg | ORAL_TABLET | Freq: Two times a day (BID) | ORAL | 1 refills | Status: DC
  Filled 2023-05-17: qty 60, 30d supply, fill #0
  Filled 2023-06-27: qty 60, 30d supply, fill #1

## 2023-05-17 MED ORDER — BUDESONIDE-FORMOTEROL FUMARATE 160-4.5 MCG/ACT IN AERO
2.0000 | INHALATION_SPRAY | Freq: Two times a day (BID) | RESPIRATORY_TRACT | 12 refills | Status: DC
  Filled 2023-06-27: qty 10.2, 30d supply, fill #0

## 2023-05-17 MED FILL — Bupropion HCl Tab ER 24HR 150 MG: ORAL | 30 days supply | Qty: 30 | Fill #1 | Status: AC
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
## 2023-05-18 ENCOUNTER — Other Ambulatory Visit: Payer: Self-pay

## 2023-05-18 LAB — T3, FREE: T3, Free: 2.6 pg/mL (ref 2.0–4.4)

## 2023-05-22 ENCOUNTER — Other Ambulatory Visit: Payer: Self-pay | Admitting: Family

## 2023-05-22 ENCOUNTER — Other Ambulatory Visit: Payer: Self-pay

## 2023-05-22 ENCOUNTER — Ambulatory Visit (INDEPENDENT_AMBULATORY_CARE_PROVIDER_SITE_OTHER): Payer: Medicaid Other | Admitting: Cardiovascular Disease

## 2023-05-22 ENCOUNTER — Encounter: Payer: Self-pay | Admitting: Cardiovascular Disease

## 2023-05-22 VITALS — BP 120/66 | HR 64 | Ht 63.0 in | Wt 117.4 lb

## 2023-05-22 DIAGNOSIS — R001 Bradycardia, unspecified: Secondary | ICD-10-CM

## 2023-05-22 DIAGNOSIS — R9431 Abnormal electrocardiogram [ECG] [EKG]: Secondary | ICD-10-CM | POA: Diagnosis not present

## 2023-05-22 DIAGNOSIS — R0789 Other chest pain: Secondary | ICD-10-CM | POA: Diagnosis not present

## 2023-05-22 DIAGNOSIS — F17210 Nicotine dependence, cigarettes, uncomplicated: Secondary | ICD-10-CM | POA: Diagnosis not present

## 2023-05-22 DIAGNOSIS — R0602 Shortness of breath: Secondary | ICD-10-CM

## 2023-05-22 MED FILL — Gabapentin Tab 600 MG: ORAL | 30 days supply | Qty: 90 | Fill #0 | Status: AC
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
## 2023-05-23 ENCOUNTER — Other Ambulatory Visit: Payer: Self-pay

## 2023-05-23 ENCOUNTER — Telehealth: Payer: Self-pay | Admitting: Family

## 2023-05-23 NOTE — Telephone Encounter (Signed)
PT called stating she wants to know why she is having to do a stress test & echo  Please Advise

## 2023-05-30 ENCOUNTER — Other Ambulatory Visit: Payer: Self-pay

## 2023-06-01 ENCOUNTER — Telehealth: Payer: Self-pay

## 2023-06-01 ENCOUNTER — Other Ambulatory Visit (HOSPITAL_COMMUNITY): Payer: Self-pay

## 2023-06-01 ENCOUNTER — Other Ambulatory Visit: Payer: Self-pay

## 2023-06-01 ENCOUNTER — Encounter: Payer: Self-pay | Admitting: Family

## 2023-06-01 ENCOUNTER — Ambulatory Visit (INDEPENDENT_AMBULATORY_CARE_PROVIDER_SITE_OTHER): Payer: Medicaid Other | Admitting: Family

## 2023-06-01 DIAGNOSIS — E538 Deficiency of other specified B group vitamins: Secondary | ICD-10-CM

## 2023-06-01 MED ORDER — CYANOCOBALAMIN 1000 MCG/ML IJ SOLN
1000.0000 ug | Freq: Once | INTRAMUSCULAR | Status: AC
  Administered 2023-06-01: 1000 ug via INTRAMUSCULAR

## 2023-06-01 MED ORDER — AMOXICILLIN-POT CLAVULANATE 875-125 MG PO TABS
1.0000 | ORAL_TABLET | Freq: Two times a day (BID) | ORAL | 0 refills | Status: DC
  Filled 2023-06-01: qty 20, 10d supply, fill #0

## 2023-06-01 NOTE — Telephone Encounter (Signed)
Pt is having sinus issues and ear pain- please advise-HQ

## 2023-06-01 NOTE — Progress Notes (Signed)
   CHIEF COMPLAINT  B12 Shot     REASON FOR VISIT  B12 Injection     ASSESSMENT  B12 Deficiency, Unspecified     PLAN  Diagnoses and all orders for this visit:  B12 deficiency due to diet -     cyanocobalamin (VITAMIN B12) injection 1,000 mcg  Other orders -     amoxicillin-clavulanate (AUGMENTIN) 875-125 MG tablet; Take 1 tablet by mouth 2 (two) times daily for 10 days.     Pt. given B12 injection in clinic.  Return for next injection per provider instructions.   Total time spent: 5 minutes  Miki Kins, FNP  06/01/2023

## 2023-06-08 ENCOUNTER — Ambulatory Visit (INDEPENDENT_AMBULATORY_CARE_PROVIDER_SITE_OTHER): Payer: Medicaid Other

## 2023-06-08 DIAGNOSIS — R0602 Shortness of breath: Secondary | ICD-10-CM

## 2023-06-08 DIAGNOSIS — R0789 Other chest pain: Secondary | ICD-10-CM

## 2023-06-08 DIAGNOSIS — I351 Nonrheumatic aortic (valve) insufficiency: Secondary | ICD-10-CM

## 2023-06-08 DIAGNOSIS — I361 Nonrheumatic tricuspid (valve) insufficiency: Secondary | ICD-10-CM | POA: Diagnosis not present

## 2023-06-08 DIAGNOSIS — R001 Bradycardia, unspecified: Secondary | ICD-10-CM

## 2023-06-08 DIAGNOSIS — R9431 Abnormal electrocardiogram [ECG] [EKG]: Secondary | ICD-10-CM

## 2023-06-10 ENCOUNTER — Encounter: Payer: Self-pay | Admitting: Family

## 2023-06-10 NOTE — Assessment & Plan Note (Signed)
Checking labs today.  Continue current therapy for lipid control. Will modify as needed based on labwork results.  

## 2023-06-10 NOTE — Assessment & Plan Note (Signed)
Sending RX for the pancreatic enzymes.  Starting at a low dose.   Will check for response at follow up.

## 2023-06-10 NOTE — Assessment & Plan Note (Signed)
Checking labs today.  Will continue supplements as needed.  

## 2023-06-10 NOTE — Assessment & Plan Note (Signed)

## 2023-06-10 NOTE — Assessment & Plan Note (Signed)
Patient stable.  Well controlled with current therapy.   Continue current meds.  

## 2023-06-15 ENCOUNTER — Other Ambulatory Visit: Payer: Self-pay

## 2023-06-15 ENCOUNTER — Telehealth: Payer: Self-pay

## 2023-06-15 NOTE — Telephone Encounter (Signed)
 Patient called stating she went to [facility] on Monday and  was dx with a URI, she was tested for COVID and the Flu and they were negative, they gave her prednisone  and  tessalon  perles and she states they are not working for her and when she coughs she is peeing a little on herself please advise of we can send in something else for the patient or what you like her to do

## 2023-06-18 ENCOUNTER — Ambulatory Visit: Payer: Medicaid Other | Admitting: Family

## 2023-06-18 ENCOUNTER — Other Ambulatory Visit: Payer: Self-pay

## 2023-06-18 ENCOUNTER — Ambulatory Visit
Admission: RE | Admit: 2023-06-18 | Discharge: 2023-06-18 | Disposition: A | Discharge status: Home / Self Care | Payer: Medicaid Other | Source: Ambulatory Visit | Attending: Family | Admitting: Family

## 2023-06-18 ENCOUNTER — Ambulatory Visit
Admission: RE | Admit: 2023-06-18 | Discharge: 2023-06-18 | Disposition: A | Discharge status: Home / Self Care | Payer: Medicaid Other | Attending: Family | Admitting: Family

## 2023-06-18 ENCOUNTER — Encounter: Payer: Self-pay | Admitting: Family

## 2023-06-18 VITALS — BP 124/84 | HR 64 | Ht 63.0 in | Wt 114.8 lb

## 2023-06-18 DIAGNOSIS — J069 Acute upper respiratory infection, unspecified: Secondary | ICD-10-CM | POA: Diagnosis not present

## 2023-06-18 DIAGNOSIS — R0602 Shortness of breath: Secondary | ICD-10-CM | POA: Insufficient documentation

## 2023-06-18 DIAGNOSIS — Z0130 Encounter for examination of blood pressure without abnormal findings: Secondary | ICD-10-CM

## 2023-06-18 LAB — POCT XPERT XPRESS SARS COVID-2/FLU/RSV
FLU A: NEGATIVE
FLU B: NEGATIVE
RSV RNA, PCR: NEGATIVE
SARS Coronavirus 2: NEGATIVE

## 2023-06-18 MED ORDER — HYDROCOD POLI-CHLORPHE POLI ER 10-8 MG/5ML PO SUER
5.0000 mL | Freq: Two times a day (BID) | ORAL | 0 refills | Status: DC | PRN
  Filled 2023-06-18: qty 70, 7d supply, fill #0

## 2023-06-18 MED ORDER — LEVOFLOXACIN 500 MG PO TABS
500.0000 mg | ORAL_TABLET | Freq: Every day | ORAL | 0 refills | Status: DC
  Filled 2023-06-18: qty 7, 7d supply, fill #0

## 2023-06-18 NOTE — Progress Notes (Signed)
 Patient notified
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
## 2023-06-18 NOTE — Telephone Encounter (Signed)
Discussed at follow up

## 2023-06-20 ENCOUNTER — Ambulatory Visit: Payer: Medicaid Other | Admitting: Cardiology

## 2023-06-20 ENCOUNTER — Telehealth: Payer: Self-pay | Admitting: Family

## 2023-06-20 NOTE — Telephone Encounter (Signed)
 Patient left VM asking if she can take Excedrin Migraine with the cough medicine you sent her in. Please advise.

## 2023-06-21 ENCOUNTER — Telehealth: Payer: Self-pay | Admitting: Family

## 2023-06-21 ENCOUNTER — Other Ambulatory Visit (HOSPITAL_COMMUNITY): Payer: Self-pay

## 2023-06-21 NOTE — Telephone Encounter (Signed)
 Pt needs to know when she is able to go back to work & would like her cough medicine called in before the weekend [facility] still good pharm

## 2023-06-22 ENCOUNTER — Other Ambulatory Visit: Payer: Self-pay | Admitting: Family

## 2023-06-22 ENCOUNTER — Other Ambulatory Visit: Payer: Self-pay

## 2023-06-22 DIAGNOSIS — J069 Acute upper respiratory infection, unspecified: Secondary | ICD-10-CM

## 2023-06-22 DIAGNOSIS — R0602 Shortness of breath: Secondary | ICD-10-CM

## 2023-06-22 MED ORDER — HYDROCOD POLI-CHLORPHE POLI ER 10-8 MG/5ML PO SUER
5.0000 mL | Freq: Two times a day (BID) | ORAL | 0 refills | Status: DC | PRN
  Filled 2023-06-22 – 2023-06-25 (×2): qty 70, 7d supply, fill #0

## 2023-06-22 MED ORDER — LEVOFLOXACIN 500 MG PO TABS
500.0000 mg | ORAL_TABLET | Freq: Every day | ORAL | 0 refills | Status: DC
  Filled 2023-06-22: qty 7, 7d supply, fill #0

## 2023-06-24 ENCOUNTER — Encounter: Payer: Self-pay | Admitting: Family

## 2023-06-25 ENCOUNTER — Other Ambulatory Visit: Payer: Self-pay

## 2023-06-25 ENCOUNTER — Telehealth: Payer: Self-pay | Admitting: Family

## 2023-06-25 NOTE — Telephone Encounter (Signed)
 Patient left VM stating that she is still coughing up phlegm and wants to know if she needs to stay out of work until Thursday when she comes to see Alan again. I spoke with Alan and she said it is up to the patient - if she feels okay to return to work then she can, if she feels she isn't ready to we can write a letter for her. Left patient VM with this information and to call back and let us  know her decision.

## 2023-06-25 NOTE — Telephone Encounter (Signed)
 Patient called back and left VM that she is going to stay out of work until Thursday when she sees Marchelle Folks due to the flu and so much sickness going around. She would like a note to keep her out until Thursday.

## 2023-06-26 NOTE — Telephone Encounter (Signed)
 Pt has 10 days follow up with Marchelle Folks in two days

## 2023-06-26 NOTE — Telephone Encounter (Signed)
 Printed to be signed by provider and patient can come pick up

## 2023-06-27 ENCOUNTER — Other Ambulatory Visit: Payer: Self-pay

## 2023-06-27 ENCOUNTER — Other Ambulatory Visit: Payer: Self-pay | Admitting: Family

## 2023-06-27 MED ORDER — TERBINAFINE HCL 250 MG PO TABS
250.0000 mg | ORAL_TABLET | Freq: Every day | ORAL | 0 refills | Status: AC
  Filled 2023-06-27: qty 30, 30d supply, fill #0

## 2023-06-27 MED ORDER — BUPROPION HCL ER (XL) 150 MG PO TB24
150.0000 mg | ORAL_TABLET | Freq: Every day | ORAL | 1 refills | Status: DC
  Filled 2023-06-27: qty 30, 30d supply, fill #0
  Filled 2023-07-23: qty 30, 30d supply, fill #1

## 2023-06-27 MED FILL — Gabapentin Tab 600 MG: ORAL | 30 days supply | Qty: 90 | Fill #1 | Status: AC

## 2023-06-28 ENCOUNTER — Other Ambulatory Visit: Payer: Self-pay

## 2023-06-28 ENCOUNTER — Ambulatory Visit: Payer: Medicaid Other | Admitting: Family

## 2023-06-28 ENCOUNTER — Encounter: Payer: Self-pay | Admitting: Family

## 2023-06-28 VITALS — BP 118/80 | HR 57 | Ht 63.0 in | Wt 113.0 lb

## 2023-06-28 DIAGNOSIS — E538 Deficiency of other specified B group vitamins: Secondary | ICD-10-CM

## 2023-06-28 DIAGNOSIS — J069 Acute upper respiratory infection, unspecified: Secondary | ICD-10-CM

## 2023-06-28 DIAGNOSIS — Z0130 Encounter for examination of blood pressure without abnormal findings: Secondary | ICD-10-CM

## 2023-06-28 MED ORDER — DOXYCYCLINE HYCLATE 100 MG PO CAPS
100.0000 mg | ORAL_CAPSULE | Freq: Two times a day (BID) | ORAL | 0 refills | Status: DC
  Filled 2023-06-28: qty 14, 7d supply, fill #0

## 2023-06-28 MED ORDER — CYANOCOBALAMIN 1000 MCG/ML IJ SOLN
1000.0000 ug | Freq: Once | INTRAMUSCULAR | Status: AC
  Administered 2023-06-28: 1000 ug via INTRAMUSCULAR

## 2023-06-28 NOTE — Assessment & Plan Note (Signed)
B12 injection given in office today.  Return for next injection per provider instructions.

## 2023-06-28 NOTE — Progress Notes (Signed)
Acute Office Visit  Subjective:     Patient ID: Natasha Hanson, female    DOB: [birth year], 58 y.o.   MRN: [id number]  Patient is in today for  Chief Complaint  Patient presents with   Follow-up    10 day follow up    URI  The current episode started 1 to 4 weeks ago. The problem has been unchanged. The maximum temperature recorded prior to her arrival was 100.4 - 100.9 F. Associated symptoms include congestion, coughing, ear pain, headaches, joint pain, a plugged ear sensation, rhinorrhea, sinus pain, a sore throat and wheezing. She has tried acetaminophen, antihistamine, decongestant, eating, increased fluids, NSAIDs, inhaler use, sleep and steam for the symptoms. The treatment provided no relief.     Review of Systems  Constitutional:  Positive for malaise/fatigue.  HENT:  Positive for congestion, ear pain, rhinorrhea, sinus pain and sore throat.   Respiratory:  Positive for cough and wheezing.   Musculoskeletal:  Positive for joint pain.  Neurological:  Positive for headaches.  All other systems reviewed and are negative.       Objective:    BP 118/80   Pulse (!) 57   Ht 5\' 3"  (1.6 m)   Wt 113 lb (51.3 kg)   SpO2 98%   BMI 20.02 kg/m   Physical Exam Constitutional:      Appearance: Normal appearance. She is normal weight. She is ill-appearing.  HENT:     Nose: Congestion and rhinorrhea present.  Eyes:     Extraocular Movements: Extraocular movements intact.     Pupils: Pupils are equal, round, and reactive to light.  Cardiovascular:     Rate and Rhythm: Normal rate.  Pulmonary:     Breath sounds: Wheezing and rhonchi present.  Musculoskeletal:        General: Normal range of motion.     Cervical back: Normal range of motion.  Neurological:     General: No focal deficit present.     Mental Status: She is alert and oriented to person, place, and time.  Psychiatric:        Mood and Affect: Mood normal.        Behavior: Behavior normal.        Thought  Content: Thought content normal.        Judgment: Judgment normal.     No results found for any visits on 06/28/23.  Recent Results (from the past 2160 hours)  Lipid panel     Status: Abnormal   Collection Time: 05/17/23  3:16 PM  Result Value Ref Range   Cholesterol 191 0 - 200 mg/dL   Triglycerides 962 <952 mg/dL   HDL 52 >84 mg/dL   Total CHOL/HDL Ratio 3.7 RATIO   VLDL 23 0 - 40 mg/dL   LDL Cholesterol 132 (H) 0 - 99 mg/dL    Comment:        Total Cholesterol/HDL:CHD Risk Coronary Heart Disease Risk Table                     Men   Women  1/2 Average Risk   3.4   3.3  Average Risk       5.0   4.4  2 X Average Risk   9.6   7.1  3 X Average Risk  23.4   11.0        Use the calculated Patient Ratio above and the CHD Risk Table to determine the patient's CHD Risk.  ATP III CLASSIFICATION (LDL):  <100     mg/dL   Optimal  086-578  mg/dL   Near or Above                    Optimal  130-159  mg/dL   Borderline  469-629  mg/dL   High  >528     mg/dL   Very High Performed at [facility], [address] [place], [place] [address]   VITAMIN D 25 Hydroxy (Vit-D Deficiency, Fractures)     Status: None   Collection Time: 05/17/23  3:16 PM  Result Value Ref Range   Vit D, 25-Hydroxy 33.01 30 - 100 ng/mL    Comment: (NOTE) Vitamin D deficiency has been defined by the Institute of Medicine  and an Endocrine Society practice guideline as a level of serum 25-OH  vitamin D less than 20 ng/mL (1,2). The Endocrine Society went on to  further define vitamin D insufficiency as a level between 21 and 29  ng/mL (2).  1. IOM (Institute of Medicine). 2010. Dietary reference intakes for  calcium and D. [place]: [employer]. 2. Holick MF, Binkley Hornbeak, Bischoff-Ferrari HA, et al. Evaluation,  treatment, and prevention of vitamin D deficiency: an Endocrine  Society clinical practice guideline, JCEM. 2011 Jul; 96(7): 1911-30.  Performed at [facility], [address] [place], [place] [address]   Comprehensive metabolic panel     Status: Abnormal   Collection Time: 05/17/23  3:16 PM  Result Value Ref Range   Sodium 136 135 - 145 mmol/L   Potassium 3.9 3.5 - 5.1 mmol/L   Chloride 106 98 - 111 mmol/L   CO2 19 (L) 22 - 32 mmol/L   Glucose, Bld 84 70 - 99 mg/dL    Comment: Glucose reference range applies only to samples taken after fasting for at least 8 hours.   BUN 14 6 - 20 mg/dL   Creatinine, Ser 7.25 0.44 - 1.00 mg/dL   Calcium 9.0 8.9 - 36.6 mg/dL   Total Protein 7.2 6.5 - 8.1 g/dL   Albumin 4.1 3.5 - 5.0 g/dL   AST 21 15 - 41 U/L   ALT 12 0 - 44 U/L   Alkaline Phosphatase 77 38 - 126 U/L   Total Bilirubin 0.6 <1.2 mg/dL   GFR, Estimated >44 >03 mL/min    Comment: (NOTE) Calculated using the CKD-EPI Creatinine Equation (2021)    Anion gap 11 5 - 15    Comment: Performed at [facility], [address] [place], [place] [address]  Hemoglobin A1c     Status: None   Collection Time: 05/17/23  3:16 PM  Result Value Ref Range   Hgb A1c MFr Bld 5.3 4.8 - 5.6 %    Comment: (NOTE) Pre diabetes:          5.7%-6.4%  Diabetes:              >6.4%  Glycemic control for   <7.0% adults with diabetes    Mean Plasma Glucose 105.41 mg/dL    Comment: Performed at [facility], [address], [place], [place] [address]  Vitamin B12     Status: None   Collection Time: 05/17/23  3:16 PM  Result Value Ref Range   Vitamin B-12 264 180 - 914 pg/mL    Comment: (NOTE) This assay is not validated for testing neonatal or myeloproliferative syndrome specimens for Vitamin B12 levels. Performed  at [facility], [address], [place], [place] [address]   TSH     Status: None   Collection Time: 05/17/23  3:16 PM  Result Value Ref Range   TSH 1.337 0.350 - 4.500 uIU/mL    Comment: Performed by a 3rd Generation assay with a functional sensitivity of <=0.01 uIU/mL. Performed at [facility], [address] [place], [place] [address]   T4, free     Status: None   Collection Time: 05/17/23  3:16 PM  Result Value Ref Range   Free T4 1.12 0.61 - 1.12 ng/dL    Comment: (NOTE) Biotin ingestion may interfere with free T4 tests. If the results are inconsistent with the TSH level, previous test results, or the clinical presentation, then consider biotin interference. If needed, order repeat testing after stopping biotin. Performed at [facility], [address] [place], [place] [address]   T3, free     Status: None   Collection Time: 05/17/23  3:16 PM  Result Value Ref Range   T3, Free 2.6 2.0 - 4.4 pg/mL    Comment: (NOTE) Performed At: [facility] [place] [address] [place], [place] [id number] Jolene Schimke MD [id number]   CBC with Differential/Platelet     Status: Abnormal   Collection Time: 05/17/23  3:16 PM  Result Value Ref Range   WBC 10.0 4.0 - 10.5 K/uL   RBC 4.11 3.87 - 5.11 MIL/uL   Hemoglobin 13.8 12.0 - 15.0 g/dL   HCT 64.3 32.9 - 51.8 %   MCV 95.6 80.0 - 100.0 fL   MCH 33.6 26.0 - 34.0 pg   MCHC 35.1 30.0 - 36.0 g/dL   RDW 84.1 66.0 - 63.0 %   Platelets 341 150 - 400 K/uL   nRBC 0.0 0.0 - 0.2 %   Neutrophils Relative % 45 %   Neutro Abs 4.4 1.7 - 7.7 K/uL   Lymphocytes Relative 43 %   Lymphs Abs 4.3 (H) 0.7 - 4.0 K/uL   Monocytes Relative 10 %   Monocytes Absolute 1.0 0.1 - 1.0 K/uL   Eosinophils Relative 2 %   Eosinophils Absolute 0.2 0.0 - 0.5 K/uL   Basophils Relative 0 %   Basophils Absolute 0.0 0.0 - 0.1 K/uL   Smear Review MORPHOLOGY UNREMARKABLE    Immature Granulocytes 0 %   Abs Immature Granulocytes 0.03 0.00 - 0.07 K/uL   Reactive, Benign Lymphocytes PRESENT    Tear Drop Cells PRESENT     Comment: Performed at [facility], [address] [place], [place] [address]  Iron and TIBC     Status: None   Collection Time: 05/17/23  3:16 PM  Result Value Ref Range   Iron 125 28 - 170 ug/dL   TIBC 932 355 - 732  ug/dL   Saturation Ratios 28 10.4 - 31.8 %   UIBC 319 ug/dL    Comment: Performed at [facility], [address] [place], [place] [address]  Ferritin     Status: None   Collection Time: 05/17/23  3:16 PM  Result Value Ref Range   Ferritin 14 11 - 307 ng/mL    Comment: Performed at [facility], [address] [place], [place] [address]  POCT XPERT XPRESS SARS COVID-2/FLU/RSV     Status: Normal   Collection Time: 06/18/23  2:01 PM  Result Value Ref Range   SARS Coronavirus 2 neg    FLU A neg    FLU B  neg    RSV RNA, PCR neg     Allergies as of 06/28/2023       Reactions   No Known Allergies         Medication List        Accurate as of June 28, 2023  5:25 PM. If you have any questions, ask your nurse or doctor.          STOP taking these medications    amoxicillin-clavulanate 875-125 MG tablet Commonly known as: AUGMENTIN Stopped by: Miki Kins       TAKE these medications    albuterol 1.25 MG/3ML nebulizer solution Commonly known as: ACCUNEB Take 3 mLs by nebulization 4 (four) times daily for shortness of breath   Ventolin HFA 108 (90 Base) MCG/ACT inhaler Generic drug: albuterol Inhale 2-4 puffs into the lungs every 4 (four) hours as needed.   buPROPion 150 MG 24 hr tablet Commonly known as: WELLBUTRIN XL Take 1 tablet (150 mg total) by mouth daily.   cetirizine 10 MG tablet Commonly known as: ZYRTEC Take 1 tablet (10 mg total) by mouth once daily.   chlorpheniramine-HYDROcodone 10-8 MG/5ML Commonly known as: TUSSIONEX Take 5 mLs by mouth every 12 (twelve) hours as needed for cough.   ciclopirox 8 % solution Commonly known as: PENLAC Apply 1 Application topically at bedtime. Apply over nail and surrounding skin. Apply daily over previous coat. After seven (7) days, may remove with alcohol and continue cycle.   cyanocobalamin 1000 MCG/ML injection Commonly known as: VITAMIN B12 Inject 1 mL (1,000 mcg total) into  the muscle every 14 (fourteen) days for 6 doses.   doxycycline 100 MG capsule Commonly known as: VIBRAMYCIN Take 1 capsule (100 mg total) by mouth 2 (two) times daily. Started by: Miki Kins   Ensure Take 237 mLs by mouth 2 (two) times daily between meals.   famotidine 20 MG tablet Commonly known as: PEPCID Take 1 tablet (20 mg total) by mouth daily.   fexofenadine 180 MG tablet Commonly known as: ALLEGRA Take 1 tablet (180 mg total) by mouth daily.   fluticasone 50 MCG/ACT nasal spray Commonly known as: FLONASE Place 2 sprays into both nostrils daily.   gabapentin 600 MG tablet Commonly known as: NEURONTIN Take 1 tablet (600 mg total) by mouth 3 (three) times daily.   levofloxacin 500 MG tablet Commonly known as: LEVAQUIN Take 1 tablet (500 mg total) by mouth daily.   Nurtec 75 MG Tbdp Generic drug: Rimegepant Sulfate Take 1 tablet (75 mg total) by mouth as needed.   propranolol 10 MG tablet Commonly known as: INDERAL Take 1 tablet (10 mg total) by mouth 2 (two) times daily.   Symbicort 160-4.5 MCG/ACT inhaler Generic drug: budesonide-formoterol Inhale 2 puffs into the lungs in the morning and at bedtime.   terbinafine 250 MG tablet Commonly known as: LAMISIL Take 1 tablet (250 mg total) by mouth daily.   tiZANidine 4 MG tablet Commonly known as: ZANAFLEX Take 1 tablet (4 mg total) by mouth every 6 (six) hours as needed for muscle spasms.   topiramate 50 MG tablet Commonly known as: TOPAMAX Take 1 tablet (50 mg total) by mouth 2 (two) times daily.   traZODone 50 MG tablet Commonly known as: DESYREL Take 1 tablet (50 mg total) by mouth at bedtime as needed for sleep.   valACYclovir 500 MG tablet Commonly known as: Valtrex Take 1 tablet (500 mg total) by mouth 2 (two) times daily.   Zenpep 10000-32000  units Cpep Generic drug: Pancrelipase (Lip-Prot-Amyl) Take 2 capsules by mouth 3 (three) times daily with meals AND 1 capsule with snacks.             Assessment & Plan:   Problem List Items Addressed This Visit       Other   B12 deficiency due to diet - Primary   B12 injection given in office today.  Return for next injection per provider instructions.       Other Visit Diagnoses       Acute upper respiratory infection       given that antibiotics have not been effective, will try a different one.  Will follow up again in 10 days.        Return in about 11 days (around 07/09/2023).  Total time spent: 20 minutes  Miki Kins, FNP  06/28/2023   This document may have been prepared by [facility] Voice Recognition software and as such may include unintentional dictation errors.

## 2023-07-05 ENCOUNTER — Telehealth: Payer: Self-pay | Admitting: Family

## 2023-07-05 NOTE — Telephone Encounter (Signed)
Patient called asking about her paperwork from [facility]. Patient stated that it needed to be faxed by today 07/05/23. I advised patient that you were out sick & should be back in office tomorrow, but if not you would get it taken care of quickly for her.

## 2023-07-09 ENCOUNTER — Other Ambulatory Visit: Payer: Self-pay

## 2023-07-09 ENCOUNTER — Ambulatory Visit (INDEPENDENT_AMBULATORY_CARE_PROVIDER_SITE_OTHER): Payer: Medicaid Other | Admitting: Family

## 2023-07-09 ENCOUNTER — Encounter: Payer: Self-pay | Admitting: Family

## 2023-07-09 VITALS — BP 120/84 | HR 73 | Ht 63.0 in | Wt 115.0 lb

## 2023-07-09 DIAGNOSIS — E538 Deficiency of other specified B group vitamins: Secondary | ICD-10-CM

## 2023-07-09 DIAGNOSIS — Z23 Encounter for immunization: Secondary | ICD-10-CM

## 2023-07-09 DIAGNOSIS — J069 Acute upper respiratory infection, unspecified: Secondary | ICD-10-CM | POA: Diagnosis not present

## 2023-07-09 MED ORDER — ZENPEP 10000-32000 UNITS PO CPEP
ORAL_CAPSULE | ORAL | 0 refills | Status: DC
  Filled 2023-07-09: qty 270, 30d supply, fill #0

## 2023-07-09 MED ORDER — BREZTRI AEROSPHERE 160-9-4.8 MCG/ACT IN AERO
2.0000 | INHALATION_SPRAY | Freq: Two times a day (BID) | RESPIRATORY_TRACT | 3 refills | Status: DC
  Filled 2023-07-09: qty 10.7, 30d supply, fill #0

## 2023-07-09 MED ORDER — CYANOCOBALAMIN 1000 MCG/ML IJ SOLN
1000.0000 ug | Freq: Once | INTRAMUSCULAR | Status: AC
  Administered 2023-07-09: 1000 ug via INTRAMUSCULAR
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
## 2023-07-10 ENCOUNTER — Other Ambulatory Visit: Payer: Self-pay

## 2023-07-11 NOTE — Telephone Encounter (Signed)
Patient left VM that she called [facility] and they will not fill out the papers for her dating back to 1/1, they told her that her PCP would have to. She needs the papers to be dated from 1/1-2/3 and they need to be faxed back by tomorrow.

## 2023-07-13 ENCOUNTER — Other Ambulatory Visit: Payer: Self-pay

## 2023-07-14 ENCOUNTER — Encounter: Payer: Self-pay | Admitting: Family

## 2023-07-14 NOTE — Assessment & Plan Note (Signed)
 B12 injection given in office today.  Return for next injection per provider instructions.

## 2023-07-23 ENCOUNTER — Other Ambulatory Visit: Payer: Self-pay | Admitting: Family

## 2023-07-23 ENCOUNTER — Other Ambulatory Visit: Payer: Self-pay

## 2023-07-23 DIAGNOSIS — R251 Tremor, unspecified: Secondary | ICD-10-CM

## 2023-07-24 ENCOUNTER — Other Ambulatory Visit: Payer: Self-pay

## 2023-07-24 MED ORDER — TOPIRAMATE 50 MG PO TABS
50.0000 mg | ORAL_TABLET | Freq: Two times a day (BID) | ORAL | 1 refills | Status: DC
  Filled 2023-07-24: qty 60, 30d supply, fill #0
  Filled 2023-08-23 (×2): qty 60, 30d supply, fill #1

## 2023-07-24 MED FILL — Albuterol Sulfate Inhal Aero 108 MCG/ACT (90MCG Base Equiv): RESPIRATORY_TRACT | 16 days supply | Qty: 18 | Fill #0 | Status: AC

## 2023-07-24 MED FILL — Gabapentin Tab 600 MG: ORAL | 30 days supply | Qty: 90 | Fill #0 | Status: AC

## 2023-07-27 ENCOUNTER — Other Ambulatory Visit: Payer: Self-pay | Admitting: Family

## 2023-07-27 ENCOUNTER — Other Ambulatory Visit: Payer: Self-pay

## 2023-07-27 MED ORDER — FLUTICASONE FUROATE-VILANTEROL 100-25 MCG/ACT IN AEPB
INHALATION_SPRAY | RESPIRATORY_TRACT | 3 refills | Status: DC
  Filled 2023-07-27: qty 60, 30d supply, fill #0

## 2023-08-02 ENCOUNTER — Other Ambulatory Visit: Payer: Self-pay

## 2023-08-10 ENCOUNTER — Other Ambulatory Visit: Payer: Self-pay

## 2023-08-10 ENCOUNTER — Ambulatory Visit: Payer: Medicaid Other | Admitting: Family

## 2023-08-10 ENCOUNTER — Encounter: Payer: Self-pay | Admitting: Family

## 2023-08-10 VITALS — BP 126/84 | HR 53 | Ht 63.0 in | Wt 116.2 lb

## 2023-08-10 DIAGNOSIS — R5383 Other fatigue: Secondary | ICD-10-CM

## 2023-08-10 DIAGNOSIS — E538 Deficiency of other specified B group vitamins: Secondary | ICD-10-CM | POA: Diagnosis not present

## 2023-08-10 DIAGNOSIS — M79671 Pain in right foot: Secondary | ICD-10-CM

## 2023-08-10 DIAGNOSIS — M79672 Pain in left foot: Secondary | ICD-10-CM | POA: Diagnosis not present

## 2023-08-10 MED ORDER — FLUTICASONE-SALMETEROL 100-50 MCG/ACT IN AEPB
1.0000 | INHALATION_SPRAY | Freq: Two times a day (BID) | RESPIRATORY_TRACT | 3 refills | Status: DC
  Filled 2023-08-10: qty 60, 30d supply, fill #0
  Filled 2023-09-13: qty 60, 30d supply, fill #1
  Filled 2023-12-04: qty 60, 30d supply, fill #2
  Filled 2024-02-06: qty 60, 30d supply, fill #3

## 2023-08-10 MED ORDER — IBUPROFEN 800 MG PO TABS
800.0000 mg | ORAL_TABLET | Freq: Three times a day (TID) | ORAL | 1 refills | Status: DC | PRN
  Filled 2023-08-10: qty 90, 30d supply, fill #0
  Filled 2023-12-04: qty 90, 30d supply, fill #1

## 2023-08-10 MED ORDER — CYANOCOBALAMIN 1000 MCG/ML IJ SOLN
1000.0000 ug | INTRAMUSCULAR | 3 refills | Status: AC
  Filled 2023-08-10 – 2023-08-23 (×2): qty 2, 28d supply, fill #0
  Filled 2023-12-04: qty 2, 28d supply, fill #1
  Filled 2024-03-27: qty 2, 28d supply, fill #2

## 2023-08-10 MED ORDER — CYANOCOBALAMIN 1000 MCG/ML IJ SOLN
1000.0000 ug | Freq: Once | INTRAMUSCULAR | Status: AC
  Administered 2023-08-10: 1000 ug via INTRAMUSCULAR
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
## 2023-08-16 ENCOUNTER — Other Ambulatory Visit: Payer: Self-pay

## 2023-08-17 LAB — CBC WITH DIFFERENTIAL/PLATELET
Basophils Absolute: 0.1 10*3/uL (ref 0.0–0.2)
Basos: 1 %
EOS (ABSOLUTE): 0.2 10*3/uL (ref 0.0–0.4)
Eos: 2 %
Hematocrit: 41.7 % (ref 34.0–46.6)
Hemoglobin: 14.2 g/dL (ref 11.1–15.9)
Immature Grans (Abs): 0 10*3/uL (ref 0.0–0.1)
Immature Granulocytes: 0 %
Lymphocytes Absolute: 4.5 10*3/uL — ABNORMAL HIGH (ref 0.7–3.1)
Lymphs: 43 %
MCH: 33 pg (ref 26.6–33.0)
MCHC: 34.1 g/dL (ref 31.5–35.7)
MCV: 97 fL (ref 79–97)
Monocytes Absolute: 0.8 10*3/uL (ref 0.1–0.9)
Monocytes: 8 %
Neutrophils Absolute: 4.6 10*3/uL (ref 1.4–7.0)
Neutrophils: 46 %
Platelets: 390 10*3/uL (ref 150–450)
RBC: 4.3 x10E6/uL (ref 3.77–5.28)
RDW: 13.2 % (ref 11.7–15.4)
WBC: 10.3 10*3/uL (ref 3.4–10.8)

## 2023-08-17 LAB — INTRINSIC FACTOR ANTIBODIES: Intrinsic Factor Abs, Serum: 19.9 [AU]/ml — ABNORMAL HIGH (ref 0.0–1.1)

## 2023-08-17 LAB — TSH+T4F+T3FREE
Free T4: 1.38 ng/dL (ref 0.82–1.77)
T3, Free: 3 pg/mL (ref 2.0–4.4)
TSH: 1.23 u[IU]/mL (ref 0.450–4.500)

## 2023-08-17 LAB — METHYLMALONIC ACID, SERUM: Methylmalonic Acid: 615 nmol/L — ABNORMAL HIGH (ref 0–378)

## 2023-08-23 ENCOUNTER — Other Ambulatory Visit: Payer: Self-pay

## 2023-08-23 ENCOUNTER — Other Ambulatory Visit: Payer: Self-pay | Admitting: Family

## 2023-08-23 MED FILL — Bupropion HCl Tab ER 24HR 150 MG: ORAL | 30 days supply | Qty: 30 | Fill #0 | Status: CN

## 2023-08-23 MED FILL — Gabapentin Tab 600 MG: ORAL | 30 days supply | Qty: 90 | Fill #1 | Status: CN

## 2023-08-23 MED FILL — Gabapentin Tab 600 MG: ORAL | 30 days supply | Qty: 90 | Fill #1 | Status: AC

## 2023-08-24 ENCOUNTER — Ambulatory Visit: Payer: Medicaid Other

## 2023-08-24 ENCOUNTER — Other Ambulatory Visit: Payer: Self-pay

## 2023-09-04 ENCOUNTER — Other Ambulatory Visit: Payer: Self-pay

## 2023-09-13 ENCOUNTER — Other Ambulatory Visit: Payer: Self-pay | Admitting: Family

## 2023-09-13 ENCOUNTER — Other Ambulatory Visit: Payer: Self-pay

## 2023-09-13 ENCOUNTER — Ambulatory Visit (INDEPENDENT_AMBULATORY_CARE_PROVIDER_SITE_OTHER): Admitting: Family

## 2023-09-13 ENCOUNTER — Telehealth: Payer: Self-pay | Admitting: Family

## 2023-09-13 DIAGNOSIS — R251 Tremor, unspecified: Secondary | ICD-10-CM

## 2023-09-13 DIAGNOSIS — E538 Deficiency of other specified B group vitamins: Secondary | ICD-10-CM | POA: Diagnosis not present

## 2023-09-13 MED ORDER — CYANOCOBALAMIN 1000 MCG/ML IJ SOLN
1000.0000 ug | Freq: Once | INTRAMUSCULAR | Status: AC
  Administered 2023-09-13: 1000 ug via INTRAMUSCULAR

## 2023-09-13 MED FILL — Albuterol Sulfate Inhal Aero 108 MCG/ACT (90MCG Base Equiv): RESPIRATORY_TRACT | 16 days supply | Qty: 18 | Fill #0 | Status: AC

## 2023-09-13 MED FILL — Gabapentin Tab 600 MG: ORAL | 30 days supply | Qty: 90 | Fill #0 | Status: CN

## 2023-09-13 MED FILL — Rimegepant Sulfate Tab Disint 75 MG: ORAL | Qty: 16 | Fill #0 | Status: CN

## 2023-09-13 MED FILL — Topiramate Tab 50 MG: ORAL | 30 days supply | Qty: 60 | Fill #0 | Status: CN

## 2023-09-13 NOTE — Progress Notes (Signed)
   CHIEF COMPLAINT  B12 Shot     REASON FOR VISIT  B12 Injection     ASSESSMENT  B12 Deficiency, Unspecified     PLAN  Diagnoses and all orders for this visit:  B12 deficiency due to diet -     cyanocobalamin (VITAMIN B12) injection 1,000 mcg     Pt. given B12 injection in clinic.  Return for next injection per provider instructions.   Total time spent: 5 minutes  Miki Kins, FNP  09/13/2023

## 2023-09-13 NOTE — Telephone Encounter (Signed)
 Patient was in for her B12 shot and stated that she thinks she has a sinus infection because she has sinus pain/pressure near her nose and has pressure built up behind her ears. She also has a raised area on her nose that she would like to get looked at by dermatology. Please advise.

## 2023-09-18 ENCOUNTER — Other Ambulatory Visit: Payer: Self-pay

## 2023-09-18 DIAGNOSIS — D485 Neoplasm of uncertain behavior of skin: Secondary | ICD-10-CM

## 2023-09-18 MED ORDER — AMOXICILLIN-POT CLAVULANATE 875-125 MG PO TABS
1.0000 | ORAL_TABLET | Freq: Two times a day (BID) | ORAL | 0 refills | Status: DC
  Filled 2023-09-18: qty 20, 10d supply, fill #0

## 2023-09-21 ENCOUNTER — Other Ambulatory Visit: Payer: Self-pay | Admitting: Family

## 2023-09-21 ENCOUNTER — Other Ambulatory Visit: Payer: Self-pay

## 2023-09-21 MED FILL — Gabapentin Tab 600 MG: ORAL | 30 days supply | Qty: 90 | Fill #0 | Status: AC

## 2023-09-21 MED FILL — Valacyclovir HCl Tab 500 MG: ORAL | 5 days supply | Qty: 10 | Fill #0 | Status: AC

## 2023-09-21 MED FILL — Bupropion HCl Tab ER 24HR 150 MG: ORAL | 30 days supply | Qty: 30 | Fill #0 | Status: AC

## 2023-09-21 MED FILL — Topiramate Tab 50 MG: ORAL | 30 days supply | Qty: 60 | Fill #0 | Status: AC

## 2023-09-21 MED FILL — Spacer/Aerosol-Holding Chambers - Device: 30 days supply | Qty: 1 | Fill #0 | Status: AC

## 2023-09-21 MED FILL — Rimegepant Sulfate Tab Disint 75 MG: ORAL | 30 days supply | Qty: 16 | Fill #0 | Status: CN

## 2023-09-27 ENCOUNTER — Ambulatory Visit (INDEPENDENT_AMBULATORY_CARE_PROVIDER_SITE_OTHER): Admitting: Family

## 2023-09-27 ENCOUNTER — Encounter: Payer: Self-pay | Admitting: Family

## 2023-09-27 DIAGNOSIS — E538 Deficiency of other specified B group vitamins: Secondary | ICD-10-CM | POA: Diagnosis not present

## 2023-09-27 MED ORDER — CYANOCOBALAMIN 1000 MCG/ML IJ SOLN
1000.0000 ug | Freq: Once | INTRAMUSCULAR | Status: AC
  Administered 2023-09-27: 1000 ug via INTRAMUSCULAR

## 2023-09-27 NOTE — Progress Notes (Signed)
   CHIEF COMPLAINT  B12 Shot     REASON FOR VISIT  B12 Injection     ASSESSMENT  B12 Deficiency, Unspecified     PLAN  Diagnoses and all orders for this visit:  B12 deficiency due to diet -     cyanocobalamin (VITAMIN B12) injection 1,000 mcg     Pt. given B12 injection in clinic.  Return for next injection per provider instructions.   Total time spent: 5 minutes  Trenda Frisk, FNP  09/27/2023

## 2023-10-05 ENCOUNTER — Ambulatory Visit: Admitting: Family

## 2023-10-11 ENCOUNTER — Ambulatory Visit

## 2023-10-12 ENCOUNTER — Ambulatory Visit: Admitting: Family

## 2023-10-12 VITALS — BP 102/62 | HR 59 | Ht 63.0 in | Wt 117.2 lb

## 2023-10-12 DIAGNOSIS — J329 Chronic sinusitis, unspecified: Secondary | ICD-10-CM

## 2023-10-12 DIAGNOSIS — D485 Neoplasm of uncertain behavior of skin: Secondary | ICD-10-CM | POA: Diagnosis not present

## 2023-10-12 DIAGNOSIS — R7303 Prediabetes: Secondary | ICD-10-CM | POA: Diagnosis not present

## 2023-10-12 DIAGNOSIS — R5383 Other fatigue: Secondary | ICD-10-CM

## 2023-10-12 DIAGNOSIS — D510 Vitamin B12 deficiency anemia due to intrinsic factor deficiency: Secondary | ICD-10-CM

## 2023-10-12 DIAGNOSIS — E782 Mixed hyperlipidemia: Secondary | ICD-10-CM | POA: Diagnosis not present

## 2023-10-12 DIAGNOSIS — E559 Vitamin D deficiency, unspecified: Secondary | ICD-10-CM

## 2023-10-12 MED ORDER — CYANOCOBALAMIN 1000 MCG/ML IJ SOLN
1000.0000 ug | Freq: Once | INTRAMUSCULAR | Status: AC
  Administered 2023-10-12: 1000 ug via INTRAMUSCULAR

## 2023-10-13 LAB — CMP14+EGFR
ALT: 12 IU/L (ref 0–32)
AST: 15 IU/L (ref 0–40)
Albumin: 4.1 g/dL (ref 3.8–4.9)
Alkaline Phosphatase: 78 IU/L (ref 44–121)
BUN/Creatinine Ratio: 7 — ABNORMAL LOW (ref 9–23)
BUN: 8 mg/dL (ref 6–24)
Bilirubin Total: 0.2 mg/dL (ref 0.0–1.2)
CO2: 20 mmol/L (ref 20–29)
Calcium: 9.7 mg/dL (ref 8.7–10.2)
Chloride: 103 mmol/L (ref 96–106)
Creatinine, Ser: 1.2 mg/dL — ABNORMAL HIGH (ref 0.57–1.00)
Globulin, Total: 3.1 g/dL (ref 1.5–4.5)
Glucose: 90 mg/dL (ref 70–99)
Potassium: 4.3 mmol/L (ref 3.5–5.2)
Sodium: 141 mmol/L (ref 134–144)
Total Protein: 7.2 g/dL (ref 6.0–8.5)
eGFR: 53 mL/min/{1.73_m2} — ABNORMAL LOW (ref >59)

## 2023-10-13 LAB — CBC WITH DIFFERENTIAL/PLATELET
Basophils Absolute: 0 10*3/uL (ref 0.0–0.2)
Basos: 1 %
EOS (ABSOLUTE): 0.2 10*3/uL (ref 0.0–0.4)
Eos: 4 %
Hematocrit: 42.1 % (ref 34.0–46.6)
Hemoglobin: 14.8 g/dL (ref 11.1–15.9)
Immature Grans (Abs): 0 10*3/uL (ref 0.0–0.1)
Immature Granulocytes: 0 %
Lymphocytes Absolute: 1.4 10*3/uL (ref 0.7–3.1)
Lymphs: 28 %
MCH: 33.5 pg — ABNORMAL HIGH (ref 26.6–33.0)
MCHC: 35.2 g/dL (ref 31.5–35.7)
MCV: 95 fL (ref 79–97)
Monocytes Absolute: 0.5 10*3/uL (ref 0.1–0.9)
Monocytes: 10 %
Neutrophils Absolute: 2.9 10*3/uL (ref 1.4–7.0)
Neutrophils: 57 %
Platelets: 305 10*3/uL (ref 150–450)
RBC: 4.42 x10E6/uL (ref 3.77–5.28)
RDW: 13.1 % (ref 11.7–15.4)
WBC: 5.1 10*3/uL (ref 3.4–10.8)

## 2023-10-13 LAB — HEMOGLOBIN A1C
Est. average glucose Bld gHb Est-mCnc: 103 mg/dL
Hgb A1c MFr Bld: 5.2 % (ref 4.8–5.6)

## 2023-10-13 LAB — IRON,TIBC AND FERRITIN PANEL
Ferritin: 51 ng/mL (ref 15–150)
Iron Saturation: 20 % (ref 15–55)
Iron: 60 ug/dL (ref 27–159)
Total Iron Binding Capacity: 303 ug/dL (ref 250–450)
UIBC: 243 ug/dL (ref 131–425)

## 2023-10-13 LAB — LIPID PANEL
Chol/HDL Ratio: 2.8 ratio (ref 0.0–4.4)
Cholesterol, Total: 137 mg/dL (ref 100–199)
HDL: 49 mg/dL (ref >39)
LDL Chol Calc (NIH): 76 mg/dL (ref 0–99)
Triglycerides: 56 mg/dL (ref 0–149)
VLDL Cholesterol Cal: 12 mg/dL (ref 5–40)

## 2023-10-13 LAB — VITAMIN D 25 HYDROXY (VIT D DEFICIENCY, FRACTURES): Vit D, 25-Hydroxy: 20.1 ng/mL — ABNORMAL LOW (ref 30.0–100.0)

## 2023-10-13 LAB — TSH+T4F+T3FREE
Free T4: 0.99 ng/dL (ref 0.82–1.77)
T3, Free: 3.2 pg/mL (ref 2.0–4.4)
TSH: 0.555 u[IU]/mL (ref 0.450–4.500)

## 2023-10-13 LAB — VITAMIN B12: Vitamin B-12: 336 pg/mL (ref 232–1245)

## 2023-10-15 ENCOUNTER — Telehealth: Payer: Self-pay | Admitting: Family

## 2023-10-15 ENCOUNTER — Other Ambulatory Visit: Payer: Self-pay

## 2023-10-15 MED FILL — Topiramate Tab 50 MG: ORAL | 30 days supply | Qty: 60 | Fill #1 | Status: AC

## 2023-10-15 MED FILL — Gabapentin Tab 600 MG: ORAL | 30 days supply | Qty: 90 | Fill #1 | Status: AC

## 2023-10-15 NOTE — Telephone Encounter (Signed)
 Patient left VM that she "feels like she got hit by a train". She would like her results from Friday and wants to know if there is anything to explain why she is feeling so bad. Please advise.

## 2023-10-16 ENCOUNTER — Other Ambulatory Visit: Payer: Self-pay

## 2023-10-16 MED FILL — Rimegepant Sulfate Tab Disint 75 MG: ORAL | 30 days supply | Qty: 8 | Fill #0 | Status: CN

## 2023-10-16 NOTE — Telephone Encounter (Signed)
 Patient called again wanting her lab results. Please advise.

## 2023-10-17 ENCOUNTER — Other Ambulatory Visit: Payer: Self-pay

## 2023-10-17 ENCOUNTER — Other Ambulatory Visit: Payer: Self-pay | Admitting: Family

## 2023-10-17 MED ORDER — VITAMIN D (ERGOCALCIFEROL) 1.25 MG (50000 UNIT) PO CAPS
50000.0000 [IU] | ORAL_CAPSULE | ORAL | 1 refills | Status: DC
  Filled 2023-10-17: qty 12, 84d supply, fill #0
  Filled 2024-01-03: qty 12, 84d supply, fill #1

## 2023-10-17 NOTE — Telephone Encounter (Signed)
 Patient called to get lab results.  Per [name], her vit D is low and she is dehydrated.

## 2023-10-18 ENCOUNTER — Telehealth: Payer: Self-pay | Admitting: Family

## 2023-10-18 ENCOUNTER — Other Ambulatory Visit: Payer: Self-pay

## 2023-10-18 NOTE — Telephone Encounter (Signed)
 Patient left VM asking for another medication for her tremors. Also said [name] was going to send her a referral for the veins in her legs and feet. Please advise.

## 2023-10-22 ENCOUNTER — Telehealth: Payer: Self-pay

## 2023-10-22 ENCOUNTER — Other Ambulatory Visit: Payer: Self-pay

## 2023-10-22 ENCOUNTER — Encounter: Payer: Self-pay | Admitting: Cardiology

## 2023-10-22 ENCOUNTER — Ambulatory Visit
Admission: RE | Admit: 2023-10-22 | Discharge: 2023-10-22 | Disposition: A | Discharge status: Home / Self Care | Source: Ambulatory Visit | Attending: Cardiology | Admitting: Cardiology

## 2023-10-22 ENCOUNTER — Ambulatory Visit: Admitting: Cardiology

## 2023-10-22 ENCOUNTER — Ambulatory Visit
Admission: RE | Admit: 2023-10-22 | Discharge: 2023-10-22 | Disposition: A | Discharge status: Home / Self Care | Attending: Cardiology | Admitting: Cardiology

## 2023-10-22 VITALS — BP 110/60 | HR 56 | Temp 96.2°F | Ht 63.0 in | Wt 115.8 lb

## 2023-10-22 DIAGNOSIS — J069 Acute upper respiratory infection, unspecified: Secondary | ICD-10-CM | POA: Diagnosis present

## 2023-10-22 DIAGNOSIS — J301 Allergic rhinitis due to pollen: Secondary | ICD-10-CM

## 2023-10-22 DIAGNOSIS — Z0130 Encounter for examination of blood pressure without abnormal findings: Secondary | ICD-10-CM

## 2023-10-22 MED ORDER — FEXOFENADINE HCL 180 MG PO TABS
180.0000 mg | ORAL_TABLET | Freq: Every day | ORAL | 1 refills | Status: DC
  Filled 2023-10-22: qty 30, 30d supply, fill #0

## 2023-10-22 MED ORDER — LEVOFLOXACIN 750 MG PO TABS
750.0000 mg | ORAL_TABLET | Freq: Every day | ORAL | 0 refills | Status: DC
Start: 2023-10-22 — End: 2023-10-27
  Filled 2023-10-22: qty 5, 5d supply, fill #0

## 2023-10-22 MED ORDER — BENZONATATE 100 MG PO CAPS
100.0000 mg | ORAL_CAPSULE | Freq: Three times a day (TID) | ORAL | 1 refills | Status: DC | PRN
  Filled 2023-10-22: qty 30, 10d supply, fill #0
  Filled 2023-10-31: qty 30, 10d supply, fill #1

## 2023-10-22 MED ORDER — ALBUTEROL SULFATE 1.25 MG/3ML IN NEBU
3.0000 mL | INHALATION_SOLUTION | Freq: Four times a day (QID) | RESPIRATORY_TRACT | 3 refills | Status: AC
  Filled 2023-10-22: qty 300, 25d supply, fill #0
  Filled 2024-01-24: qty 300, 25d supply, fill #1

## 2023-10-22 MED ORDER — FLUTICASONE PROPIONATE 50 MCG/ACT NA SUSP
2.0000 | Freq: Every day | NASAL | 12 refills | Status: AC
  Filled 2023-10-22: qty 16, 30d supply, fill #0
  Filled 2024-04-18: qty 16, 30d supply, fill #1

## 2023-10-22 NOTE — Telephone Encounter (Signed)
 Pharmacist called stating that with the patient kidney fx she should really be on the levofloxacin   every other day is this okay to send for every other day?
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
## 2023-10-23 ENCOUNTER — Other Ambulatory Visit: Payer: Self-pay

## 2023-10-23 ENCOUNTER — Other Ambulatory Visit (HOSPITAL_COMMUNITY): Payer: Self-pay

## 2023-10-23 ENCOUNTER — Encounter: Payer: Self-pay | Admitting: Cardiology

## 2023-10-23 ENCOUNTER — Ambulatory Visit: Payer: Self-pay | Admitting: Cardiology

## 2023-10-23 MED ORDER — LEVOFLOXACIN 750 MG PO TABS
ORAL_TABLET | ORAL | 0 refills | Status: DC
  Filled 2023-10-23: qty 5, fill #0

## 2023-10-23 NOTE — Telephone Encounter (Signed)
 Done resent to pharmacy

## 2023-10-25 ENCOUNTER — Encounter: Payer: Self-pay | Admitting: Cardiology

## 2023-10-25 ENCOUNTER — Other Ambulatory Visit: Payer: Self-pay | Admitting: Cardiology

## 2023-10-25 ENCOUNTER — Other Ambulatory Visit: Payer: Self-pay

## 2023-10-25 MED ORDER — METHYLPREDNISOLONE 4 MG PO TBPK
ORAL_TABLET | ORAL | 0 refills | Status: AC
  Filled 2023-10-25: qty 21, 6d supply, fill #0

## 2023-10-26 ENCOUNTER — Ambulatory Visit: Admitting: Cardiology

## 2023-10-26 ENCOUNTER — Ambulatory Visit

## 2023-10-29 ENCOUNTER — Telehealth: Payer: Self-pay | Admitting: Family

## 2023-10-29 ENCOUNTER — Other Ambulatory Visit: Payer: Self-pay

## 2023-10-29 DIAGNOSIS — I83813 Varicose veins of bilateral lower extremities with pain: Secondary | ICD-10-CM

## 2023-10-29 MED ORDER — HYDROCOD POLI-CHLORPHE POLI ER 10-8 MG/5ML PO SUER
5.0000 mL | Freq: Two times a day (BID) | ORAL | 0 refills | Status: DC | PRN
Start: 2023-10-29 — End: 2024-02-12
  Filled 2023-10-29: qty 115, 12d supply, fill #0

## 2023-10-29 MED ORDER — PROPRANOLOL HCL 20 MG PO TABS
20.0000 mg | ORAL_TABLET | Freq: Two times a day (BID) | ORAL | 11 refills | Status: AC
  Filled 2023-10-29: qty 60, 30d supply, fill #0
  Filled 2023-12-04: qty 60, 30d supply, fill #1
  Filled 2024-01-03: qty 60, 30d supply, fill #2
  Filled 2024-02-06: qty 60, 30d supply, fill #3
  Filled 2024-03-03: qty 60, 30d supply, fill #4
  Filled 2024-04-18: qty 60, 30d supply, fill #5
  Filled 2024-05-29: qty 60, 30d supply, fill #6

## 2023-10-29 NOTE — Telephone Encounter (Signed)
 New dose of Propranolol  sent in per [facility] verbal, rx sent was 20mg  and V/V referral pended to pcp

## 2023-10-29 NOTE — Telephone Encounter (Signed)
 Patient left VM requesting codeine cough syrup. States that the cough she's been having is not going away and she has "tried everything". Please advise on what she should do.

## 2023-10-29 NOTE — Telephone Encounter (Signed)
 This request has already been sent to [facility], she just has to fill the rx for the patient

## 2023-10-30 ENCOUNTER — Other Ambulatory Visit: Payer: Self-pay

## 2023-10-30 ENCOUNTER — Encounter (INDEPENDENT_AMBULATORY_CARE_PROVIDER_SITE_OTHER): Payer: Self-pay

## 2023-10-30 MED ORDER — AZITHROMYCIN 250 MG PO TABS
250.0000 mg | ORAL_TABLET | Freq: Every day | ORAL | 0 refills | Status: DC
  Filled 2023-10-30: qty 6, 6d supply, fill #0

## 2023-10-31 ENCOUNTER — Other Ambulatory Visit: Payer: Self-pay

## 2023-11-04 NOTE — Assessment & Plan Note (Signed)
 B12 injection given in office today.  Return for next injection per provider instructions.

## 2023-11-05 ENCOUNTER — Other Ambulatory Visit: Payer: Self-pay

## 2023-11-05 DIAGNOSIS — R251 Tremor, unspecified: Secondary | ICD-10-CM

## 2023-11-05 MED ORDER — TOPIRAMATE 50 MG PO TABS
50.0000 mg | ORAL_TABLET | Freq: Two times a day (BID) | ORAL | 1 refills | Status: DC
Start: 2023-11-05 — End: 2023-11-09
  Filled 2023-11-05 – 2023-11-07 (×2): qty 60, 30d supply, fill #0

## 2023-11-06 ENCOUNTER — Other Ambulatory Visit: Payer: Self-pay

## 2023-11-07 ENCOUNTER — Other Ambulatory Visit: Payer: Self-pay

## 2023-11-09 ENCOUNTER — Other Ambulatory Visit: Payer: Self-pay

## 2023-11-09 ENCOUNTER — Encounter: Payer: Self-pay | Admitting: Family

## 2023-11-09 ENCOUNTER — Ambulatory Visit: Admitting: Family

## 2023-11-09 VITALS — BP 122/78 | HR 54 | Ht 63.0 in | Wt 114.4 lb

## 2023-11-09 DIAGNOSIS — J449 Chronic obstructive pulmonary disease, unspecified: Secondary | ICD-10-CM

## 2023-11-09 DIAGNOSIS — R300 Dysuria: Secondary | ICD-10-CM

## 2023-11-09 DIAGNOSIS — R052 Subacute cough: Secondary | ICD-10-CM | POA: Diagnosis not present

## 2023-11-09 DIAGNOSIS — Z0130 Encounter for examination of blood pressure without abnormal findings: Secondary | ICD-10-CM

## 2023-11-09 DIAGNOSIS — E538 Deficiency of other specified B group vitamins: Secondary | ICD-10-CM

## 2023-11-09 DIAGNOSIS — J22 Unspecified acute lower respiratory infection: Secondary | ICD-10-CM | POA: Diagnosis not present

## 2023-11-09 LAB — POCT URINALYSIS DIPSTICK
Bilirubin, UA: NEGATIVE
Blood, UA: NEGATIVE
Glucose, UA: NEGATIVE
Ketones, UA: NEGATIVE
Leukocytes, UA: NEGATIVE
Nitrite, UA: NEGATIVE
Protein, UA: NEGATIVE
Spec Grav, UA: 1.015 (ref 1.010–1.025)
Urobilinogen, UA: 0.2 U/dL
pH, UA: 6.5 (ref 5.0–8.0)

## 2023-11-09 MED ORDER — BENZONATATE 100 MG PO CAPS
100.0000 mg | ORAL_CAPSULE | Freq: Three times a day (TID) | ORAL | 1 refills | Status: DC | PRN
  Filled 2023-11-09: qty 90, 30d supply, fill #0

## 2023-11-09 MED ORDER — AMOXICILLIN 500 MG PO CAPS
500.0000 mg | ORAL_CAPSULE | Freq: Two times a day (BID) | ORAL | 0 refills | Status: DC
  Filled 2023-11-09: qty 14, 7d supply, fill #0

## 2023-11-09 MED ORDER — TOPIRAMATE 100 MG PO TABS
100.0000 mg | ORAL_TABLET | Freq: Two times a day (BID) | ORAL | 3 refills | Status: DC
  Filled 2023-11-09: qty 60, 30d supply, fill #0
  Filled 2023-12-04: qty 60, 30d supply, fill #1
  Filled 2024-01-03: qty 60, 30d supply, fill #2
  Filled 2024-02-06: qty 60, 30d supply, fill #3

## 2023-11-09 MED ORDER — CYANOCOBALAMIN 1000 MCG/ML IJ SOLN
1000.0000 ug | Freq: Once | INTRAMUSCULAR | Status: AC
  Administered 2023-11-09: 1000 ug via INTRAMUSCULAR

## 2023-11-09 MED ORDER — PREDNISONE 20 MG PO TABS
40.0000 mg | ORAL_TABLET | Freq: Every day | ORAL | 0 refills | Status: DC
  Filled 2023-11-09: qty 14, 7d supply, fill #0

## 2023-11-09 MED ORDER — BREZTRI AEROSPHERE 160-9-4.8 MCG/ACT IN AERO
2.0000 | INHALATION_SPRAY | Freq: Two times a day (BID) | RESPIRATORY_TRACT | 3 refills | Status: DC
  Filled 2023-11-09: qty 10.7, 30d supply, fill #0

## 2023-11-09 NOTE — Assessment & Plan Note (Signed)
 B12 injection given in office today.  Return for next injection per provider instructions.
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
## 2023-11-09 NOTE — Assessment & Plan Note (Signed)
 Sending referral to pulmonology.  Also switching meds to Breztri  from Advair , since this does not seem to be controlling her symptoms anymore.

## 2023-11-09 NOTE — Patient Instructions (Signed)
 Natasha Hanson

## 2023-11-12 ENCOUNTER — Other Ambulatory Visit: Payer: Self-pay | Admitting: Family

## 2023-11-12 ENCOUNTER — Other Ambulatory Visit: Payer: Self-pay

## 2023-11-13 ENCOUNTER — Other Ambulatory Visit: Payer: Self-pay

## 2023-11-13 ENCOUNTER — Telehealth: Payer: Self-pay | Admitting: Cardiology

## 2023-11-13 MED FILL — Gabapentin Tab 600 MG: ORAL | 30 days supply | Qty: 90 | Fill #0 | Status: CN

## 2023-11-13 NOTE — Telephone Encounter (Signed)
 Pt called stating that she wanted to make sure that we had put the correct dates on her work note. She stated the dates she was out was 5/7-5/23  Please advise

## 2023-11-22 ENCOUNTER — Ambulatory Visit: Admitting: Podiatry

## 2023-11-23 ENCOUNTER — Ambulatory Visit

## 2023-11-26 ENCOUNTER — Other Ambulatory Visit: Payer: Self-pay

## 2023-11-26 ENCOUNTER — Ambulatory Visit

## 2023-11-28 ENCOUNTER — Ambulatory Visit

## 2023-12-04 ENCOUNTER — Other Ambulatory Visit: Payer: Self-pay | Admitting: Family

## 2023-12-04 ENCOUNTER — Other Ambulatory Visit: Payer: Self-pay

## 2023-12-04 MED ORDER — TIZANIDINE HCL 4 MG PO TABS
4.0000 mg | ORAL_TABLET | Freq: Four times a day (QID) | ORAL | 3 refills | Status: DC | PRN
Start: 2023-12-04 — End: 2024-04-18
  Filled 2023-12-04: qty 120, 30d supply, fill #0
  Filled 2024-01-03: qty 120, 30d supply, fill #1
  Filled 2024-02-06: qty 120, 30d supply, fill #2
  Filled 2024-03-03: qty 120, 30d supply, fill #3

## 2023-12-04 MED FILL — Gabapentin Tab 600 MG: ORAL | 30 days supply | Qty: 90 | Fill #0 | Status: AC

## 2023-12-05 ENCOUNTER — Encounter: Payer: Self-pay | Admitting: Cardiology

## 2023-12-05 ENCOUNTER — Ambulatory Visit (INDEPENDENT_AMBULATORY_CARE_PROVIDER_SITE_OTHER): Admitting: Cardiology

## 2023-12-05 ENCOUNTER — Other Ambulatory Visit: Payer: Self-pay

## 2023-12-05 VITALS — BP 113/77 | HR 57 | Ht 63.0 in | Wt 114.8 lb

## 2023-12-05 DIAGNOSIS — Z0130 Encounter for examination of blood pressure without abnormal findings: Secondary | ICD-10-CM

## 2023-12-05 DIAGNOSIS — R600 Localized edema: Secondary | ICD-10-CM | POA: Diagnosis not present

## 2023-12-05 MED ORDER — FUROSEMIDE 20 MG PO TABS
20.0000 mg | ORAL_TABLET | Freq: Every day | ORAL | 0 refills | Status: AC
Start: 2023-12-05 — End: 2024-01-16
  Filled 2023-12-05: qty 30, 30d supply, fill #0

## 2023-12-05 NOTE — Assessment & Plan Note (Signed)
 On exam today, non pitting edema noted. Will send in furosemide 20 mg daily for 3 days. Recommend compression hose. BMP today to check kidney function.
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
## 2023-12-06 ENCOUNTER — Ambulatory Visit: Admitting: Cardiology

## 2023-12-11 LAB — BASIC METABOLIC PANEL WITH GFR
BUN/Creatinine Ratio: 9 (ref 9–23)
BUN: 8 mg/dL (ref 6–24)
Calcium: 8.9 mg/dL (ref 8.7–10.2)
Chloride: 107 mmol/L — ABNORMAL HIGH (ref 96–106)
Creatinine, Ser: 0.85 mg/dL (ref 0.57–1.00)
Glucose: 97 mg/dL (ref 70–99)
Potassium: 4.2 mmol/L (ref 3.5–5.2)
Sodium: 141 mmol/L (ref 134–144)
eGFR: 80 mL/min/{1.73_m2} (ref >59)

## 2023-12-12 ENCOUNTER — Ambulatory Visit: Payer: Self-pay | Admitting: Cardiology

## 2023-12-18 ENCOUNTER — Other Ambulatory Visit: Payer: Self-pay

## 2023-12-19 ENCOUNTER — Ambulatory Visit (INDEPENDENT_AMBULATORY_CARE_PROVIDER_SITE_OTHER)

## 2023-12-19 ENCOUNTER — Encounter (INDEPENDENT_AMBULATORY_CARE_PROVIDER_SITE_OTHER): Payer: Self-pay | Admitting: Vascular Surgery

## 2023-12-19 ENCOUNTER — Ambulatory Visit (INDEPENDENT_AMBULATORY_CARE_PROVIDER_SITE_OTHER): Admitting: Vascular Surgery

## 2023-12-19 VITALS — BP 102/68 | HR 67 | Ht 63.0 in | Wt 112.0 lb

## 2023-12-19 DIAGNOSIS — I890 Lymphedema, not elsewhere classified: Secondary | ICD-10-CM | POA: Diagnosis not present

## 2023-12-19 DIAGNOSIS — I83813 Varicose veins of bilateral lower extremities with pain: Secondary | ICD-10-CM | POA: Diagnosis not present

## 2023-12-19 DIAGNOSIS — E782 Mixed hyperlipidemia: Secondary | ICD-10-CM

## 2023-12-19 DIAGNOSIS — F17210 Nicotine dependence, cigarettes, uncomplicated: Secondary | ICD-10-CM
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
## 2023-12-21 ENCOUNTER — Other Ambulatory Visit: Payer: Self-pay

## 2023-12-24 ENCOUNTER — Other Ambulatory Visit: Payer: Self-pay

## 2023-12-24 ENCOUNTER — Other Ambulatory Visit: Payer: Self-pay | Admitting: Family

## 2023-12-25 ENCOUNTER — Other Ambulatory Visit: Payer: Self-pay

## 2023-12-26 ENCOUNTER — Other Ambulatory Visit: Payer: Self-pay

## 2023-12-26 MED FILL — Albuterol Sulfate Inhal Aero 108 MCG/ACT (90MCG Base Equiv): RESPIRATORY_TRACT | 16 days supply | Qty: 18 | Fill #0 | Status: AC

## 2023-12-27 ENCOUNTER — Other Ambulatory Visit: Payer: Self-pay

## 2024-01-03 ENCOUNTER — Other Ambulatory Visit: Payer: Self-pay

## 2024-01-03 ENCOUNTER — Ambulatory Visit: Admitting: Family

## 2024-01-03 ENCOUNTER — Encounter: Payer: Self-pay | Admitting: Family

## 2024-01-03 VITALS — BP 124/72 | HR 61 | Ht 63.0 in | Wt 111.8 lb

## 2024-01-03 DIAGNOSIS — S76812A Strain of other specified muscles, fascia and tendons at thigh level, left thigh, initial encounter: Secondary | ICD-10-CM

## 2024-01-03 DIAGNOSIS — E538 Deficiency of other specified B group vitamins: Secondary | ICD-10-CM

## 2024-01-03 DIAGNOSIS — R300 Dysuria: Secondary | ICD-10-CM

## 2024-01-03 LAB — POCT URINALYSIS DIPSTICK
Bilirubin, UA: NEGATIVE
Blood, UA: NEGATIVE
Glucose, UA: NEGATIVE
Ketones, UA: NEGATIVE
Leukocytes, UA: NEGATIVE
Nitrite, UA: NEGATIVE
Protein, UA: NEGATIVE
Spec Grav, UA: 1.015 (ref 1.010–1.025)
Urobilinogen, UA: 0.2 U/dL
pH, UA: 7 (ref 5.0–8.0)

## 2024-01-03 MED ORDER — CYANOCOBALAMIN 1000 MCG/ML IJ SOLN
1000.0000 ug | Freq: Once | INTRAMUSCULAR | Status: AC
  Administered 2024-01-03: 1000 ug via INTRAMUSCULAR

## 2024-01-03 MED ORDER — MELOXICAM 7.5 MG PO TABS
7.5000 mg | ORAL_TABLET | Freq: Two times a day (BID) | ORAL | 0 refills | Status: DC
  Filled 2024-01-03: qty 17, 8d supply, fill #0
  Filled 2024-01-03: qty 43, 22d supply, fill #0

## 2024-01-03 MED ORDER — AEROCHAMBER PLS FLOVU MTHPIECE DEVI
1.0000 | 0 refills | Status: AC | PRN
  Filled 2024-01-03: qty 1, 1d supply, fill #0

## 2024-01-03 MED FILL — Gabapentin Tab 600 MG: ORAL | 30 days supply | Qty: 90 | Fill #1 | Status: AC

## 2024-01-09 ENCOUNTER — Ambulatory Visit: Admitting: Family

## 2024-01-10 ENCOUNTER — Encounter: Payer: Self-pay | Admitting: Family

## 2024-01-10 NOTE — Progress Notes (Signed)
 Acute Office Visit  Subjective:     Patient ID: Natasha Hanson, female    DOB: 12/30/1965, 58 y.o.   MRN: [id number]  Patient is in today for  Chief Complaint  Patient presents with   Acute Visit    Tips of fingers going numb on both hands    Patient is here today for acute visit for her hands going numb.  She says that the tips of her fingers have been going numb on and off, having this occur randomly.  Has a spot on her nose that she would like to see Dermatology about.  Also needs referral to ENT, as she has been dealing with frequent ear pain and pressure, as well as sinus pressure.        Review of Systems  All other systems reviewed and are negative.       Objective:    BP 102/62   Pulse (!) 59   Ht 5' 3 (1.6 m)   Wt 117 lb 3.2 oz (53.2 kg)   SpO2 98%   BMI 20.76 kg/m   Physical Exam Vitals and nursing note reviewed.  Constitutional:      Appearance: Normal appearance. She is normal weight.  HENT:     Head: Normocephalic.  Eyes:     Extraocular Movements: Extraocular movements intact.     Conjunctiva/sclera: Conjunctivae normal.     Pupils: Pupils are equal, round, and reactive to light.  Cardiovascular:     Rate and Rhythm: Normal rate.  Pulmonary:     Effort: Pulmonary effort is normal.  Neurological:     General: No focal deficit present.     Mental Status: She is alert and oriented to person, place, and time. Mental status is at baseline.  Psychiatric:        Mood and Affect: Mood normal.        Behavior: Behavior normal.        Thought Content: Thought content normal.        Judgment: Judgment normal.     Results for orders placed or performed in visit on 10/12/23  Lipid panel  Result Value Ref Range   Cholesterol, Total 137 100 - 199 mg/dL   Triglycerides 56 0 - 149 mg/dL   HDL 49 >60 mg/dL   VLDL Cholesterol Cal 12 5 - 40 mg/dL   LDL Chol Calc (NIH) 76 0 - 99 mg/dL   Chol/HDL Ratio 2.8 0.0 - 4.4 ratio  VITAMIN D  25 Hydroxy  (Vit-D Deficiency, Fractures)  Result Value Ref Range   Vit D, 25-Hydroxy 20.1 (L) 30.0 - 100.0 ng/mL  CMP14+EGFR  Result Value Ref Range   Glucose 90 70 - 99 mg/dL   BUN 8 6 - 24 mg/dL   Creatinine, Ser 8.79 (H) 0.57 - 1.00 mg/dL   eGFR 53 (L) >40 fO/fpw/8.26   BUN/Creatinine Ratio 7 (L) 9 - 23   Sodium 141 134 - 144 mmol/L   Potassium 4.3 3.5 - 5.2 mmol/L   Chloride 103 96 - 106 mmol/L   CO2 20 20 - 29 mmol/L   Calcium 9.7 8.7 - 10.2 mg/dL   Total Protein 7.2 6.0 - 8.5 g/dL   Albumin 4.1 3.8 - 4.9 g/dL   Globulin, Total 3.1 1.5 - 4.5 g/dL   Bilirubin Total 0.2 0.0 - 1.2 mg/dL   Alkaline Phosphatase 78 44 - 121 IU/L   AST 15 0 - 40 IU/L   ALT 12 0 - 32 IU/L  Hemoglobin  A1c  Result Value Ref Range   Hgb A1c MFr Bld 5.2 4.8 - 5.6 %   Est. average glucose Bld gHb Est-mCnc 103 mg/dL  Vitamin A87  Result Value Ref Range   Vitamin B-12 336 232 - 1,245 pg/mL  CBC with Diff  Result Value Ref Range   WBC 5.1 3.4 - 10.8 x10E3/uL   RBC 4.42 3.77 - 5.28 x10E6/uL   Hemoglobin 14.8 11.1 - 15.9 g/dL   Hematocrit 57.8 65.9 - 46.6 %   MCV 95 79 - 97 fL   MCH 33.5 (H) 26.6 - 33.0 pg   MCHC 35.2 31.5 - 35.7 g/dL   RDW 86.8 88.2 - 84.5 %   Platelets 305 150 - 450 x10E3/uL   Neutrophils 57 Not Estab. %   Lymphs 28 Not Estab. %   Monocytes 10 Not Estab. %   Eos 4 Not Estab. %   Basos 1 Not Estab. %   Neutrophils Absolute 2.9 1.4 - 7.0 x10E3/uL   Lymphocytes Absolute 1.4 0.7 - 3.1 x10E3/uL   Monocytes Absolute 0.5 0.1 - 0.9 x10E3/uL   EOS (ABSOLUTE) 0.2 0.0 - 0.4 x10E3/uL   Basophils Absolute 0.0 0.0 - 0.2 x10E3/uL   Immature Granulocytes 0 Not Estab. %   Immature Grans (Abs) 0.0 0.0 - 0.1 x10E3/uL  Iron, TIBC and Ferritin Panel  Result Value Ref Range   Total Iron Binding Capacity 303 250 - 450 ug/dL   UIBC 756 868 - 574 ug/dL   Iron 60 27 - 840 ug/dL   Iron Saturation 20 15 - 55 %   Ferritin 51 15 - 150 ng/mL  TSH+T4F+T3Free  Result Value Ref Range   TSH 0.555 0.450 - 4.500  uIU/mL   T3, Free 3.2 2.0 - 4.4 pg/mL   Free T4 0.99 0.82 - 1.77 ng/dL    Recent Results (from the past 2160 hours)  POCT Urinalysis Dipstick (18997)     Status: None   Collection Time: 11/09/23  4:18 PM  Result Value Ref Range   Color, UA Yellow    Clarity, UA Cloudy    Glucose, UA Negative Negative   Bilirubin, UA Negative    Ketones, UA Negative    Spec Grav, UA 1.015 1.010 - 1.025   Blood, UA Negative    pH, UA 6.5 5.0 - 8.0   Protein, UA Negative Negative   Urobilinogen, UA 0.2 0.2 or 1.0 E.U./dL   Nitrite, UA Negative    Leukocytes, UA Negative Negative   Appearance Cloudy    Odor Yes   Basic metabolic panel with GFR     Status: Abnormal   Collection Time: 12/05/23 11:26 AM  Result Value Ref Range   Glucose 97 70 - 99 mg/dL   BUN 8 6 - 24 mg/dL   Creatinine, Ser 9.14 0.57 - 1.00 mg/dL   eGFR 80 >40 fO/fpw/8.26   BUN/Creatinine Ratio 9 9 - 23   Sodium 141 134 - 144 mmol/L   Potassium 4.2 3.5 - 5.2 mmol/L   Chloride 107 (H) 96 - 106 mmol/L   CO2 CANCELED mmol/L    Comment: Test not performed. Due to technical problems in testing, a valid result could not be obtained. The remaining specimen was not sufficient for additional testing.  Result canceled by the ancillary.    Calcium 8.9 8.7 - 10.2 mg/dL  POCT Urinalysis Dipstick (18997)     Status: None   Collection Time: 01/03/24 10:16 AM  Result Value Ref Range   Color,  UA Yellow    Clarity, UA Clear    Glucose, UA Negative Negative   Bilirubin, UA Negative    Ketones, UA Negative    Spec Grav, UA 1.015 1.010 - 1.025   Blood, UA Negative    pH, UA 7.0 5.0 - 8.0   Protein, UA Negative Negative   Urobilinogen, UA 0.2 0.2 or 1.0 E.U./dL   Nitrite, UA Negative    Leukocytes, UA Negative Negative   Appearance Clear    Odor Yes     Allergies as of 10/12/2023       Reactions   No Known Allergies         Medication List        Accurate as of Oct 12, 2023 11:59 PM. If you have any questions, ask your nurse  or doctor.          Advair  Diskus 100-50 MCG/ACT Aepb Generic drug: fluticasone -salmeterol Inhale 1 puff into the lungs 2 (two) times daily.   albuterol  1.25 MG/3ML nebulizer solution Commonly known as: ACCUNEB  Take 3 mLs by nebulization 4 (four) times daily for shortness of breath   Ventolin  HFA 108 (90 Base) MCG/ACT inhaler Generic drug: albuterol  Inhale 2-4 puffs into the lungs every 4 (four) hours as needed.   amoxicillin -clavulanate 875-125 MG tablet Commonly known as: AUGMENTIN  Take 1 tablet by mouth 2 (two) times daily.   buPROPion  150 MG 24 hr tablet Commonly known as: WELLBUTRIN  XL Take 1 tablet (150 mg total) by mouth daily.   [employer] Use with inhaler as directed   cyanocobalamin  1000 MCG/ML injection Commonly known as: VITAMIN B12 Inject 1 mL (1,000 mcg total) into the muscle every 14 (fourteen) days.   famotidine  20 MG tablet Commonly known as: PEPCID  Take 1 tablet (20 mg total) by mouth daily.   fexofenadine  180 MG tablet Commonly known as: ALLEGRA  Take 1 tablet (180 mg total) by mouth daily.   fluticasone  50 MCG/ACT nasal spray Commonly known as: FLONASE  Place 2 sprays into both nostrils daily.   gabapentin  600 MG tablet Commonly known as: NEURONTIN  Take 1 tablet (600 mg total) by mouth 3 (three) times daily.   ibuprofen  800 MG tablet Commonly known as: ADVIL  Take 1 tablet (800 mg total) by mouth every 8 (eight) hours as needed.   Nurtec 75 MG Tbdp Generic drug: Rimegepant Sulfate  Take 1 tablet (75 mg total) by mouth as needed.   propranolol  10 MG tablet Commonly known as: INDERAL  Take 1 tablet (10 mg total) by mouth 2 (two) times daily.   tiZANidine  4 MG tablet Commonly known as: ZANAFLEX  Take 1 tablet (4 mg total) by mouth every 6 (six) hours as needed for muscle spasms.   topiramate  50 MG tablet Commonly known as: TOPAMAX  Take 1 tablet (50 mg total) by mouth 2 (two) times daily.   traZODone  50 MG tablet Commonly  known as: DESYREL  Take 1 tablet (50 mg total) by mouth at bedtime as needed for sleep.   valACYclovir  500 MG tablet Commonly known as: VALTREX  Take 1 tablet (500 mg total) by mouth 2 (two) times daily.   Zenpep  10000-32000 units Cpep Generic drug: Pancrelipase  (Lip-Prot-Amyl) Take 2 capsules by mouth 3 (three) times daily with meals AND 1 capsule with snacks.            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 Will defer to them for further treatment changes.  Reassess at follow  up.  Recurrent sinusitis Setting patient up for referral to ENT .  Will defer to them for further treatment changes.  Reassess at follow up.  Mixed hyperlipidemia Checking labs today.  Continue current therapy for lipid control. Will modify as needed based on labwork results.   -CMP w/eGFR -Lipid Panel  Prediabetes A1C Continues to be in prediabetic ranges.  Will reassess at follow up after next lab check.  Patient counseled on dietary choices and verbalized understanding.   -CBC w/Diff -CMP w/eGFR -Hemoglobin A1C  Vitamin D  deficiency, unspecified Pernicious anemia Other fatigue Checking labs today.  Will continue supplements as needed.   - Vitamin D  - Vitamin B12 - TSH    No follow-ups on file.  Total time spent: 30 minutes  ALAN CHRISTELLA ARRANT, FNP  10/12/2023   This document may have been prepared by [facility] Voice Recognition software and as such may include unintentional dictation errors.

## 2024-01-10 NOTE — Assessment & Plan Note (Signed)
 Checking labs today.  Will continue supplements as needed.   - Vitamin D  - Vitamin B12 - TSH

## 2024-01-10 NOTE — Assessment & Plan Note (Signed)
 A1C Continues to be in prediabetic ranges.  Will reassess at follow up after next lab check.  Patient counseled on dietary choices and verbalized understanding.   -CBC w/Diff -CMP w/eGFR -Hemoglobin A1C

## 2024-01-10 NOTE — Assessment & Plan Note (Signed)
 Checking labs today.  Continue current therapy for lipid control. Will modify as needed based on labwork results.   -CMP w/eGFR -Lipid Panel

## 2024-01-16 ENCOUNTER — Ambulatory Visit (INDEPENDENT_AMBULATORY_CARE_PROVIDER_SITE_OTHER): Admitting: Family

## 2024-01-16 ENCOUNTER — Encounter: Payer: Self-pay | Admitting: Family

## 2024-01-16 ENCOUNTER — Other Ambulatory Visit: Payer: Self-pay

## 2024-01-16 DIAGNOSIS — J329 Chronic sinusitis, unspecified: Secondary | ICD-10-CM | POA: Diagnosis not present

## 2024-01-16 DIAGNOSIS — E559 Vitamin D deficiency, unspecified: Secondary | ICD-10-CM | POA: Diagnosis not present

## 2024-01-16 DIAGNOSIS — D510 Vitamin B12 deficiency anemia due to intrinsic factor deficiency: Secondary | ICD-10-CM

## 2024-01-16 DIAGNOSIS — Z0130 Encounter for examination of blood pressure without abnormal findings: Secondary | ICD-10-CM

## 2024-01-16 DIAGNOSIS — E782 Mixed hyperlipidemia: Secondary | ICD-10-CM

## 2024-01-16 DIAGNOSIS — R5383 Other fatigue: Secondary | ICD-10-CM

## 2024-01-16 DIAGNOSIS — R7303 Prediabetes: Secondary | ICD-10-CM

## 2024-01-16 MED ORDER — BUSPIRONE HCL 5 MG PO TABS
5.0000 mg | ORAL_TABLET | Freq: Three times a day (TID) | ORAL | 1 refills | Status: AC
  Filled 2024-01-16: qty 90, 30d supply, fill #0

## 2024-01-16 NOTE — Assessment & Plan Note (Signed)
 A1C Continues to be in prediabetic ranges.  Will reassess at follow up after next lab check.  Patient counseled on dietary choices and verbalized understanding.   -CBC w/Diff -CMP w/eGFR -Hemoglobin A1C
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
## 2024-01-16 NOTE — Assessment & Plan Note (Signed)
 Checking labs today.  Will continue supplements as needed.   - Vitamin D  - Vitamin B12 - TSH

## 2024-01-16 NOTE — Assessment & Plan Note (Signed)
 Checking labs today.  Continue current therapy for lipid control. Will modify as needed based on labwork results.   -CMP w/eGFR -Lipid Panel

## 2024-01-18 ENCOUNTER — Telehealth: Payer: Self-pay

## 2024-01-18 ENCOUNTER — Other Ambulatory Visit: Payer: Self-pay

## 2024-01-18 MED ORDER — AZITHROMYCIN 250 MG PO TABS
ORAL_TABLET | ORAL | 0 refills | Status: AC
  Filled 2024-01-18: qty 6, 5d supply, fill #0

## 2024-01-18 NOTE — Addendum Note (Signed)
 Addended by: ORLEAN PALMA on: 01/18/2024 10:14 AM   Modules accepted: Orders

## 2024-01-18 NOTE — Telephone Encounter (Signed)
 Called patient to let her know Alan has called her in an antibiotic.

## 2024-01-24 ENCOUNTER — Other Ambulatory Visit: Payer: Self-pay

## 2024-01-24 ENCOUNTER — Other Ambulatory Visit: Payer: Self-pay | Admitting: Family

## 2024-01-25 ENCOUNTER — Other Ambulatory Visit: Payer: Self-pay

## 2024-01-25 MED FILL — Albuterol Sulfate Inhal Aero 108 MCG/ACT (90MCG Base Equiv): RESPIRATORY_TRACT | 16 days supply | Qty: 18 | Fill #0 | Status: CN

## 2024-01-28 ENCOUNTER — Other Ambulatory Visit: Payer: Self-pay

## 2024-02-05 ENCOUNTER — Other Ambulatory Visit: Payer: Self-pay

## 2024-02-05 MED FILL — Albuterol Sulfate Inhal Aero 108 MCG/ACT (90MCG Base Equiv): RESPIRATORY_TRACT | 16 days supply | Qty: 18 | Fill #0 | Status: AC

## 2024-02-06 ENCOUNTER — Other Ambulatory Visit: Payer: Self-pay

## 2024-02-06 ENCOUNTER — Other Ambulatory Visit: Payer: Self-pay | Admitting: Family

## 2024-02-07 ENCOUNTER — Other Ambulatory Visit: Payer: Self-pay

## 2024-02-07 MED ORDER — GABAPENTIN 600 MG PO TABS
600.0000 mg | ORAL_TABLET | Freq: Three times a day (TID) | ORAL | 1 refills | Status: DC
  Filled 2024-02-07: qty 90, 30d supply, fill #0
  Filled 2024-03-07 – 2024-03-27 (×2): qty 90, 30d supply, fill #1

## 2024-02-07 MED ORDER — IBUPROFEN 800 MG PO TABS
800.0000 mg | ORAL_TABLET | Freq: Three times a day (TID) | ORAL | 1 refills | Status: DC | PRN
  Filled 2024-02-07: qty 90, 30d supply, fill #0
  Filled 2024-03-27: qty 90, 30d supply, fill #1

## 2024-02-07 NOTE — Telephone Encounter (Signed)
 Patient called about her medication refills, tried to call her back but no answer

## 2024-02-12 ENCOUNTER — Ambulatory Visit (INDEPENDENT_AMBULATORY_CARE_PROVIDER_SITE_OTHER): Admitting: Cardiology

## 2024-02-12 ENCOUNTER — Encounter: Payer: Self-pay | Admitting: Cardiology

## 2024-02-12 ENCOUNTER — Other Ambulatory Visit: Payer: Self-pay

## 2024-02-12 VITALS — BP 124/80 | HR 53 | Ht 63.0 in | Wt 109.0 lb

## 2024-02-12 DIAGNOSIS — J069 Acute upper respiratory infection, unspecified: Secondary | ICD-10-CM

## 2024-02-12 DIAGNOSIS — E538 Deficiency of other specified B group vitamins: Secondary | ICD-10-CM

## 2024-02-12 DIAGNOSIS — Z0130 Encounter for examination of blood pressure without abnormal findings: Secondary | ICD-10-CM

## 2024-02-12 DIAGNOSIS — J0140 Acute pansinusitis, unspecified: Secondary | ICD-10-CM | POA: Diagnosis not present

## 2024-02-12 MED ORDER — METHYLPREDNISOLONE 4 MG PO TBPK
ORAL_TABLET | ORAL | 0 refills | Status: DC
Start: 2024-02-12 — End: 2024-03-27
  Filled 2024-02-12: qty 21, 6d supply, fill #0

## 2024-02-12 MED ORDER — CYANOCOBALAMIN 1000 MCG/ML IJ SOLN
1000.0000 ug | Freq: Once | INTRAMUSCULAR | Status: AC
  Administered 2024-02-12: 1000 ug via INTRAMUSCULAR

## 2024-02-12 MED ORDER — BENZONATATE 100 MG PO CAPS
100.0000 mg | ORAL_CAPSULE | Freq: Three times a day (TID) | ORAL | 1 refills | Status: DC | PRN
  Filled 2024-02-12: qty 90, 30d supply, fill #0

## 2024-02-12 MED ORDER — AMOXICILLIN-POT CLAVULANATE 875-125 MG PO TABS
1.0000 | ORAL_TABLET | Freq: Two times a day (BID) | ORAL | 0 refills | Status: AC
Start: 2024-02-12 — End: 2024-02-19
  Filled 2024-02-12: qty 14, 7d supply, fill #0
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
## 2024-02-13 DIAGNOSIS — J0141 Acute recurrent pansinusitis: Secondary | ICD-10-CM | POA: Insufficient documentation

## 2024-02-13 DIAGNOSIS — J0140 Acute pansinusitis, unspecified: Secondary | ICD-10-CM | POA: Insufficient documentation

## 2024-02-13 DIAGNOSIS — J069 Acute upper respiratory infection, unspecified: Secondary | ICD-10-CM | POA: Insufficient documentation

## 2024-02-15 ENCOUNTER — Telehealth: Payer: Self-pay

## 2024-02-15 NOTE — Telephone Encounter (Signed)
 Patient LVM asking if you had filled out her FMLA paperwork, she  said she's been out of work and the start date was 08/29

## 2024-02-18 NOTE — Telephone Encounter (Signed)
 Paper work given to [facility] for completion for when the last time the patient was in office

## 2024-03-03 ENCOUNTER — Other Ambulatory Visit: Payer: Self-pay | Admitting: Family

## 2024-03-03 ENCOUNTER — Other Ambulatory Visit: Payer: Self-pay

## 2024-03-03 MED FILL — Fluticasone-Salmeterol Aer Powder BA 100-50 MCG/ACT: RESPIRATORY_TRACT | 30 days supply | Qty: 60 | Fill #0 | Status: AC

## 2024-03-03 MED FILL — Topiramate Tab 100 MG: ORAL | 30 days supply | Qty: 60 | Fill #0 | Status: AC

## 2024-03-04 ENCOUNTER — Other Ambulatory Visit: Payer: Self-pay | Admitting: Family

## 2024-03-04 ENCOUNTER — Other Ambulatory Visit: Payer: Self-pay

## 2024-03-06 ENCOUNTER — Other Ambulatory Visit: Payer: Self-pay

## 2024-03-18 ENCOUNTER — Other Ambulatory Visit: Payer: Self-pay

## 2024-03-27 ENCOUNTER — Encounter: Payer: Self-pay | Admitting: Cardiology

## 2024-03-27 ENCOUNTER — Ambulatory Visit (INDEPENDENT_AMBULATORY_CARE_PROVIDER_SITE_OTHER): Admitting: Cardiology

## 2024-03-27 ENCOUNTER — Other Ambulatory Visit: Payer: Self-pay

## 2024-03-27 VITALS — BP 122/78 | HR 59 | Ht 63.0 in | Wt 108.0 lb

## 2024-03-27 DIAGNOSIS — J069 Acute upper respiratory infection, unspecified: Secondary | ICD-10-CM

## 2024-03-27 MED ORDER — BENZONATATE 100 MG PO CAPS
100.0000 mg | ORAL_CAPSULE | Freq: Three times a day (TID) | ORAL | 1 refills | Status: AC | PRN
  Filled 2024-03-27: qty 90, 30d supply, fill #0

## 2024-03-27 MED ORDER — AMOXICILLIN-POT CLAVULANATE 875-125 MG PO TABS
1.0000 | ORAL_TABLET | Freq: Two times a day (BID) | ORAL | 0 refills | Status: AC
  Filled 2024-03-27: qty 7, 3d supply, fill #0
  Filled 2024-03-27: qty 7, 4d supply, fill #0

## 2024-03-27 MED ORDER — METHYLPREDNISOLONE 4 MG PO TBPK
ORAL_TABLET | ORAL | 0 refills | Status: DC
  Filled 2024-03-27: qty 21, 6d supply, fill #0

## 2024-03-27 MED ORDER — VITAMIN D (ERGOCALCIFEROL) 1.25 MG (50000 UNIT) PO CAPS
50000.0000 [IU] | ORAL_CAPSULE | ORAL | 1 refills | Status: AC
  Filled 2024-03-27: qty 12, 84d supply, fill #0
  Filled 2024-06-20: qty 12, 84d supply, fill #1
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
## 2024-03-28 ENCOUNTER — Other Ambulatory Visit: Payer: Self-pay

## 2024-03-28 ENCOUNTER — Other Ambulatory Visit: Payer: Self-pay | Admitting: Family

## 2024-03-28 MED ORDER — TOPIRAMATE 100 MG PO TABS
100.0000 mg | ORAL_TABLET | Freq: Two times a day (BID) | ORAL | 3 refills | Status: DC
  Filled 2024-03-28 (×2): qty 60, 30d supply, fill #0
  Filled 2024-04-18 – 2024-04-20 (×3): qty 60, 30d supply, fill #1
  Filled 2024-05-07 – 2024-05-13 (×2): qty 60, 30d supply, fill #2

## 2024-04-08 ENCOUNTER — Other Ambulatory Visit: Payer: Self-pay

## 2024-04-08 ENCOUNTER — Ambulatory Visit (INDEPENDENT_AMBULATORY_CARE_PROVIDER_SITE_OTHER): Admitting: Cardiology

## 2024-04-08 ENCOUNTER — Encounter: Payer: Self-pay | Admitting: Cardiology

## 2024-04-08 VITALS — BP 124/84 | HR 53 | Ht 63.0 in | Wt 106.0 lb

## 2024-04-08 DIAGNOSIS — J0141 Acute recurrent pansinusitis: Secondary | ICD-10-CM

## 2024-04-08 DIAGNOSIS — E538 Deficiency of other specified B group vitamins: Secondary | ICD-10-CM | POA: Diagnosis not present

## 2024-04-08 DIAGNOSIS — Z131 Encounter for screening for diabetes mellitus: Secondary | ICD-10-CM

## 2024-04-08 DIAGNOSIS — Z0130 Encounter for examination of blood pressure without abnormal findings: Secondary | ICD-10-CM

## 2024-04-08 MED ORDER — CYANOCOBALAMIN 1000 MCG/ML IJ SOLN
1000.0000 ug | Freq: Once | INTRAMUSCULAR | Status: AC
  Administered 2024-04-08: 1000 ug via INTRAMUSCULAR

## 2024-04-08 MED ORDER — LEVOFLOXACIN 750 MG PO TABS
750.0000 mg | ORAL_TABLET | Freq: Every day | ORAL | 0 refills | Status: AC
  Filled 2024-04-08: qty 7, 7d supply, fill #0
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
## 2024-04-18 ENCOUNTER — Other Ambulatory Visit: Payer: Self-pay | Admitting: Family

## 2024-04-18 ENCOUNTER — Other Ambulatory Visit: Payer: Self-pay

## 2024-04-18 MED FILL — Pancrelipase (Lip-Prot-Amyl) DR Cap 10000-32000-42000 Unit: ORAL | 30 days supply | Qty: 270 | Fill #0 | Status: CN

## 2024-04-18 MED FILL — Albuterol Sulfate Inhal Aero 108 MCG/ACT (90MCG Base Equiv): RESPIRATORY_TRACT | 25 days supply | Qty: 6.7 | Fill #0 | Status: CN

## 2024-04-18 MED FILL — Gabapentin Tab 600 MG: ORAL | 30 days supply | Qty: 90 | Fill #0 | Status: CN

## 2024-04-18 MED FILL — Tizanidine HCl Tab 4 MG (Base Equivalent): ORAL | 30 days supply | Qty: 120 | Fill #0 | Status: CN

## 2024-04-18 MED FILL — Fluticasone-Salmeterol Aer Powder BA 100-50 MCG/ACT: RESPIRATORY_TRACT | 30 days supply | Qty: 60 | Fill #1 | Status: AC

## 2024-04-20 ENCOUNTER — Other Ambulatory Visit: Payer: Self-pay

## 2024-04-21 ENCOUNTER — Other Ambulatory Visit: Payer: Self-pay

## 2024-04-22 ENCOUNTER — Telehealth: Payer: Self-pay

## 2024-04-22 ENCOUNTER — Other Ambulatory Visit: Payer: Self-pay

## 2024-04-22 ENCOUNTER — Other Ambulatory Visit (HOSPITAL_COMMUNITY): Payer: Self-pay

## 2024-04-22 MED ORDER — FLUOROURACIL 5 % EX CREA
1.0000 | TOPICAL_CREAM | Freq: Two times a day (BID) | CUTANEOUS | 0 refills | Status: AC
  Filled 2024-04-22: qty 40, 28d supply, fill #0
  Filled 2024-05-07: qty 40, 30d supply, fill #0

## 2024-04-23 ENCOUNTER — Other Ambulatory Visit: Payer: Self-pay

## 2024-04-23 MED ORDER — ZENPEP 10000-32000 UNITS PO CPEP
ORAL_CAPSULE | ORAL | 0 refills | Status: AC
  Filled 2024-04-23 – 2024-05-07 (×2): qty 300, 30d supply, fill #0

## 2024-04-24 ENCOUNTER — Other Ambulatory Visit: Payer: Self-pay

## 2024-04-24 MED FILL — Gabapentin Tab 600 MG: ORAL | 30 days supply | Qty: 90 | Fill #0 | Status: CN

## 2024-04-25 ENCOUNTER — Ambulatory Visit: Admitting: Family

## 2024-04-25 NOTE — Telephone Encounter (Signed)
 Pharmacy requested update quantity

## 2024-04-30 ENCOUNTER — Other Ambulatory Visit: Payer: Self-pay

## 2024-04-30 MED FILL — Tizanidine HCl Tab 4 MG (Base Equivalent): ORAL | 30 days supply | Qty: 120 | Fill #0 | Status: CN

## 2024-04-30 MED FILL — Albuterol Sulfate Inhal Aero 108 MCG/ACT (90MCG Base Equiv): RESPIRATORY_TRACT | 12 days supply | Qty: 6.7 | Fill #0 | Status: CN

## 2024-05-05 ENCOUNTER — Other Ambulatory Visit: Payer: Self-pay

## 2024-05-07 ENCOUNTER — Other Ambulatory Visit: Payer: Self-pay

## 2024-05-07 MED FILL — Tizanidine HCl Tab 4 MG (Base Equivalent): ORAL | 30 days supply | Qty: 120 | Fill #0 | Status: AC

## 2024-05-07 MED FILL — Gabapentin Tab 600 MG: ORAL | 30 days supply | Qty: 90 | Fill #0 | Status: AC

## 2024-05-13 ENCOUNTER — Encounter: Payer: Self-pay | Admitting: Family

## 2024-05-13 ENCOUNTER — Ambulatory Visit: Admitting: Family

## 2024-05-13 ENCOUNTER — Other Ambulatory Visit: Payer: Self-pay

## 2024-05-13 VITALS — BP 122/78 | HR 58 | Ht 63.0 in | Wt 103.6 lb

## 2024-05-13 DIAGNOSIS — M549 Dorsalgia, unspecified: Secondary | ICD-10-CM

## 2024-05-13 DIAGNOSIS — Z0130 Encounter for examination of blood pressure without abnormal findings: Secondary | ICD-10-CM

## 2024-05-13 DIAGNOSIS — M533 Sacrococcygeal disorders, not elsewhere classified: Secondary | ICD-10-CM | POA: Diagnosis not present

## 2024-05-13 DIAGNOSIS — M51372 Other intervertebral disc degeneration, lumbosacral region with discogenic back pain and lower extremity pain: Secondary | ICD-10-CM

## 2024-05-13 DIAGNOSIS — M4802 Spinal stenosis, cervical region: Secondary | ICD-10-CM

## 2024-05-13 DIAGNOSIS — E538 Deficiency of other specified B group vitamins: Secondary | ICD-10-CM | POA: Diagnosis not present

## 2024-05-13 DIAGNOSIS — H6993 Unspecified Eustachian tube disorder, bilateral: Secondary | ICD-10-CM

## 2024-05-13 DIAGNOSIS — M545 Low back pain, unspecified: Secondary | ICD-10-CM

## 2024-05-13 DIAGNOSIS — M5030 Other cervical disc degeneration, unspecified cervical region: Secondary | ICD-10-CM

## 2024-05-13 DIAGNOSIS — Z131 Encounter for screening for diabetes mellitus: Secondary | ICD-10-CM

## 2024-05-13 DIAGNOSIS — G8929 Other chronic pain: Secondary | ICD-10-CM

## 2024-05-13 DIAGNOSIS — H9313 Tinnitus, bilateral: Secondary | ICD-10-CM

## 2024-05-13 MED ORDER — PREDNISONE 20 MG PO TABS
40.0000 mg | ORAL_TABLET | Freq: Every day | ORAL | 0 refills | Status: AC
  Filled 2024-05-13: qty 10, 5d supply, fill #0

## 2024-05-13 MED ORDER — CYANOCOBALAMIN 1000 MCG/ML IJ SOLN
1000.0000 ug | Freq: Once | INTRAMUSCULAR | Status: AC
  Administered 2024-05-13: 1000 ug via INTRAMUSCULAR

## 2024-05-13 MED ORDER — TOPIRAMATE 100 MG PO TABS
100.0000 mg | ORAL_TABLET | Freq: Three times a day (TID) | ORAL | 3 refills | Status: AC
  Filled 2024-05-13: qty 90, 30d supply, fill #0
  Filled 2024-06-10: qty 90, 30d supply, fill #1
  Filled 2024-06-10: qty 90, 30d supply, fill #0
  Filled 2024-06-18: qty 90, 30d supply, fill #1

## 2024-05-13 MED ORDER — AMOXICILLIN-POT CLAVULANATE 875-125 MG PO TABS
1.0000 | ORAL_TABLET | Freq: Two times a day (BID) | ORAL | 0 refills | Status: AC
  Filled 2024-05-13: qty 20, 10d supply, fill #0

## 2024-05-14 LAB — CBC WITH DIFFERENTIAL/PLATELET
Basophils Absolute: 0.1 x10E3/uL (ref 0.0–0.2)
Basos: 1 %
EOS (ABSOLUTE): 0.2 x10E3/uL (ref 0.0–0.4)
Eos: 2 %
Hematocrit: 40.9 % (ref 34.0–46.6)
Hemoglobin: 13.5 g/dL (ref 11.1–15.9)
Immature Grans (Abs): 0 x10E3/uL (ref 0.0–0.1)
Immature Granulocytes: 0 %
Lymphocytes Absolute: 3.9 x10E3/uL — ABNORMAL HIGH (ref 0.7–3.1)
Lymphs: 45 %
MCH: 34.3 pg — ABNORMAL HIGH (ref 26.6–33.0)
MCHC: 33 g/dL (ref 31.5–35.7)
MCV: 104 fL — ABNORMAL HIGH (ref 79–97)
Monocytes Absolute: 0.7 x10E3/uL (ref 0.1–0.9)
Monocytes: 9 %
Neutrophils Absolute: 3.6 x10E3/uL (ref 1.4–7.0)
Neutrophils: 43 %
Platelets: 342 x10E3/uL (ref 150–450)
RBC: 3.94 x10E6/uL (ref 3.77–5.28)
RDW: 12.4 % (ref 11.7–15.4)
WBC: 8.5 x10E3/uL (ref 3.4–10.8)

## 2024-05-14 LAB — TSH+T4F+T3FREE
Free T4: 0.85 ng/dL (ref 0.82–1.77)
T3, Free: 2.4 pg/mL (ref 2.0–4.4)
TSH: 1.4 u[IU]/mL (ref 0.450–4.500)

## 2024-05-14 LAB — IRON,TIBC AND FERRITIN PANEL
Ferritin: 9 ng/mL — ABNORMAL LOW (ref 15–150)
Iron Saturation: 13 % — ABNORMAL LOW (ref 15–55)
Iron: 58 ug/dL (ref 27–159)
Total Iron Binding Capacity: 444 ug/dL (ref 250–450)
UIBC: 386 ug/dL (ref 131–425)

## 2024-05-14 LAB — CMP14+EGFR
ALT: 8 IU/L (ref 0–32)
AST: 16 IU/L (ref 0–40)
Albumin: 4.3 g/dL (ref 3.8–4.9)
Alkaline Phosphatase: 89 IU/L (ref 49–135)
BUN/Creatinine Ratio: 29 — ABNORMAL HIGH (ref 9–23)
BUN: 16 mg/dL (ref 6–24)
Bilirubin Total: 0.4 mg/dL (ref 0.0–1.2)
CO2: 24 mmol/L (ref 20–29)
Calcium: 9.1 mg/dL (ref 8.7–10.2)
Chloride: 103 mmol/L (ref 96–106)
Creatinine, Ser: 0.56 mg/dL — ABNORMAL LOW (ref 0.57–1.00)
Globulin, Total: 2.7 g/dL (ref 1.5–4.5)
Glucose: 77 mg/dL (ref 70–99)
Potassium: 4.3 mmol/L (ref 3.5–5.2)
Sodium: 140 mmol/L (ref 134–144)
Total Protein: 7 g/dL (ref 6.0–8.5)
eGFR: 106 mL/min/1.73 (ref >59)

## 2024-05-14 LAB — LIPID PANEL
Chol/HDL Ratio: 2.9 ratio (ref 0.0–4.4)
Cholesterol, Total: 228 mg/dL — ABNORMAL HIGH (ref 100–199)
HDL: 80 mg/dL (ref >39)
LDL Chol Calc (NIH): 127 mg/dL — ABNORMAL HIGH (ref 0–99)
Triglycerides: 119 mg/dL (ref 0–149)
VLDL Cholesterol Cal: 21 mg/dL (ref 5–40)

## 2024-05-14 LAB — VITAMIN B12: Vitamin B-12: 485 pg/mL (ref 232–1245)

## 2024-05-14 LAB — HEMOGLOBIN A1C
Est. average glucose Bld gHb Est-mCnc: 103 mg/dL
Hgb A1c MFr Bld: 5.2 % (ref 4.8–5.6)

## 2024-05-14 LAB — VITAMIN D 25 HYDROXY (VIT D DEFICIENCY, FRACTURES): Vit D, 25-Hydroxy: 91.5 ng/mL (ref 30.0–100.0)

## 2024-05-26 ENCOUNTER — Telehealth: Payer: Self-pay

## 2024-05-29 ENCOUNTER — Other Ambulatory Visit: Payer: Self-pay

## 2024-05-29 ENCOUNTER — Other Ambulatory Visit: Payer: Self-pay | Admitting: Family

## 2024-05-29 ENCOUNTER — Other Ambulatory Visit: Payer: Self-pay | Admitting: Cardiology

## 2024-05-29 MED ORDER — ACCRUFER 30 MG PO CAPS
30.0000 mg | ORAL_CAPSULE | Freq: Two times a day (BID) | ORAL | 1 refills | Status: AC
  Filled 2024-05-29: qty 60, 30d supply, fill #0

## 2024-05-29 NOTE — Telephone Encounter (Signed)
Pt informed

## 2024-05-30 ENCOUNTER — Other Ambulatory Visit: Payer: Self-pay

## 2024-05-30 ENCOUNTER — Other Ambulatory Visit: Payer: Self-pay | Admitting: Family

## 2024-05-30 MED ORDER — HYDROCOD POLI-CHLORPHE POLI ER 10-8 MG/5ML PO SUER: 5.0000 mL | Freq: Two times a day (BID) | ORAL | 0 refills | Status: AC | PRN

## 2024-05-30 MED FILL — Ibuprofen Tab 800 MG: ORAL | 30 days supply | Qty: 90 | Fill #0 | Status: AC

## 2024-06-04 NOTE — Assessment & Plan Note (Signed)
 Setting patient up for referral to Neurosurgery.  Will defer to them for further treatment changes.  Reassess at follow up.

## 2024-06-04 NOTE — Assessment & Plan Note (Signed)
 B12 injection given in office today.  Return for next injection per provider instructions.

## 2024-06-04 NOTE — Progress Notes (Signed)
 "  Acute Office Visit  Subjective:     Patient ID: Natasha Hanson, female    DOB: December 14, 1965, 58 y.o.   MRN: [id number]  Patient is in today for  Chief Complaint  Patient presents with   Acute Visit    Low back pain    Back Pain This is a chronic problem. The current episode started more than 1 year ago. The problem occurs intermittently. The problem has been waxing and waning since onset. The pain is present in the lumbar spine. The quality of the pain is described as shooting. The pain is The same all the time. The symptoms are aggravated by standing, sitting, bending, position and twisting. Stiffness is present All day. Risk factors include sedentary lifestyle. She has tried analgesics, bed rest, heat, home exercises, ice and NSAIDs for the symptoms. The treatment provided no relief.     Review of Systems  HENT:  Positive for ear pain and tinnitus.   Musculoskeletal:  Positive for back pain.  All other systems reviewed and are negative.       Objective:    BP 122/78   Pulse (!) 58   Ht 5' 3 (1.6 m)   Wt 103 lb 9.6 oz (47 kg)   SpO2 97%   BMI 18.35 kg/m   Physical Exam Vitals and nursing note reviewed.  Constitutional:      General: She is awake.     Appearance: Normal appearance. She is well-developed, well-groomed and normal weight.  HENT:     Head: Normocephalic.     Right Ear: Tenderness present. A middle ear effusion is present. Tympanic membrane is erythematous.     Left Ear: Tenderness present. A middle ear effusion is present. Tympanic membrane is erythematous.  Eyes:     Extraocular Movements: Extraocular movements intact.     Conjunctiva/sclera: Conjunctivae normal.     Pupils: Pupils are equal, round, and reactive to light.  Cardiovascular:     Rate and Rhythm: Normal rate.  Pulmonary:     Effort: Pulmonary effort is normal.  Neurological:     General: No focal deficit present.     Mental Status: She is alert and oriented to person, place, and time.  Mental status is at baseline.  Psychiatric:        Mood and Affect: Mood normal.        Behavior: Behavior normal. Behavior is cooperative.        Thought Content: Thought content normal.     No results found for any visits on 05/13/24.  Recent Results (from the past 2160 hours)  CMP14+EGFR     Status: Abnormal   Collection Time: 05/13/24  2:48 PM  Result Value Ref Range   Glucose 77 70 - 99 mg/dL   BUN 16 6 - 24 mg/dL   Creatinine, Ser 9.43 (L) 0.57 - 1.00 mg/dL   eGFR 893 >40 fO/fpw/8.26   BUN/Creatinine Ratio 29 (H) 9 - 23   Sodium 140 134 - 144 mmol/L   Potassium 4.3 3.5 - 5.2 mmol/L   Chloride 103 96 - 106 mmol/L   CO2 24 20 - 29 mmol/L   Calcium 9.1 8.7 - 10.2 mg/dL   Total Protein 7.0 6.0 - 8.5 g/dL   Albumin 4.3 3.8 - 4.9 g/dL   Globulin, Total 2.7 1.5 - 4.5 g/dL   Bilirubin Total 0.4 0.0 - 1.2 mg/dL   Alkaline Phosphatase 89 49 - 135 IU/L   AST 16 0 - 40 IU/L  ALT 8 0 - 32 IU/L  Lipid panel     Status: Abnormal   Collection Time: 05/13/24  2:48 PM  Result Value Ref Range   Cholesterol, Total 228 (H) 100 - 199 mg/dL   Triglycerides 880 0 - 149 mg/dL   HDL 80 >60 mg/dL   VLDL Cholesterol Cal 21 5 - 40 mg/dL   LDL Chol Calc (NIH) 872 (H) 0 - 99 mg/dL   Chol/HDL Ratio 2.9 0.0 - 4.4 ratio    Comment:                                   T. Chol/HDL Ratio                                             Men  Women                               1/2 Avg.Risk  3.4    3.3                                   Avg.Risk  5.0    4.4                                2X Avg.Risk  9.6    7.1                                3X Avg.Risk 23.4   11.0   Iron, TIBC and Ferritin Panel     Status: Abnormal   Collection Time: 05/13/24  2:48 PM  Result Value Ref Range   Total Iron Binding Capacity 444 250 - 450 ug/dL   UIBC 613 868 - 574 ug/dL   Iron 58 27 - 840 ug/dL   Iron Saturation 13 (L) 15 - 55 %   Ferritin 9 (L) 15 - 150 ng/mL  VITAMIN D  25 Hydroxy (Vit-D Deficiency, Fractures)      Status: None   Collection Time: 05/13/24  2:48 PM  Result Value Ref Range   Vit D, 25-Hydroxy 91.5 30.0 - 100.0 ng/mL    Comment: Vitamin D  deficiency has been defined by the Institute of Medicine and an Endocrine Society practice guideline as a level of serum 25-OH vitamin D  less than 20 ng/mL (1,2). The Endocrine Society went on to further define vitamin D  insufficiency as a level between 21 and 29 ng/mL (2). 1. IOM (Institute of Medicine). 2010. Dietary reference    intakes for calcium and D. [place]: The    [employer]. 2. Holick MF, Binkley [place], Bischoff-Ferrari HA, et al.    Evaluation, treatment, and prevention of vitamin D     deficiency: an Endocrine Society clinical practice    guideline. JCEM. 2011 Jul; 96(7):1911-30.   Vitamin B12     Status: None   Collection Time: 05/13/24  2:48 PM  Result Value Ref Range   Vitamin B-12 485 232 - 1,245 pg/mL  CBC with Diff     Status: Abnormal   Collection Time: 05/13/24  2:48  PM  Result Value Ref Range   WBC 8.5 3.4 - 10.8 x10E3/uL   RBC 3.94 3.77 - 5.28 x10E6/uL   Hemoglobin 13.5 11.1 - 15.9 g/dL   Hematocrit 59.0 65.9 - 46.6 %   MCV 104 (H) 79 - 97 fL   MCH 34.3 (H) 26.6 - 33.0 pg   MCHC 33.0 31.5 - 35.7 g/dL   RDW 87.5 88.2 - 84.5 %   Platelets 342 150 - 450 x10E3/uL   Neutrophils 43 Not Estab. %   Lymphs 45 Not Estab. %   Monocytes 9 Not Estab. %   Eos 2 Not Estab. %   Basos 1 Not Estab. %   Neutrophils Absolute 3.6 1.4 - 7.0 x10E3/uL   Lymphocytes Absolute 3.9 (H) 0.7 - 3.1 x10E3/uL   Monocytes Absolute 0.7 0.1 - 0.9 x10E3/uL   EOS (ABSOLUTE) 0.2 0.0 - 0.4 x10E3/uL   Basophils Absolute 0.1 0.0 - 0.2 x10E3/uL   Immature Granulocytes 0 Not Estab. %   Immature Grans (Abs) 0.0 0.0 - 0.1 x10E3/uL  Hemoglobin A1c     Status: None   Collection Time: 05/13/24  2:48 PM  Result Value Ref Range   Hgb A1c MFr Bld 5.2 4.8 - 5.6 %    Comment:          Prediabetes: 5.7 - 6.4          Diabetes: >6.4           Glycemic control for adults with diabetes: <7.0    Est. average glucose Bld gHb Est-mCnc 103 mg/dL  [id number]     Status: None   Collection Time: 05/13/24  2:48 PM  Result Value Ref Range   TSH 1.400 0.450 - 4.500 uIU/mL   T3, Free 2.4 2.0 - 4.4 pg/mL   Free T4 0.85 0.82 - 1.77 ng/dL    Allergies as of 87/12/7972       Reactions   No Known Allergies         Medication List        Accurate as of May 13, 2024 11:59 PM. If you have any questions, ask your nurse or doctor.          STOP taking these medications    meloxicam  7.5 MG tablet Commonly known as: Mobic  Stopped by: ALAN CHRISTELLA ARRANT   methylPREDNISolone  4 MG Tbpk tablet Commonly known as: MEDROL  DOSEPAK Stopped by: ALAN CHRISTELLA ARRANT   traZODone  50 MG tablet Commonly known as: DESYREL  Stopped by: ALAN CHRISTELLA ARRANT   valACYclovir  500 MG tablet Commonly known as: VALTREX  Stopped by: ALAN CHRISTELLA ARRANT       TAKE these medications    Advair  Diskus 100-50 MCG/ACT Aepb Generic drug: fluticasone -salmeterol Inhale 1 puff into the lungs 2 (two) times daily.   albuterol  1.25 MG/3ML nebulizer solution Commonly known as: ACCUNEB  Take 3 mLs by nebulization 4 (four) times daily for shortness of breath   Ventolin  HFA 108 (90 Base) MCG/ACT inhaler Generic drug: albuterol  Inhale 2-4 puffs into the lungs every 4 (four) hours as needed.   amoxicillin -clavulanate 875-125 MG tablet Commonly known as: AUGMENTIN  Take 1 tablet by mouth 2 (two) times daily. Started by: Lennyx Verdell M Ashonti Leandro   benzonatate  100 MG capsule Commonly known as: Tessalon  Perles Take 1 capsule (100 mg total) by mouth 3 (three) times daily as needed for cough.   buPROPion  150 MG 24 hr tablet Commonly known as: WELLBUTRIN  XL Take 1 tablet (150 mg total) by mouth daily.   busPIRone  5  MG tablet Commonly known as: BUSPAR  Take 1 tablet (5 mg total) by mouth 3 (three) times daily.   cetirizine  10 MG tablet Commonly known as: ZYRTEC  Take  10 mg by mouth daily.   cyanocobalamin  1000 MCG/ML injection Commonly known as: VITAMIN B12 Inject 1 mL (1,000 mcg total) into the muscle every 14 (fourteen) days.   famotidine  20 MG tablet Commonly known as: PEPCID  Take 1 tablet (20 mg total) by mouth daily.   fluorouracil  5 % cream Commonly known as: EFUDEX  Apply 1 Application topically to the nose 2 (two) times daily for four weeks. Starting December or January   fluticasone  50 MCG/ACT nasal spray Commonly known as: FLONASE  Place 2 sprays into both nostrils daily.   furosemide  20 MG tablet Commonly known as: Lasix  Take 1 tablet (20 mg total) by mouth daily for 3 days.   gabapentin  600 MG tablet Commonly known as: NEURONTIN  Take 1 tablet (600 mg total) by mouth 3 (three) times daily.   ibuprofen  800 MG tablet Commonly known as: ADVIL  Take 1 tablet (800 mg total) by mouth every 8 (eight) hours as needed.   Nurtec 75 MG Tbdp Generic drug: Rimegepant Sulfate  Take 1 tablet (75 mg total) by mouth as needed.   predniSONE  20 MG tablet Commonly known as: DELTASONE  Take 2 tablets (40 mg total) by mouth daily with breakfast. Started by: ALAN CHRISTELLA ARRANT   propranolol  20 MG tablet Commonly known as: INDERAL  Take 1 tablet (20 mg total) by mouth 2 (two) times daily.   tiZANidine  4 MG tablet Commonly known as: ZANAFLEX  Take 1 tablet (4 mg total) by mouth every 6 (six) hours as needed for muscle spasms.   topiramate  100 MG tablet Commonly known as: TOPAMAX  Take 1 tablet (100 mg total) by mouth 3 (three) times daily. What changed: when to take this Changed by: ALAN CHRISTELLA ARRANT   Vitamin D  (Ergocalciferol ) 1.25 MG (50000 UNIT) Caps capsule Commonly known as: DRISDOL  Take 1 capsule (50,000 Units total) by mouth every 7 (seven) days.   Zenpep  10000-32000 units Cpep Generic drug: Pancrelipase  (Lip-Prot-Amyl) Take 2 capsules by mouth 3 (three) times daily with meals AND 1 capsule with snacks.            Assessment &  Plan Low back pain, unspecified back pain laterality, unspecified chronicity, unspecified whether sciatica present Chronic sacroiliac joint pain (Bilateral) DDD (degenerative disc disease), cervical Cervical foraminal stenosis Chronic upper back pain Degeneration of intervertebral disc of lumbosacral region with discogenic back pain and lower extremity pain Setting patient up for referral to Neurosurgery.  Will defer to them for further treatment changes.  Reassess at follow up.  B12 deficiency due to diet B12 injection given in office today.  Return for next injection per provider instructions.  Eustachian tube dysfunction, bilateral Sending augmentin  and prednisone  RX for pt.  Also suggested she try flonase  to help with inflammation in her sinuses.     Return in about 2 months (around 07/14/2024).  Total time spent: 20 minutes  ALAN CHRISTELLA ARRANT, FNP  05/13/2024   This document may have been prepared by [facility] Voice Recognition software and as such may include unintentional dictation errors.  "

## 2024-06-08 ENCOUNTER — Encounter: Payer: Self-pay | Admitting: Family

## 2024-06-10 ENCOUNTER — Other Ambulatory Visit: Payer: Self-pay

## 2024-06-10 ENCOUNTER — Other Ambulatory Visit (HOSPITAL_COMMUNITY): Payer: Self-pay

## 2024-06-10 MED FILL — Gabapentin Tab 600 MG: ORAL | 30 days supply | Qty: 90 | Fill #0 | Status: AC

## 2024-06-10 MED FILL — Tizanidine HCl Tab 4 MG (Base Equivalent): ORAL | 30 days supply | Qty: 120 | Fill #0 | Status: AC

## 2024-06-10 MED FILL — Tizanidine HCl Tab 4 MG (Base Equivalent): ORAL | 30 days supply | Qty: 120 | Fill #1 | Status: CN

## 2024-06-10 MED FILL — Gabapentin Tab 600 MG: ORAL | 30 days supply | Qty: 90 | Fill #1 | Status: CN

## 2024-06-13 ENCOUNTER — Other Ambulatory Visit: Payer: Self-pay

## 2024-06-14 ENCOUNTER — Other Ambulatory Visit: Payer: Self-pay

## 2024-06-16 ENCOUNTER — Telehealth: Payer: Self-pay | Admitting: Family

## 2024-06-16 NOTE — Telephone Encounter (Signed)
 Patient left VM wanting to know if we can call her something in, she thinks she has the flu. She was recently around someone that was sick with the flu and now she is experiencing cough, chills, blowing nose. Please advise.

## 2024-06-17 ENCOUNTER — Ambulatory Visit: Admitting: Family

## 2024-06-17 ENCOUNTER — Other Ambulatory Visit: Payer: Self-pay

## 2024-06-17 DIAGNOSIS — J069 Acute upper respiratory infection, unspecified: Secondary | ICD-10-CM

## 2024-06-17 LAB — POCT XPERT XPRESS SARS COVID-2/FLU/RSV
FLU A: NEGATIVE
FLU B: NEGATIVE
RSV RNA, PCR: NEGATIVE
SARS Coronavirus 2: NEGATIVE

## 2024-06-17 MED ORDER — OSELTAMIVIR PHOSPHATE 75 MG PO CAPS
75.0000 mg | ORAL_CAPSULE | Freq: Two times a day (BID) | ORAL | 0 refills | Status: AC
  Filled 2024-06-17: qty 10, 5d supply, fill #0

## 2024-06-18 ENCOUNTER — Other Ambulatory Visit: Payer: Self-pay

## 2024-06-18 ENCOUNTER — Other Ambulatory Visit: Payer: Self-pay | Admitting: Family

## 2024-06-18 MED FILL — Tizanidine HCl Tab 4 MG (Base Equivalent): ORAL | 30 days supply | Qty: 120 | Fill #1 | Status: AC

## 2024-06-19 ENCOUNTER — Other Ambulatory Visit (HOSPITAL_COMMUNITY): Payer: Self-pay

## 2024-06-19 ENCOUNTER — Other Ambulatory Visit: Payer: Self-pay

## 2024-06-19 ENCOUNTER — Telehealth: Payer: Self-pay

## 2024-06-19 MED ORDER — AZITHROMYCIN 250 MG PO TABS
ORAL_TABLET | ORAL | 0 refills | Status: AC
  Filled 2024-06-19: qty 6, 5d supply, fill #0

## 2024-06-19 MED ORDER — GABAPENTIN 600 MG PO TABS
600.0000 mg | ORAL_TABLET | Freq: Three times a day (TID) | ORAL | 1 refills | Status: AC
  Filled 2024-06-19 (×2): qty 90, 30d supply, fill #0
  Filled ????-??-??: fill #0

## 2024-06-19 NOTE — Progress Notes (Unsigned)
" ° °  Subjective   CHIEF COMPLAINT  COVID Testing    REASON FOR VISIT  COVID testing for URI symptoms.        Objective   Results for orders placed or performed in visit on 06/17/24  POCT XPERT XPRESS SARS COVID-2/FLU/RSV  Result Value Ref Range   SARS Coronavirus 2 Negative    FLU A Negative    FLU B Negative    RSV RNA, PCR Negative     Assessment & Plan  1. Acute URI (Primary) Pt. With known exposure to flu.  Will send tamiflu  for pt given symptoms and high probability of influenza infection despite negative test.   - POCT XPERT XPRESS SARS COVID-2/FLU/RSV   Total time spent: 5 minutes  ALAN CHRISTELLA ARRANT, FNP 06/17/2024 "

## 2024-06-19 NOTE — Telephone Encounter (Signed)
 The patient LM at 11:54a asking for her gabapentin  to be refilled she states that she never received the order from the delivery people but they are claiming it was delivered.

## 2024-06-20 ENCOUNTER — Ambulatory Visit

## 2024-06-20 ENCOUNTER — Encounter: Payer: Self-pay | Admitting: Pharmacist

## 2024-06-20 ENCOUNTER — Ambulatory Visit (INDEPENDENT_AMBULATORY_CARE_PROVIDER_SITE_OTHER): Admitting: Vascular Surgery

## 2024-06-20 ENCOUNTER — Other Ambulatory Visit: Payer: Self-pay

## 2024-06-20 MED FILL — Albuterol Sulfate Inhal Aero 108 MCG/ACT (90MCG Base Equiv): RESPIRATORY_TRACT | 25 days supply | Qty: 6.7 | Fill #0 | Status: AC

## 2024-06-20 MED FILL — Fluticasone-Salmeterol Aer Powder BA 100-50 MCG/ACT: RESPIRATORY_TRACT | 30 days supply | Qty: 60 | Fill #2 | Status: AC

## 2024-06-22 ENCOUNTER — Other Ambulatory Visit: Payer: Self-pay

## 2024-06-24 NOTE — Telephone Encounter (Signed)
 Per alan this was already done
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
## 2024-07-04 ENCOUNTER — Other Ambulatory Visit: Payer: Self-pay

## 2024-07-04 ENCOUNTER — Inpatient Hospital Stay
Admission: RE | Admit: 2024-07-04 | Discharge: 2024-07-04 | Disposition: A | Payer: Self-pay | Source: Ambulatory Visit | Attending: Orthopedic Surgery | Admitting: Orthopedic Surgery

## 2024-07-04 DIAGNOSIS — Z049 Encounter for examination and observation for unspecified reason: Secondary | ICD-10-CM

## 2024-07-08 ENCOUNTER — Ambulatory Visit: Admitting: Orthopedic Surgery

## 2024-07-14 ENCOUNTER — Ambulatory Visit: Admitting: Family

## 2024-08-05 ENCOUNTER — Ambulatory Visit: Admitting: Orthopedic Surgery
